# Patient Record
Sex: Female | Born: 1950 | Race: Black or African American | Hispanic: No | Marital: Single | Smoking: Never smoker
Health system: Southern US, Community
[De-identification: ages and names within clinical notes are randomized; demographics above are authoritative.]

## PROBLEM LIST (undated history)

## (undated) DIAGNOSIS — M419 Scoliosis, unspecified: Secondary | ICD-10-CM

## (undated) DIAGNOSIS — J302 Other seasonal allergic rhinitis: Secondary | ICD-10-CM

## (undated) DIAGNOSIS — R000 Tachycardia, unspecified: Secondary | ICD-10-CM

## (undated) DIAGNOSIS — J45909 Unspecified asthma, uncomplicated: Secondary | ICD-10-CM

## (undated) DIAGNOSIS — E785 Hyperlipidemia, unspecified: Secondary | ICD-10-CM

## (undated) DIAGNOSIS — U071 COVID-19: Secondary | ICD-10-CM

## (undated) DIAGNOSIS — I10 Essential (primary) hypertension: Secondary | ICD-10-CM

## (undated) DIAGNOSIS — M549 Dorsalgia, unspecified: Secondary | ICD-10-CM

## (undated) DIAGNOSIS — I471 Supraventricular tachycardia, unspecified: Secondary | ICD-10-CM

## (undated) DIAGNOSIS — R51 Headache: Secondary | ICD-10-CM

## (undated) DIAGNOSIS — K219 Gastro-esophageal reflux disease without esophagitis: Secondary | ICD-10-CM

## (undated) DIAGNOSIS — M75102 Unspecified rotator cuff tear or rupture of left shoulder, not specified as traumatic: Secondary | ICD-10-CM

## (undated) DIAGNOSIS — R519 Headache, unspecified: Secondary | ICD-10-CM

## (undated) DIAGNOSIS — T7840XA Allergy, unspecified, initial encounter: Secondary | ICD-10-CM

## (undated) DIAGNOSIS — G8929 Other chronic pain: Secondary | ICD-10-CM

## (undated) HISTORY — PX: COLONOSCOPY: SHX174

## (undated) HISTORY — DX: Gastro-esophageal reflux disease without esophagitis: K21.9

## (undated) HISTORY — DX: Hyperlipidemia, unspecified: E78.5

## (undated) HISTORY — PX: CHOLECYSTECTOMY: SHX55

## (undated) HISTORY — DX: Supraventricular tachycardia, unspecified: I47.10

## (undated) HISTORY — DX: Supraventricular tachycardia: I47.1

## (undated) HISTORY — DX: Other seasonal allergic rhinitis: J30.2

## (undated) HISTORY — DX: Scoliosis, unspecified: M41.9

## (undated) HISTORY — DX: Allergy, unspecified, initial encounter: T78.40XA

## (undated) HISTORY — PX: POLYPECTOMY: SHX149

## (undated) HISTORY — PX: WISDOM TOOTH EXTRACTION: SHX21

## (undated) HISTORY — PX: TUBAL LIGATION: SHX77

## (undated) HISTORY — PX: ABDOMINAL HYSTERECTOMY: SHX81

---

## 1981-07-03 HISTORY — PX: TUBAL LIGATION: SHX77

## 1983-07-04 HISTORY — PX: ABDOMINAL HYSTERECTOMY: SHX81

## 1984-07-03 HISTORY — PX: CHOLECYSTECTOMY: SHX55

## 1998-03-15 ENCOUNTER — Encounter: Admission: RE | Admit: 1998-03-15 | Discharge: 1998-03-15 | Payer: Self-pay | Admitting: *Deleted

## 1999-07-22 ENCOUNTER — Encounter: Payer: Self-pay | Admitting: Emergency Medicine

## 1999-07-22 ENCOUNTER — Emergency Department (HOSPITAL_COMMUNITY): Admission: EM | Admit: 1999-07-22 | Discharge: 1999-07-22 | Payer: Self-pay | Admitting: Emergency Medicine

## 1999-10-01 ENCOUNTER — Emergency Department (HOSPITAL_COMMUNITY): Admission: EM | Admit: 1999-10-01 | Discharge: 1999-10-01 | Payer: Self-pay | Admitting: Emergency Medicine

## 1999-12-26 ENCOUNTER — Emergency Department (HOSPITAL_COMMUNITY): Admission: EM | Admit: 1999-12-26 | Discharge: 1999-12-26 | Payer: Self-pay | Admitting: Emergency Medicine

## 2000-08-05 ENCOUNTER — Encounter: Payer: Self-pay | Admitting: Emergency Medicine

## 2000-08-05 ENCOUNTER — Emergency Department (HOSPITAL_COMMUNITY): Admission: EM | Admit: 2000-08-05 | Discharge: 2000-08-05 | Payer: Self-pay | Admitting: Emergency Medicine

## 2001-03-11 ENCOUNTER — Emergency Department (HOSPITAL_COMMUNITY): Admission: EM | Admit: 2001-03-11 | Discharge: 2001-03-11 | Payer: Self-pay | Admitting: Emergency Medicine

## 2001-03-11 ENCOUNTER — Encounter: Payer: Self-pay | Admitting: Emergency Medicine

## 2002-07-16 ENCOUNTER — Other Ambulatory Visit: Admission: RE | Admit: 2002-07-16 | Discharge: 2002-07-16 | Payer: Self-pay | Admitting: Family Medicine

## 2007-11-07 ENCOUNTER — Emergency Department (HOSPITAL_COMMUNITY): Admission: EM | Admit: 2007-11-07 | Discharge: 2007-11-07 | Payer: Self-pay | Admitting: Emergency Medicine

## 2007-11-08 ENCOUNTER — Emergency Department (HOSPITAL_COMMUNITY): Admission: EM | Admit: 2007-11-08 | Discharge: 2007-11-08 | Payer: Self-pay | Admitting: Emergency Medicine

## 2007-11-20 ENCOUNTER — Ambulatory Visit (HOSPITAL_COMMUNITY): Admission: RE | Admit: 2007-11-20 | Discharge: 2007-11-20 | Payer: Self-pay | Admitting: Chiropractic Medicine

## 2007-12-25 ENCOUNTER — Encounter: Admission: RE | Admit: 2007-12-25 | Discharge: 2007-12-25 | Payer: Self-pay | Admitting: Chiropractic Medicine

## 2007-12-26 ENCOUNTER — Encounter: Admission: RE | Admit: 2007-12-26 | Discharge: 2007-12-26 | Payer: Self-pay | Admitting: Chiropractic Medicine

## 2008-01-16 ENCOUNTER — Ambulatory Visit (HOSPITAL_COMMUNITY): Admission: RE | Admit: 2008-01-16 | Discharge: 2008-01-16 | Payer: Self-pay | Admitting: Chiropractic Medicine

## 2008-04-05 ENCOUNTER — Emergency Department (HOSPITAL_COMMUNITY): Admission: EM | Admit: 2008-04-05 | Discharge: 2008-04-06 | Payer: Self-pay | Admitting: Emergency Medicine

## 2008-05-07 ENCOUNTER — Emergency Department (HOSPITAL_COMMUNITY): Admission: EM | Admit: 2008-05-07 | Discharge: 2008-05-07 | Payer: Self-pay | Admitting: *Deleted

## 2008-08-09 ENCOUNTER — Emergency Department (HOSPITAL_COMMUNITY): Admission: EM | Admit: 2008-08-09 | Discharge: 2008-08-09 | Payer: Self-pay | Admitting: Emergency Medicine

## 2008-12-21 ENCOUNTER — Ambulatory Visit (HOSPITAL_COMMUNITY): Admission: RE | Admit: 2008-12-21 | Discharge: 2008-12-21 | Payer: Self-pay | Admitting: Internal Medicine

## 2009-04-30 ENCOUNTER — Emergency Department (HOSPITAL_COMMUNITY): Admission: EM | Admit: 2009-04-30 | Discharge: 2009-04-30 | Payer: Self-pay | Admitting: Family Medicine

## 2009-10-05 ENCOUNTER — Emergency Department (HOSPITAL_COMMUNITY): Admission: EM | Admit: 2009-10-05 | Discharge: 2009-10-05 | Payer: Self-pay | Admitting: Emergency Medicine

## 2009-10-16 ENCOUNTER — Emergency Department (HOSPITAL_COMMUNITY): Admission: EM | Admit: 2009-10-16 | Discharge: 2009-10-16 | Payer: Self-pay | Admitting: Emergency Medicine

## 2009-12-27 ENCOUNTER — Ambulatory Visit (HOSPITAL_COMMUNITY): Admission: RE | Admit: 2009-12-27 | Discharge: 2009-12-27 | Payer: Self-pay | Admitting: Internal Medicine

## 2010-03-18 ENCOUNTER — Ambulatory Visit: Payer: Self-pay | Admitting: Family Medicine

## 2010-03-18 LAB — CONVERTED CEMR LAB
ALT: 15 units/L (ref 0–35)
AST: 13 units/L (ref 0–37)
Albumin: 4.4 g/dL (ref 3.5–5.2)
Alkaline Phosphatase: 129 units/L — ABNORMAL HIGH (ref 39–117)
BUN: 10 mg/dL (ref 6–23)
Basophils Absolute: 0 10*3/uL (ref 0.0–0.1)
Basophils Relative: 0 % (ref 0–1)
CO2: 29 meq/L (ref 19–32)
Calcium: 9.7 mg/dL (ref 8.4–10.5)
Chloride: 105 meq/L (ref 96–112)
Cholesterol: 192 mg/dL (ref 0–200)
Creatinine, Ser: 0.65 mg/dL (ref 0.40–1.20)
Eosinophils Absolute: 0.1 10*3/uL (ref 0.0–0.7)
Eosinophils Relative: 1 % (ref 0–5)
Glucose, Bld: 84 mg/dL (ref 70–99)
HCT: 39.5 % (ref 36.0–46.0)
HDL: 38 mg/dL — ABNORMAL LOW (ref >39)
Hemoglobin: 12.4 g/dL (ref 12.0–15.0)
LDL Cholesterol: 121 mg/dL — ABNORMAL HIGH (ref 0–99)
Lymphocytes Relative: 29 % (ref 12–46)
Lymphs Abs: 2 10*3/uL (ref 0.7–4.0)
MCHC: 31.4 g/dL (ref 30.0–36.0)
MCV: 90.8 fL (ref 78.0–100.0)
Monocytes Absolute: 0.5 10*3/uL (ref 0.1–1.0)
Monocytes Relative: 7 % (ref 3–12)
Neutro Abs: 4.4 10*3/uL (ref 1.7–7.7)
Neutrophils Relative %: 63 % (ref 43–77)
Platelets: 237 10*3/uL (ref 150–400)
Potassium: 4.6 meq/L (ref 3.5–5.3)
RBC: 4.35 M/uL (ref 3.87–5.11)
RDW: 14.2 % (ref 11.5–15.5)
Sodium: 143 meq/L (ref 135–145)
Total Bilirubin: 0.3 mg/dL (ref 0.3–1.2)
Total CHOL/HDL Ratio: 5.1
Total Protein: 7.3 g/dL (ref 6.0–8.3)
Triglycerides: 163 mg/dL — ABNORMAL HIGH (ref <150)
VLDL: 33 mg/dL (ref 0–40)
WBC: 7 10*3/uL (ref 4.0–10.5)

## 2010-03-25 ENCOUNTER — Ambulatory Visit (HOSPITAL_COMMUNITY): Admission: RE | Admit: 2010-03-25 | Discharge: 2010-03-25 | Payer: Self-pay | Admitting: Family Medicine

## 2010-05-13 ENCOUNTER — Ambulatory Visit (HOSPITAL_COMMUNITY)
Admission: RE | Admit: 2010-05-13 | Discharge: 2010-05-13 | Payer: Self-pay | Source: Home / Self Care | Admitting: Family Medicine

## 2010-10-18 LAB — POCT CARDIAC MARKERS
CKMB, poc: <1 ng/mL — ABNORMAL LOW (ref 1.0–8.0)
Myoglobin, poc: 84.4 ng/mL (ref 12–200)
Troponin i, poc: <0.05 ng/mL (ref 0.00–0.09)

## 2010-10-18 LAB — POCT I-STAT, CHEM 8
BUN: 8 mg/dL (ref 6–23)
Calcium, Ion: 1.17 mmol/L (ref 1.12–1.32)
Chloride: 103 mEq/L (ref 96–112)
Creatinine, Ser: 0.8 mg/dL (ref 0.4–1.2)
Glucose, Bld: 94 mg/dL (ref 70–99)
HCT: 39 % (ref 36.0–46.0)
Hemoglobin: 13.3 g/dL (ref 12.0–15.0)
Potassium: 4 mEq/L (ref 3.5–5.1)
Sodium: 142 mEq/L (ref 135–145)
TCO2: 30 mmol/L (ref 0–100)

## 2010-10-18 LAB — DIFFERENTIAL
Basophils Absolute: 0 10*3/uL (ref 0.0–0.1)
Basophils Relative: 0 % (ref 0–1)
Eosinophils Absolute: 0.2 10*3/uL (ref 0.0–0.7)
Eosinophils Relative: 2 % (ref 0–5)
Lymphocytes Relative: 26 % (ref 12–46)
Lymphs Abs: 2.9 10*3/uL (ref 0.7–4.0)
Monocytes Absolute: 0.8 10*3/uL (ref 0.1–1.0)
Monocytes Relative: 7 % (ref 3–12)
Neutro Abs: 7.3 10*3/uL (ref 1.7–7.7)
Neutrophils Relative %: 65 % (ref 43–77)

## 2010-10-18 LAB — CBC
HCT: 38.1 % (ref 36.0–46.0)
Hemoglobin: 12.9 g/dL (ref 12.0–15.0)
MCHC: 33.9 g/dL (ref 30.0–36.0)
MCV: 87.6 fL (ref 78.0–100.0)
Platelets: 228 10*3/uL (ref 150–400)
RBC: 4.35 MIL/uL (ref 3.87–5.11)
RDW: 13.2 % (ref 11.5–15.5)
WBC: 11.2 10*3/uL — ABNORMAL HIGH (ref 4.0–10.5)

## 2010-10-18 LAB — D-DIMER, QUANTITATIVE: D-Dimer, Quant: 0.32 ug/mL-FEU (ref 0.00–0.48)

## 2010-11-25 ENCOUNTER — Other Ambulatory Visit (HOSPITAL_COMMUNITY): Payer: Self-pay | Admitting: Family Medicine

## 2010-11-25 DIAGNOSIS — Z1231 Encounter for screening mammogram for malignant neoplasm of breast: Secondary | ICD-10-CM

## 2010-12-29 ENCOUNTER — Ambulatory Visit (HOSPITAL_COMMUNITY)
Admission: RE | Admit: 2010-12-29 | Discharge: 2010-12-29 | Disposition: A | Discharge status: Home / Self Care | Payer: Self-pay | Source: Ambulatory Visit | Attending: Family Medicine | Admitting: Family Medicine

## 2010-12-29 ENCOUNTER — Ambulatory Visit (HOSPITAL_COMMUNITY): Payer: Self-pay

## 2010-12-29 DIAGNOSIS — Z1231 Encounter for screening mammogram for malignant neoplasm of breast: Secondary | ICD-10-CM | POA: Insufficient documentation

## 2011-02-01 ENCOUNTER — Ambulatory Visit (HOSPITAL_COMMUNITY)
Admission: RE | Admit: 2011-02-01 | Discharge: 2011-02-01 | Disposition: A | Discharge status: Home / Self Care | Payer: Self-pay | Source: Ambulatory Visit | Attending: Gastroenterology | Admitting: Gastroenterology

## 2011-02-01 ENCOUNTER — Other Ambulatory Visit: Payer: Self-pay | Admitting: Gastroenterology

## 2011-02-01 DIAGNOSIS — R109 Unspecified abdominal pain: Secondary | ICD-10-CM | POA: Insufficient documentation

## 2011-02-01 DIAGNOSIS — Z83719 Family history of colon polyps, unspecified: Secondary | ICD-10-CM | POA: Insufficient documentation

## 2011-02-01 DIAGNOSIS — Z8371 Family history of colonic polyps: Secondary | ICD-10-CM | POA: Insufficient documentation

## 2011-02-01 DIAGNOSIS — K644 Residual hemorrhoidal skin tags: Secondary | ICD-10-CM | POA: Insufficient documentation

## 2011-02-01 DIAGNOSIS — D126 Benign neoplasm of colon, unspecified: Secondary | ICD-10-CM | POA: Insufficient documentation

## 2011-02-01 DIAGNOSIS — R195 Other fecal abnormalities: Secondary | ICD-10-CM | POA: Insufficient documentation

## 2011-04-05 LAB — BASIC METABOLIC PANEL
BUN: 8
CO2: 27
Calcium: 9.3
Chloride: 103
Creatinine, Ser: 0.73
GFR calc Af Amer: >60
GFR calc non Af Amer: >60
Glucose, Bld: 106 — ABNORMAL HIGH
Potassium: 3.3 — ABNORMAL LOW
Sodium: 139

## 2011-04-05 LAB — DIFFERENTIAL
Basophils Absolute: 0
Basophils Relative: 0
Eosinophils Absolute: 0.3
Eosinophils Relative: 3
Lymphocytes Relative: 30
Lymphs Abs: 2.6
Monocytes Absolute: 0.5
Monocytes Relative: 6
Neutro Abs: 5.1
Neutrophils Relative %: 61

## 2011-04-05 LAB — CBC
HCT: 36.8
Hemoglobin: 12.7
MCHC: 34.4
MCV: 86.8
Platelets: ADEQUATE
RBC: 4.24
RDW: 13.9
WBC: 8.5

## 2011-04-05 LAB — POCT CARDIAC MARKERS
CKMB, poc: 1.1
CKMB, poc: <1 — ABNORMAL LOW
Myoglobin, poc: 49.5
Myoglobin, poc: 68.9
Troponin i, poc: <0.05
Troponin i, poc: <0.05

## 2011-11-21 ENCOUNTER — Other Ambulatory Visit (HOSPITAL_COMMUNITY): Payer: Self-pay | Admitting: Family Medicine

## 2011-11-21 DIAGNOSIS — Z1231 Encounter for screening mammogram for malignant neoplasm of breast: Secondary | ICD-10-CM

## 2012-01-08 ENCOUNTER — Ambulatory Visit (HOSPITAL_COMMUNITY): Payer: Self-pay

## 2012-01-19 ENCOUNTER — Other Ambulatory Visit (HOSPITAL_COMMUNITY): Payer: Self-pay | Admitting: Family Medicine

## 2012-01-19 ENCOUNTER — Ambulatory Visit (HOSPITAL_COMMUNITY)
Admission: RE | Admit: 2012-01-19 | Discharge: 2012-01-19 | Disposition: A | Discharge status: Home / Self Care | Payer: Self-pay | Source: Ambulatory Visit | Attending: Family Medicine | Admitting: Family Medicine

## 2012-01-19 DIAGNOSIS — Z1231 Encounter for screening mammogram for malignant neoplasm of breast: Secondary | ICD-10-CM | POA: Insufficient documentation

## 2012-02-25 ENCOUNTER — Emergency Department (HOSPITAL_COMMUNITY)
Admission: EM | Admit: 2012-02-25 | Discharge: 2012-02-25 | Disposition: A | Discharge status: Home / Self Care | Payer: Self-pay | Attending: Emergency Medicine | Admitting: Emergency Medicine

## 2012-02-25 ENCOUNTER — Emergency Department (HOSPITAL_COMMUNITY): Payer: Self-pay

## 2012-02-25 ENCOUNTER — Encounter (HOSPITAL_COMMUNITY): Payer: Self-pay | Admitting: Emergency Medicine

## 2012-02-25 DIAGNOSIS — R071 Chest pain on breathing: Secondary | ICD-10-CM | POA: Insufficient documentation

## 2012-02-25 DIAGNOSIS — M79609 Pain in unspecified limb: Secondary | ICD-10-CM | POA: Insufficient documentation

## 2012-02-25 DIAGNOSIS — R0789 Other chest pain: Secondary | ICD-10-CM

## 2012-02-25 DIAGNOSIS — R000 Tachycardia, unspecified: Secondary | ICD-10-CM | POA: Insufficient documentation

## 2012-02-25 DIAGNOSIS — M549 Dorsalgia, unspecified: Secondary | ICD-10-CM | POA: Insufficient documentation

## 2012-02-25 HISTORY — DX: Tachycardia, unspecified: R00.0

## 2012-02-25 LAB — BASIC METABOLIC PANEL
BUN: 9 mg/dL (ref 6–23)
CO2: 28 mEq/L (ref 19–32)
Calcium: 9.7 mg/dL (ref 8.4–10.5)
Chloride: 103 mEq/L (ref 96–112)
Creatinine, Ser: 0.56 mg/dL (ref 0.50–1.10)
GFR calc Af Amer: >90 mL/min (ref >90)
GFR calc non Af Amer: >90 mL/min (ref >90)
Glucose, Bld: 95 mg/dL (ref 70–99)
Potassium: 3.5 mEq/L (ref 3.5–5.1)
Sodium: 142 mEq/L (ref 135–145)

## 2012-02-25 LAB — CBC
HCT: 38.6 % (ref 36.0–46.0)
Hemoglobin: 12.7 g/dL (ref 12.0–15.0)
MCH: 28.4 pg (ref 26.0–34.0)
MCHC: 32.9 g/dL (ref 30.0–36.0)
MCV: 86.4 fL (ref 78.0–100.0)
Platelets: 256 10*3/uL (ref 150–400)
RBC: 4.47 MIL/uL (ref 3.87–5.11)
RDW: 13.7 % (ref 11.5–15.5)
WBC: 7.2 10*3/uL (ref 4.0–10.5)

## 2012-02-25 LAB — POCT I-STAT TROPONIN I: Troponin i, poc: 0 ng/mL (ref 0.00–0.08)

## 2012-02-25 MED ORDER — HYDROCODONE-ACETAMINOPHEN 5-325 MG PO TABS: 2.0000 | ORAL_TABLET | Freq: Four times a day (QID) | ORAL | Status: AC | PRN

## 2012-02-25 NOTE — ED Provider Notes (Signed)
History     CSN: [id number]  Arrival date & time 02/25/12  1059   First MD Initiated Contact with Patient 02/25/12 1358      Chief Complaint  Patient presents with  . Chest Pain  . Arm Pain  . Back Pain    (Consider location/radiation/quality/duration/timing/severity/associated sxs/prior treatment) HPI This 61 year old female has several weeks of low back pain nonradiating without associated weakness numbness or change in bowel or bladder function. She now presents to the emergency department with a few days of constant 24-hour a day right chest wall pain and tenderness worse with palpation and torso movement which is nonexertional and nonpleuritic without cough or shortness of breath. She is no fever or trauma or rash. She also has a couple of weeks of constant 24 hour day right arm pain without weakness or numbness redness swelling or deformity. The pain is not worse with movement.  She is no headache no change in speech vision swallowing or understanding and no lateralizing weakness numbness. She is no abdominal pain vomiting diarrhea or bloody stools. Her chest pain is sharp as well as pressure-like nonradiating and without associated symptoms. Past Medical History  Diagnosis Date  . Tachycardia     History reviewed. No pertinent past surgical history.  History reviewed. No pertinent family history.  History  Substance Use Topics  . Smoking status: Never Smoker   . Smokeless tobacco: Not on file  . Alcohol Use: No    OB History    Grav Para Term Preterm Abortions TAB SAB Ect Mult Living                  Review of Systems 10 Systems reviewed and are negative for acute change except as noted in the HPI. Allergies  Review of patient's allergies indicates no known allergies.  Home Medications   Current Outpatient Rx  Name Route Sig Dispense Refill  . HYDROCHLOROTHIAZIDE 25 MG PO TABS Oral Take 25 mg by mouth daily.    Marland Kitchen METOPROLOL SUCCINATE ER 50 MG PO TB24 Oral  Take 25 mg by mouth daily. Take with or immediately following a meal.    . HYDROCODONE-ACETAMINOPHEN 5-325 MG PO TABS Oral Take 2 tablets by mouth every 6 (six) hours as needed for pain. 15 tablet 0    BP 150/101  Pulse 67  Temp 97.8 F (36.6 C) (Oral)  Resp 21  SpO2 100%  Physical Exam  Nursing note and vitals reviewed. Constitutional:       Awake, alert, nontoxic appearance with baseline speech for patient.  HENT:  Head: Atraumatic.  Mouth/Throat: No oropharyngeal exudate.  Eyes: EOM are normal. Pupils are equal, round, and reactive to light. Right eye exhibits no discharge. Left eye exhibits no discharge.  Neck: Neck supple.  Cardiovascular: Normal rate and regular rhythm.   No murmur heard. Pulmonary/Chest: Effort normal and breath sounds normal. No stridor. No respiratory distress. She has no wheezes. She has no rales. She exhibits tenderness.       Reproducible right lateral chest wall tenderness without deformity  Abdominal: Soft. Bowel sounds are normal. She exhibits no mass. There is no tenderness. There is no rebound.  Musculoskeletal: She exhibits no edema and no tenderness.       Baseline ROM, moves extremities with no obvious new focal weakness. Minimal bilateral paralumbar tenderness without midline back tenderness.  Lymphadenopathy:    She has no cervical adenopathy.  Neurological:       Awake, alert, cooperative and aware of  situation; motor strength bilaterally; sensation normal to light touch bilaterally; peripheral visual fields full to confrontation; no facial asymmetry; tongue midline; major cranial nerves appear intact; no pronator drift, normal finger to nose bilaterally  Skin: No rash noted.  Psychiatric: She has a normal mood and affect.    ED Course  Procedures (including critical care time)   Labs Reviewed  CBC  BASIC METABOLIC PANEL  POCT I-STAT TROPONIN I  LAB REPORT - SCANNED   No results found.   1. Chest wall pain       MDM  I  doubt any other EMC precluding discharge at this time including, but not necessarily limited to the following:ACS, PE.        Hurman Horn, MD 02/29/12 1110

## 2012-02-25 NOTE — ED Notes (Signed)
Pt c/o midsternal CP and pain in mid and lower back on right side all worse with palpation; pt sts pain in right arm all going on x 2 days

## 2012-07-03 HISTORY — PX: BREAST CYST ASPIRATION: SHX578

## 2012-08-28 ENCOUNTER — Emergency Department (HOSPITAL_COMMUNITY)
Admission: EM | Admit: 2012-08-28 | Discharge: 2012-08-28 | Disposition: A | Discharge status: Home / Self Care | Payer: Worker's Compensation | Attending: Emergency Medicine | Admitting: Emergency Medicine

## 2012-08-28 ENCOUNTER — Encounter (HOSPITAL_COMMUNITY): Payer: Self-pay | Admitting: Emergency Medicine

## 2012-08-28 DIAGNOSIS — T07XXXA Unspecified multiple injuries, initial encounter: Secondary | ICD-10-CM

## 2012-08-28 DIAGNOSIS — W010XXA Fall on same level from slipping, tripping and stumbling without subsequent striking against object, initial encounter: Secondary | ICD-10-CM | POA: Insufficient documentation

## 2012-08-28 DIAGNOSIS — Y9301 Activity, walking, marching and hiking: Secondary | ICD-10-CM | POA: Insufficient documentation

## 2012-08-28 DIAGNOSIS — Z8679 Personal history of other diseases of the circulatory system: Secondary | ICD-10-CM | POA: Insufficient documentation

## 2012-08-28 DIAGNOSIS — S0003XA Contusion of scalp, initial encounter: Secondary | ICD-10-CM | POA: Insufficient documentation

## 2012-08-28 DIAGNOSIS — Y9269 Other specified industrial and construction area as the place of occurrence of the external cause: Secondary | ICD-10-CM | POA: Insufficient documentation

## 2012-08-28 DIAGNOSIS — Z79899 Other long term (current) drug therapy: Secondary | ICD-10-CM | POA: Insufficient documentation

## 2012-08-28 DIAGNOSIS — Z23 Encounter for immunization: Secondary | ICD-10-CM | POA: Insufficient documentation

## 2012-08-28 DIAGNOSIS — S0083XA Contusion of other part of head, initial encounter: Secondary | ICD-10-CM | POA: Insufficient documentation

## 2012-08-28 DIAGNOSIS — IMO0002 Reserved for concepts with insufficient information to code with codable children: Secondary | ICD-10-CM | POA: Insufficient documentation

## 2012-08-28 DIAGNOSIS — Y990 Civilian activity done for income or pay: Secondary | ICD-10-CM | POA: Insufficient documentation

## 2012-08-28 MED ORDER — TETANUS-DIPHTH-ACELL PERTUSSIS 5-2.5-18.5 LF-MCG/0.5 IM SUSP
0.5000 mL | Freq: Once | INTRAMUSCULAR | Status: AC
  Administered 2012-08-28: 0.5 mL via INTRAMUSCULAR
  Filled 2012-08-28: qty 0.5

## 2012-08-28 NOTE — ED Notes (Signed)
Fell at work this am-- tripped over feet-- no loss of consciousness-- hit forehead, abrasion to left eyebrow, left nasal area, left side of lip, left knee-

## 2012-08-28 NOTE — ED Provider Notes (Signed)
History  This chart was scribed for non-physician practitioner working with Richardean Canal, MD by Ardeen Jourdain, ED Scribe. This patient was seen in room TR06C/TR06C and the patient's care was started at 1620.  CSN: [id number]  Arrival date & time 08/28/12  1547   None     Chief Complaint  Patient presents with  . Fall     Patient is a 62 y.o. female presenting with fall. The history is provided by the patient. No language interpreter was used.  Fall The accident occurred 3 to 5 hours ago. The fall occurred while walking. She fell from a height of 1 to 2 ft. She landed on a hard floor. There was no blood loss. The point of impact was the head and left knee. The pain is present in the head and left knee. The pain is mild. She was ambulatory at the scene. There was no entrapment after the fall. There was no drug use involved in the accident. There was no alcohol use involved in the accident. Pertinent negatives include no visual change, no fever, no abdominal pain, no bowel incontinence, no nausea, no vomiting, no headaches, no loss of consciousness and no tingling. She has tried nothing for the symptoms.    Emily Clarke is a 62 y.o. female with a h/o tachycardia who presents to the Emergency Department complaining of a fall at work. She states she "tripped over her feet" and hit a wall. She is c/o abrasions to the left eyebrow, left nasal area, left lip, and left knee. She states she is unsure of her last tetanus vaccination.    Past Medical History  Diagnosis Date  . Tachycardia     Past Surgical History  Procedure Laterality Date  . Abdominal hysterectomy    . Cholecystectomy      No family history on file.  History  Substance Use Topics  . Smoking status: Never Smoker   . Smokeless tobacco: Not on file  . Alcohol Use: No   No OB history available.   Review of Systems  Constitutional: Negative for fever.  Gastrointestinal: Negative for nausea, vomiting, abdominal pain  and bowel incontinence.  Skin: Positive for wound.  Neurological: Negative for tingling, loss of consciousness and headaches.  All other systems reviewed and are negative.    Allergies  Review of patient's allergies indicates no known allergies.  Home Medications   Current Outpatient Rx  Name  Route  Sig  Dispense  Refill  . hydrochlorothiazide (HYDRODIURIL) 25 MG tablet   Oral   Take 12.5 mg by mouth daily.          . metoprolol succinate (TOPROL-XL) 25 MG 24 hr tablet   Oral   Take 25 mg by mouth daily.         Marland Kitchen Phenyleph-CPM-DM-Aspirin (ALKA-SELTZER PLUS COLD & COUGH PO)   Oral   Take 2 tablets by mouth daily as needed (cold like symptoms).           Triage Vitals: BP 131/90  Pulse 83  Temp(Src) 98.3 F (36.8 C) (Oral)  Resp 18  SpO2 96%  Physical Exam  Nursing note and vitals reviewed. Constitutional: She is oriented to person, place, and time. She appears well-developed and well-nourished. No distress.  HENT:  Head: Normocephalic and atraumatic.  Right Ear: External ear normal.  Left Ear: External ear normal.  Eyes: EOM are normal. Pupils are equal, round, and reactive to light.  Neck: Normal range of motion. Neck supple. No  tracheal deviation present.  Cardiovascular: Normal rate, regular rhythm and normal heart sounds.   Pulmonary/Chest: Effort normal and breath sounds normal. No respiratory distress. She exhibits no tenderness.  Abdominal: Soft. She exhibits no distension. There is no tenderness.  Musculoskeletal: Normal range of motion. She exhibits no edema.  Neurological: She is alert and oriented to person, place, and time.  Skin: Skin is warm and dry. She is not diaphoretic.  Superficial abrasions to left eyebrow and left nares, contusion to left lower lip, superficial abrasion to left knee  Psychiatric: She has a normal mood and affect. Her behavior is normal.    ED Course  Procedures (including critical care time)  DIAGNOSTIC  STUDIES: Oxygen Saturation is 96% on room air, adequate by my interpretation.    COORDINATION OF CARE:  4:33 PM: Discussed treatment plan which includes ice and home care with pt at bedside and pt agreed to plan.     Labs Reviewed - No data to display No results found.   No diagnosis found.  Fall from a stumble at work.  No loss of consciousness or dizziness.  Multiple abrasions.  Last tetanus unknown, updated today.  MDM        I personally performed the services described in this documentation, which was scribed in my presence. The recorded information has been reviewed and is accurate.    Jimmye Norman, NP 08/28/12 1650

## 2012-08-28 NOTE — ED Notes (Signed)
Pt presents for evaluation of a fall that happened around 11am today, pt tripped over feet and fell onto concrete.  Denies LOC.  Abrasion noted above left eye, bleeding controlled.  Abrasion noted to left knee, bleeding controlled.  Pt able to move all extremities well and answering questions appropriately.  Will continue to monitor.

## 2012-08-28 NOTE — ED Provider Notes (Signed)
Medical screening examination/treatment/procedure(s) were performed by non-physician practitioner and as supervising physician I was immediately available for consultation/collaboration.   Ahmani Daoud H Johann Gascoigne, MD 08/28/12 2244 

## 2012-09-15 ENCOUNTER — Encounter (HOSPITAL_COMMUNITY): Payer: Self-pay | Admitting: Family Medicine

## 2012-09-15 ENCOUNTER — Emergency Department (HOSPITAL_COMMUNITY)
Admission: EM | Admit: 2012-09-15 | Discharge: 2012-09-15 | Disposition: A | Discharge status: Home / Self Care | Payer: Self-pay | Attending: Emergency Medicine | Admitting: Emergency Medicine

## 2012-09-15 DIAGNOSIS — Z79899 Other long term (current) drug therapy: Secondary | ICD-10-CM | POA: Insufficient documentation

## 2012-09-15 DIAGNOSIS — Z8679 Personal history of other diseases of the circulatory system: Secondary | ICD-10-CM | POA: Insufficient documentation

## 2012-09-15 DIAGNOSIS — R059 Cough, unspecified: Secondary | ICD-10-CM | POA: Insufficient documentation

## 2012-09-15 DIAGNOSIS — Z7982 Long term (current) use of aspirin: Secondary | ICD-10-CM | POA: Insufficient documentation

## 2012-09-15 DIAGNOSIS — R21 Rash and other nonspecific skin eruption: Secondary | ICD-10-CM | POA: Insufficient documentation

## 2012-09-15 DIAGNOSIS — B029 Zoster without complications: Secondary | ICD-10-CM | POA: Insufficient documentation

## 2012-09-15 MED ORDER — ACYCLOVIR 800 MG PO TABS: 400.0000 mg | ORAL_TABLET | Freq: Every day | ORAL | Status: DC

## 2012-09-15 MED ORDER — HYDROCODONE-ACETAMINOPHEN 5-325 MG PO TABS: 1.0000 | ORAL_TABLET | ORAL | Status: DC | PRN

## 2012-09-15 NOTE — ED Provider Notes (Signed)
Medical screening examination/treatment/procedure(s) were performed by non-physician practitioner and as supervising physician I was immediately available for consultation/collaboration.  Lauralye Kinn L Anika Shore, MD 09/15/12 1702 

## 2012-09-15 NOTE — ED Notes (Signed)
Per pt sts right side of face pain and swelling. sts started with a cold sore. sts also cough. sts hurts to swollow

## 2012-09-15 NOTE — ED Provider Notes (Signed)
History     CSN: [id number]  Arrival date & time 09/15/12  0945   None     Chief Complaint  Patient presents with  . Facial Swelling    (Consider location/radiation/quality/duration/timing/severity/associated sxs/prior treatment) HPI  Patient presents to the emergency department with complaints of pain to right side of face with rash. She feels that she is having swelling and a cold sore. She admits to having a cough in the past 2 days as well and pain going towards her ear and throat. No neck pain, no fevers, weight loss, N/V/D, SOB, CP. She is a [facility] patient and has not been seeing her PCP because she does not have insurance.   Past Medical History  Diagnosis Date  . Tachycardia     Past Surgical History  Procedure Laterality Date  . Abdominal hysterectomy    . Cholecystectomy      History reviewed. No pertinent family history.  History  Substance Use Topics  . Smoking status: Never Smoker   . Smokeless tobacco: Not on file  . Alcohol Use: No    OB History   Grav Para Term Preterm Abortions TAB SAB Ect Mult Living                  Review of Systems  All other systems reviewed and are negative.    Allergies  Review of patient's allergies indicates no known allergies.  Home Medications   Current Outpatient Rx  Name  Route  Sig  Dispense  Refill  . aspirin EC 81 MG tablet   Oral   Take 81 mg by mouth daily.         . hydrochlorothiazide (HYDRODIURIL) 25 MG tablet   Oral   Take 12.5 mg by mouth daily.          . metoprolol succinate (TOPROL-XL) 25 MG 24 hr tablet   Oral   Take 25 mg by mouth daily.         Marland Kitchen acyclovir (ZOVIRAX) 800 MG tablet   Oral   Take 0.5 tablets (400 mg total) by mouth 5 (five) times daily.   35 tablet   0   . HYDROcodone-acetaminophen (NORCO/VICODIN) 5-325 MG per tablet   Oral   Take 1 tablet by mouth every 4 (four) hours as needed for pain.   10 tablet   0     BP 141/83  Pulse 82  Temp(Src) 97.1 F  (36.2 C)  Resp 16  SpO2 100%  Physical Exam  Nursing note and vitals reviewed. Constitutional: She appears well-developed and well-nourished. No distress.  HENT:  Head: Normocephalic and atraumatic. No trismus in the jaw.  Mouth/Throat: Mucous membranes are normal. No oral lesions. No dental abscesses, edematous, lacerations or dental caries. No oropharyngeal exudate, posterior oropharyngeal edema, posterior oropharyngeal erythema or tonsillar abscesses.    Eyes: Pupils are equal, round, and reactive to light.  Neck: Normal range of motion. Neck supple.  Cardiovascular: Normal rate and regular rhythm.   Pulmonary/Chest: Effort normal.  Abdominal: Soft.  Neurological: She is alert.  Skin: Skin is warm and dry.    ED Course  Procedures (including critical care time)  Labs Reviewed - No data to display No results found.   1. Shingles       MDM  No lesions noted inside mouth or around in the eyes.   Small cluster of vesicles on right lips. No lesions in ear, side of face. NO obvious swelling noted.   Will txf  or shingles and Rx Acyclovir. Given follow-up referrals and number to call case management. Vss.  Pt has been advised of the symptoms that warrant their return to the ED. Patient has voiced understanding and has agreed to follow-up with the PCP or specialist.         Dorthula Matas, PA-C 09/15/12 1128

## 2012-09-19 ENCOUNTER — Encounter (HOSPITAL_COMMUNITY): Payer: Self-pay

## 2012-09-19 ENCOUNTER — Emergency Department (HOSPITAL_COMMUNITY)
Admission: EM | Admit: 2012-09-19 | Discharge: 2012-09-19 | Disposition: A | Discharge status: Home / Self Care | Payer: Self-pay | Source: Home / Self Care | Attending: Family Medicine | Admitting: Family Medicine

## 2012-09-19 DIAGNOSIS — B009 Herpesviral infection, unspecified: Secondary | ICD-10-CM

## 2012-09-19 DIAGNOSIS — B001 Herpesviral vesicular dermatitis: Secondary | ICD-10-CM

## 2012-09-19 MED ORDER — HYDROCHLOROTHIAZIDE 25 MG PO TABS: 12.5000 mg | ORAL_TABLET | Freq: Every day | ORAL | Status: DC

## 2012-09-19 MED ORDER — METOPROLOL SUCCINATE ER 25 MG PO TB24: 25.0000 mg | ORAL_TABLET | Freq: Every day | ORAL | Status: DC

## 2012-09-19 NOTE — ED Provider Notes (Addendum)
History     CSN: [id number]  Arrival date & time 09/19/12  1553   First MD Initiated Contact with Patient 09/19/12 1606      Chief Complaint  Patient presents with  . Herpes Zoster    (Consider location/radiation/quality/duration/timing/severity/associated sxs/prior treatment) Patient is a 62 y.o. female presenting with rash. The history is provided by the patient and the spouse.  Rash Location:  Mouth Mouth rash location:  Upper outer lip Quality: peeling and scaling   Severity:  Mild Duration:  4 days Progression:  Improving Context comment:  Seen in ER on 3/16, told shingles, improving with meds. Associated symptoms: no shortness of breath     Past Medical History  Diagnosis Date  . Tachycardia     Past Surgical History  Procedure Laterality Date  . Abdominal hysterectomy    . Cholecystectomy      No family history on file.  History  Substance Use Topics  . Smoking status: Never Smoker   . Smokeless tobacco: Not on file  . Alcohol Use: No    OB History   Grav Para Term Preterm Abortions TAB SAB Ect Mult Living                  Review of Systems  Constitutional: Negative.   Respiratory: Positive for cough. Negative for shortness of breath.   Cardiovascular: Negative for chest pain, palpitations and leg swelling.  Skin: Positive for rash.    Allergies  Review of patient's allergies indicates no known allergies.  Home Medications   Current Outpatient Rx  Name  Route  Sig  Dispense  Refill  . acyclovir (ZOVIRAX) 800 MG tablet   Oral   Take 0.5 tablets (400 mg total) by mouth 5 (five) times daily.   35 tablet   0   . aspirin EC 81 MG tablet   Oral   Take 81 mg by mouth daily.         . hydrochlorothiazide (HYDRODIURIL) 25 MG tablet   Oral   Take 12.5 mg by mouth daily.          . hydrochlorothiazide (HYDRODIURIL) 25 MG tablet   Oral   Take 0.5 tablets (12.5 mg total) by mouth daily.   30 tablet   1   . HYDROcodone-acetaminophen  (NORCO/VICODIN) 5-325 MG per tablet   Oral   Take 1 tablet by mouth every 4 (four) hours as needed for pain.   10 tablet   0   . metoprolol succinate (TOPROL-XL) 25 MG 24 hr tablet   Oral   Take 25 mg by mouth daily.         . metoprolol succinate (TOPROL-XL) 25 MG 24 hr tablet   Oral   Take 1 tablet (25 mg total) by mouth daily.   30 tablet   1     BP 155/93  Pulse 84  Temp(Src) 98 F (36.7 C) (Oral)  SpO2 98%  Physical Exam  Nursing note and vitals reviewed. Constitutional: She is oriented to person, place, and time. She appears well-developed and well-nourished.  HENT:  Head: Normocephalic.  Nose: Nose normal.  Mouth/Throat: Oropharynx is clear and moist. Oral lesions present.    Neck: Normal range of motion. Neck supple.  Cardiovascular: Normal rate, regular rhythm, normal heart sounds and intact distal pulses.   Pulmonary/Chest: Effort normal and breath sounds normal.  Neurological: She is alert and oriented to person, place, and time.  Skin: Skin is warm and dry.  ED Course  Procedures (including critical care time)  Labs Reviewed - No data to display No results found.   1. Herpes simplex labialis       MDM          Linna Hoff, MD 09/19/12 1649  Linna Hoff, MD 09/19/12 [id number]

## 2012-09-19 NOTE — ED Notes (Signed)
Patient states was seen in the ED at [facility] Was diagnosed with shingles

## 2012-10-10 ENCOUNTER — Encounter: Payer: Self-pay | Admitting: Family Medicine

## 2012-10-11 ENCOUNTER — Emergency Department (HOSPITAL_COMMUNITY)
Admission: EM | Admit: 2012-10-11 | Discharge: 2012-10-11 | Disposition: A | Discharge status: Home / Self Care | Payer: Self-pay | Source: Home / Self Care | Attending: Family Medicine | Admitting: Family Medicine

## 2012-10-11 ENCOUNTER — Encounter (HOSPITAL_COMMUNITY): Payer: Self-pay

## 2012-10-11 ENCOUNTER — Emergency Department (INDEPENDENT_AMBULATORY_CARE_PROVIDER_SITE_OTHER): Payer: Self-pay

## 2012-10-11 DIAGNOSIS — R053 Chronic cough: Secondary | ICD-10-CM | POA: Diagnosis present

## 2012-10-11 DIAGNOSIS — R05 Cough: Secondary | ICD-10-CM

## 2012-10-11 DIAGNOSIS — R071 Chest pain on breathing: Secondary | ICD-10-CM

## 2012-10-11 DIAGNOSIS — R059 Cough, unspecified: Secondary | ICD-10-CM

## 2012-10-11 DIAGNOSIS — R0781 Pleurodynia: Secondary | ICD-10-CM | POA: Diagnosis present

## 2012-10-11 DIAGNOSIS — J45909 Unspecified asthma, uncomplicated: Secondary | ICD-10-CM | POA: Diagnosis present

## 2012-10-11 DIAGNOSIS — J309 Allergic rhinitis, unspecified: Secondary | ICD-10-CM | POA: Diagnosis present

## 2012-10-11 DIAGNOSIS — Z8709 Personal history of other diseases of the respiratory system: Secondary | ICD-10-CM

## 2012-10-11 DIAGNOSIS — L600 Ingrowing nail: Secondary | ICD-10-CM | POA: Diagnosis present

## 2012-10-11 LAB — LIPID PANEL
Cholesterol: 195 mg/dL (ref 0–200)
HDL: 38 mg/dL — ABNORMAL LOW (ref >39)
LDL Cholesterol: 123 mg/dL — ABNORMAL HIGH (ref 0–99)
Total CHOL/HDL Ratio: 5.1 RATIO
Triglycerides: 168 mg/dL — ABNORMAL HIGH (ref <150)
VLDL: 34 mg/dL (ref 0–40)

## 2012-10-11 LAB — COMPREHENSIVE METABOLIC PANEL
ALT: 17 U/L (ref 0–35)
AST: 16 U/L (ref 0–37)
Albumin: 3.9 g/dL (ref 3.5–5.2)
Alkaline Phosphatase: 147 U/L — ABNORMAL HIGH (ref 39–117)
BUN: 13 mg/dL (ref 6–23)
CO2: 31 mEq/L (ref 19–32)
Calcium: 10.2 mg/dL (ref 8.4–10.5)
Chloride: 100 mEq/L (ref 96–112)
Creatinine, Ser: 0.71 mg/dL (ref 0.50–1.10)
GFR calc Af Amer: >90 mL/min (ref >90)
GFR calc non Af Amer: >90 mL/min (ref >90)
Glucose, Bld: 94 mg/dL (ref 70–99)
Potassium: 3.4 mEq/L — ABNORMAL LOW (ref 3.5–5.1)
Sodium: 139 mEq/L (ref 135–145)
Total Bilirubin: 0.3 mg/dL (ref 0.3–1.2)
Total Protein: 8.4 g/dL — ABNORMAL HIGH (ref 6.0–8.3)

## 2012-10-11 LAB — CBC
HCT: 38 % (ref 36.0–46.0)
Hemoglobin: 13.2 g/dL (ref 12.0–15.0)
MCH: 29.4 pg (ref 26.0–34.0)
MCHC: 34.7 g/dL (ref 30.0–36.0)
MCV: 84.6 fL (ref 78.0–100.0)
Platelets: 255 10*3/uL (ref 150–400)
RBC: 4.49 MIL/uL (ref 3.87–5.11)
RDW: 14.2 % (ref 11.5–15.5)
WBC: 9.9 10*3/uL (ref 4.0–10.5)

## 2012-10-11 MED ORDER — ALBUTEROL SULFATE (5 MG/ML) 0.5% IN NEBU
2.5000 mg | INHALATION_SOLUTION | Freq: Once | RESPIRATORY_TRACT | Status: AC
  Administered 2012-10-11: 2.5 mg via RESPIRATORY_TRACT

## 2012-10-11 MED ORDER — ALBUTEROL SULFATE (5 MG/ML) 0.5% IN NEBU
INHALATION_SOLUTION | RESPIRATORY_TRACT | Status: AC
  Filled 2012-10-11: qty 0.5

## 2012-10-11 MED ORDER — METOPROLOL SUCCINATE ER 25 MG PO TB24: 25.0000 mg | ORAL_TABLET | Freq: Every day | ORAL | Status: DC

## 2012-10-11 MED ORDER — ALBUTEROL SULFATE HFA 108 (90 BASE) MCG/ACT IN AERS: 2.0000 | INHALATION_SPRAY | Freq: Four times a day (QID) | RESPIRATORY_TRACT | Status: DC | PRN

## 2012-10-11 MED ORDER — CETIRIZINE HCL 10 MG PO TABS: 10.0000 mg | ORAL_TABLET | Freq: Every day | ORAL | Status: DC

## 2012-10-11 MED ORDER — PREDNISONE 20 MG PO TABS: ORAL_TABLET | ORAL | Status: DC

## 2012-10-11 MED ORDER — IPRATROPIUM BROMIDE 0.02 % IN SOLN
0.5000 mg | Freq: Once | RESPIRATORY_TRACT | Status: AC
  Administered 2012-10-11: 0.5 mg via RESPIRATORY_TRACT

## 2012-10-11 MED ORDER — HYDROCHLOROTHIAZIDE 12.5 MG PO TABS: 12.5000 mg | ORAL_TABLET | Freq: Every day | ORAL | Status: DC

## 2012-10-11 MED ORDER — SULFAMETHOXAZOLE-TMP DS 800-160 MG PO TABS: 1.0000 | ORAL_TABLET | Freq: Two times a day (BID) | ORAL | Status: DC

## 2012-10-11 NOTE — ED Notes (Signed)
Patient complains of having a cough for almost 6  Months On and off back pain

## 2012-10-11 NOTE — ED Provider Notes (Signed)
History     CSN: [id number]  Arrival date & time 10/11/12  1711   First MD Initiated Contact with Patient 10/11/12 1723     Chief Complaint  Patient presents with  . Cough   HPI Pt reports that she is having cough for over 6 months.  Pt reports that she is coughing up beige thick mucus.  Pt says that she has no known exposures to anyone testing positve for TB. Pt has not traveled out of the country.  Pt says that she has not lost weight.  Pt says that she has a remote history of asthma but not taking any medication.  Pt says that she has not heard herself wheezing.  Pt says that she gets short of breath with exercise.  Pt has tenderness behind right scapula with deep breaths and coughing.  Pt stopped taking her beta blocker 1 month ago because she could not pay the cost to get the prescription filled.  Pt denies coughing up blood.  Pt denies chest pain.    Past Medical History  Diagnosis Date  . Tachycardia     Past Surgical History  Procedure Laterality Date  . Abdominal hysterectomy    . Cholecystectomy      No family history on file.  History  Substance Use Topics  . Smoking status: Never Smoker   . Smokeless tobacco: Not on file  . Alcohol Use: No    OB History   Grav Para Term Preterm Abortions TAB SAB Ect Mult Living                 Review of Systems Constitutional: Negative.  HENT: Negative.  Respiratory: cough, wheezing, SOB  Cardiovascular: Negative.  Gastrointestinal: Negative.  Endocrine: Negative.  Genitourinary: Negative.  Musculoskeletal: Negative.  Skin: Negative.  Allergic/Immunologic: Negative.  Neurological: Negative.  Hematological: Negative.  Psychiatric/Behavioral: Negative.  All other systems reviewed and are negative   Allergies  Review of patient's allergies indicates no known allergies.  Home Medications     BP 128/88  Pulse 100  Temp(Src) 98.4 F (36.9 C) (Oral)  Resp 18  Wt 155 lb (70.308 kg)  SpO2 96%  Physical  Exam Nursing note and vitals reviewed.  Constitutional: She is oriented to person, place, and time. She appears well-developed and well-nourished. No distress.  HENT:  Head: Normocephalic and atraumatic.  Eyes: Conjunctivae and EOM are normal. Pupils are equal, round, and reactive to light.  Neck: Normal range of motion. Neck supple. No JVD present. No tracheal deviation present. No thyromegaly present.  Cardiovascular: Normal rate, regular rhythm and normal heart sounds.  Pulmonary/Chest: Effort normal and breath sounds normal. No respiratory distress. She has no wheezes.  Abdominal: Soft. Bowel sounds are normal.  Musculoskeletal: Normal range of motion. She exhibits no edema and no tenderness. Left 1st toe ingrown nail  Lymphadenopathy:  She has no cervical adenopathy.  Neurological: She is alert and oriented to person, place, and time. She has normal reflexes.  Skin: Skin is warm and dry.  Psychiatric: She has a normal mood and affect. Her behavior is normal. Judgment and thought content normal.    ED Course  Procedures (including critical care time)  Labs Reviewed - No data to display No results found.  No diagnosis found.  MDM  IMPRESSION  Cough,chronic   Reactive airway disease  History of asthma  Hypertension  Ingrown toenail left 1st toe   RECOMMENDATIONS / PLAN Check cbc, cmp and other labs Check CXR 2 view  today  Albuterol HFA Nebulizer treatment given in office (duoneb)  Bactrim DS 1 po bid  Prednisone 40 mg po daily x 7 days   FOLLOW UP 2 weeks   The patient was given clear instructions to go to ER or return to medical center if symptoms don't improve, worsen or new problems develop.  The patient verbalized understanding.  The patient was told to call to get lab results if they haven't heard anything in the next week.           Cleora Fleet, MD 10/11/12 Rickey Primus

## 2012-10-12 LAB — VITAMIN D 25 HYDROXY (VIT D DEFICIENCY, FRACTURES): Vit D, 25-Hydroxy: 34 ng/mL (ref 30–89)

## 2012-10-12 LAB — HEMOGLOBIN A1C
Hgb A1c MFr Bld: 6.1 % — ABNORMAL HIGH (ref <5.7)
Mean Plasma Glucose: 128 mg/dL — ABNORMAL HIGH (ref <117)

## 2012-10-13 ENCOUNTER — Encounter: Payer: Self-pay | Admitting: Family Medicine

## 2012-10-13 DIAGNOSIS — E786 Lipoprotein deficiency: Secondary | ICD-10-CM | POA: Insufficient documentation

## 2012-10-13 DIAGNOSIS — R7303 Prediabetes: Secondary | ICD-10-CM | POA: Insufficient documentation

## 2012-10-13 NOTE — Progress Notes (Signed)
Quick Note:  Please inform patient that her A1c was elevated suggesting that she has prediabetes. Please recommend exercise 5x per week and reduce sugar intake. Her HDL (good) cholesterol came back low. Exercise and low fat low cholesterol diet will help. Recheck labs in 3 or 4 months. Also, her potassium was a little low. Recommend that she eat some potassium rich foods for the next 2 weeks.    Rodney Langton, MD, CDE, FAAFP [facility] [place], [place]   ______

## 2012-10-14 ENCOUNTER — Telehealth (HOSPITAL_COMMUNITY): Payer: Self-pay

## 2012-10-23 ENCOUNTER — Telehealth (HOSPITAL_COMMUNITY): Payer: Self-pay | Admitting: *Deleted

## 2012-10-23 NOTE — ED Notes (Signed)
Pt. called on VM yesterday requesting her lab results. I accessed pt.'s chart today and saw that she was [facility] at [facility]. I called pt. back and referred her to the [facility] [phone] and told her that Arvin Collard was the nurse that handles the labs for that clinic.

## 2012-11-06 ENCOUNTER — Emergency Department (HOSPITAL_COMMUNITY)
Admission: EM | Admit: 2012-11-06 | Discharge: 2012-11-06 | Disposition: A | Discharge status: Home Health | Payer: Self-pay | Source: Home / Self Care

## 2012-11-06 ENCOUNTER — Encounter (HOSPITAL_COMMUNITY): Payer: Self-pay

## 2012-11-06 DIAGNOSIS — I10 Essential (primary) hypertension: Secondary | ICD-10-CM

## 2012-11-06 DIAGNOSIS — R7303 Prediabetes: Secondary | ICD-10-CM

## 2012-11-06 DIAGNOSIS — E785 Hyperlipidemia, unspecified: Secondary | ICD-10-CM

## 2012-11-06 MED ORDER — ASPIRIN 81 MG PO TBEC: 81.0000 mg | DELAYED_RELEASE_TABLET | Freq: Every day | ORAL | Status: DC

## 2012-11-06 MED ORDER — PREDNISONE 20 MG PO TABS: 20.0000 mg | ORAL_TABLET | Freq: Every day | ORAL | Status: AC

## 2012-11-06 NOTE — ED Provider Notes (Deleted)
History     CSN: [id number]  Arrival date & time 11/06/12  8172  62 year old female presents to the ER with multiple complaints. The patient states that she has been suffering from a skin rash described as plaque-like lesions on her scalp and her back for the last several years. She states that Dr. Mayford Knife  dermatologist in [place] had diagnosed it, and treated it with prednisone. 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 The patient is also requesting a gynecologic referral    Chief Complaint  Patient presents with  . Follow-up    (Consider location/radiation/quality/duration/timing/severity/associated sxs/prior treatment) HPI  Past Medical History  Diagnosis Date  . Tachycardia     Past Surgical History  Procedure Laterality Date  . Abdominal hysterectomy    . Cholecystectomy      No family history on file.  History  Substance Use Topics  . Smoking status: Never Smoker   . Smokeless tobacco: Not on file  . Alcohol Use: No    OB History   Grav Para Term Preterm Abortions TAB SAB Ect Mult Living                  Review of Systems Review of Systems  Constitutional: Negative.  HENT: Negative.  Respiratory: cough, wheezing, SOB  Cardiovascular: Negative.  Gastrointestinal: Negative.  Endocrine: Negative.  Genitourinary: Negative.  Musculoskeletal: Negative.  Skin: Negative.  Allergic/Immunologic: Negative.  Neurological: Negative.  Hematological: Negative.  Psychiatric/Behavioral: Negative.  All other systems reviewed and are negative  Allergies  Review of patient's allergies indicates no known allergies.  Home Medications   Current Outpatient Rx  Name  Route  Sig  Dispense  Refill  . albuterol (PROVENTIL HFA;VENTOLIN HFA) 108 (90 BASE) MCG/ACT inhaler   Inhalation   Inhale 2 puffs into the lungs every 6 (six) hours as needed for wheezing.   1 Inhaler   2   . aspirin EC 81 MG  tablet   Oral   Take 81 mg by mouth daily.         . cetirizine (ZYRTEC) 10 MG tablet   Oral   Take 1 tablet (10 mg total) by mouth daily.   30 tablet   1   . hydrochlorothiazide (HYDRODIURIL) 12.5 MG tablet   Oral   Take 1 tablet (12.5 mg total) by mouth daily.   30 tablet   3   . metoprolol succinate (TOPROL-XL) 25 MG 24 hr tablet   Oral   Take 1 tablet (25 mg total) by mouth daily.   30 tablet   3   . predniSONE (DELTASONE) 20 MG tablet   Oral   Take 1 tablet (20 mg total) by mouth daily. Take 2 tabs po daily   30 tablet   2   . sulfamethoxazole-trimethoprim (BACTRIM DS) 800-160 MG per tablet   Oral   Take 1 tablet by mouth 2 (two) times daily.   14 tablet   0     BP 142/87  Pulse 70  Temp(Src) 97.7 F (36.5 C) (Oral)  SpO2 100%  Physical Exam Constitutional: She is oriented to person, place, and time. She appears well-developed and well-nourished. No distress.  HENT:  Head: Normocephalic and atraumatic.  Eyes: Conjunctivae and EOM are normal. Pupils are equal, round, and reactive to light.  Neck: Normal range of motion. Neck supple. No JVD present. No tracheal deviation present. No thyromegaly present.  Cardiovascular: Normal rate,  regular rhythm and normal heart sounds.  Pulmonary/Chest: Effort normal and breath sounds normal. No respiratory distress. She has no wheezes.  Abdominal: Soft. Bowel sounds are normal.  Musculoskeletal: Normal range of motion. She exhibits no edema and no tenderness. Left 1st toe ingrown nail  Lymphadenopathy:  She has no cervical adenopathy.  Neurological: She is alert and oriented to person, place, and time. She has normal reflexes.  Skin: Skin is warm and dry.  Psychiatric: She has a normal mood and affect. Her behavior is normal. Judgment and thought content normal.     ED Course  Procedures (including critical care time)  Labs Reviewed - No data to display No results found.   1. Alkaline phosphatase elevation  we'll obtain abdominal right upper quadrant ultrasound today, CMP in 1 week   2. Prediabetes continue diet and exercise and advised that Dr. Laural Benes   3. Tachycardia stable   4. Skin rash multiple plaque-like skin lesions on the scalp., Prednisone 20 mg a day prescribed for about 10 days. Dermatology referral provided, patient will need a repeat biopsy    MDM           Richarda Overlie, MD 11/06/12 1245

## 2012-11-06 NOTE — ED Provider Notes (Signed)
History     CSN: [id number]  Arrival date & time 11/06/12  3750  62 year old female who presents to adult care clinic for a followup of her labs. Her hemoglobin A1c is 6.1. LDL is 123 HDL of 38. The patient was advised to exercise and try and do some weight. Her alkaline phosphatase was also mildly elevated probably secondary to fatty liver. The patient does not want to start a prescription medication for prediabetes.  Her blood pressure is mildly elevated at 142 systolic. The patient has been compliant with her medications. She is motivated to lose weight.  Chief Complaint  Patient presents with  . Follow-up    (Consider location/radiation/quality/duration/timing/severity/associated sxs/prior treatment) HPI  Past Medical History  Diagnosis Date  . Tachycardia     Past Surgical History  Procedure Laterality Date  . Abdominal hysterectomy    . Cholecystectomy      No family history on file.  History  Substance Use Topics  . Smoking status: Never Smoker   . Smokeless tobacco: Not on file  . Alcohol Use: No    OB History   Grav Para Term Preterm Abortions TAB SAB Ect Mult Living                  Review of Systems Review of Systems  Constitutional: Negative.  HENT: Negative.  Respiratory: cough, wheezing, SOB  Cardiovascular: Negative.  Gastrointestinal: Negative.  Endocrine: Negative.  Genitourinary: Negative.  Musculoskeletal: Negative.  Skin: Negative.  Allergic/Immunologic: Negative.  Neurological: Negative.  Hematological: Negative.  Psychiatric/Behavioral: Negative.  All other systems reviewed and are negative  Allergies  Review of patient's allergies indicates no known allergies.  Home Medications   Current Outpatient Rx  Name  Route  Sig  Dispense  Refill  . albuterol (PROVENTIL HFA;VENTOLIN HFA) 108 (90 BASE) MCG/ACT inhaler   Inhalation   Inhale 2 puffs into the lungs every 6 (six) hours as needed for wheezing.   1 Inhaler   2   . aspirin  EC 81 MG tablet   Oral   Take 81 mg by mouth daily.         . cetirizine (ZYRTEC) 10 MG tablet   Oral   Take 1 tablet (10 mg total) by mouth daily.   30 tablet   1   . hydrochlorothiazide (HYDRODIURIL) 12.5 MG tablet   Oral   Take 1 tablet (12.5 mg total) by mouth daily.   30 tablet   3   . metoprolol succinate (TOPROL-XL) 25 MG 24 hr tablet   Oral   Take 1 tablet (25 mg total) by mouth daily.   30 tablet   3   . predniSONE (DELTASONE) 20 MG tablet   Oral   Take 1 tablet (20 mg total) by mouth daily. Take 2 tabs po daily   30 tablet   2   . sulfamethoxazole-trimethoprim (BACTRIM DS) 800-160 MG per tablet   Oral   Take 1 tablet by mouth 2 (two) times daily.   14 tablet   0     BP 142/87  Pulse 70  Temp(Src) 97.7 F (36.5 C) (Oral)  SpO2 100%  Physical Exam HENT:  Head: Normocephalic and atraumatic.  Eyes: Conjunctivae and EOM are normal. Pupils are equal, round, and reactive to light.  Neck: Normal range of motion. Neck supple. No JVD present. No tracheal deviation present. No thyromegaly present.  Cardiovascular: Normal rate, regular rhythm and normal heart sounds.  Pulmonary/Chest: Effort normal and breath sounds normal. No  respiratory distress. She has no wheezes.  Abdominal: Soft. Bowel sounds are normal.  Musculoskeletal: Normal range of motion. She exhibits no edema and no tenderness. Left 1st toe ingrown nail  Lymphadenopathy:  She has no cervical adenopathy.  Neurological: She is alert and oriented to person, place, and time. She has normal reflexes.  Skin: Skin is warm and dry.  Psychiatric: She has a normal mood and affect. Her behavior is normal. Judgment and thought content normal.     ED Course  Procedures (including critical care time)  Labs Reviewed - No data to display No results found.   1. Prediabetes -recheck hemoglobin A1c in 3 months, she is motivated to exercise and lose weight   2. Dyslipidemia again does not want to be  started on any medication at this time Advised to start a baby aspirin   3. Hypertension continue current regimen, start baby aspirin    Followup in 45 days for blood pressure check Follow up labs in 3 months   MDM           Richarda Overlie, MD 11/06/12 1305

## 2012-11-06 NOTE — ED Notes (Signed)
Follow up- results of lab work

## 2012-12-23 ENCOUNTER — Ambulatory Visit: Payer: Self-pay

## 2013-01-08 ENCOUNTER — Ambulatory Visit: Payer: Self-pay

## 2013-01-09 ENCOUNTER — Ambulatory Visit: Payer: No Typology Code available for payment source | Attending: Family Medicine | Admitting: Internal Medicine

## 2013-01-09 VITALS — BP 106/72 | HR 76 | Temp 97.7°F | Resp 15 | Ht <= 58 in | Wt 152.4 lb

## 2013-01-09 DIAGNOSIS — I10 Essential (primary) hypertension: Secondary | ICD-10-CM | POA: Insufficient documentation

## 2013-01-09 DIAGNOSIS — J4520 Mild intermittent asthma, uncomplicated: Secondary | ICD-10-CM

## 2013-01-09 DIAGNOSIS — J45909 Unspecified asthma, uncomplicated: Secondary | ICD-10-CM

## 2013-01-09 DIAGNOSIS — R7309 Other abnormal glucose: Secondary | ICD-10-CM | POA: Insufficient documentation

## 2013-01-09 LAB — BASIC METABOLIC PANEL
BUN: 9 mg/dL (ref 6–23)
CO2: 28 mEq/L (ref 19–32)
Calcium: 9.3 mg/dL (ref 8.4–10.5)
Chloride: 104 mEq/L (ref 96–112)
Creat: 0.63 mg/dL (ref 0.50–1.10)
Glucose, Bld: 83 mg/dL (ref 70–99)
Potassium: 3.7 mEq/L (ref 3.5–5.3)
Sodium: 139 mEq/L (ref 135–145)

## 2013-01-09 MED ORDER — ALBUTEROL SULFATE HFA 108 (90 BASE) MCG/ACT IN AERS: 2.0000 | INHALATION_SPRAY | Freq: Four times a day (QID) | RESPIRATORY_TRACT | Status: DC | PRN

## 2013-01-09 NOTE — Addendum Note (Signed)
Addended by: Leroy Sea on: 01/09/2013 03:44 PM   Modules accepted: Orders

## 2013-01-09 NOTE — Patient Instructions (Signed)
Heart healthy low carbohydrate diet, low impact aerobic exercise 30 minutes a week for 5 times a week

## 2013-01-09 NOTE — Progress Notes (Signed)
Patient here for follow up Borderline DM

## 2013-01-09 NOTE — Progress Notes (Addendum)
Patient ID: PERL FOLMAR, female   DOB: [birth year], 62 y.o.   MRN: [id number]  Patient Demographics  Emily Clarke, is a 62 y.o. female  JYN:[id number]  [id number]  DOB - 25-Sep-1950  Chief Complaint  Patient presents with  . Follow-up        Subjective:   Emily Clarke with History of prediabetes with A1c of 6.1, chronic asthma in good control, right-sided flank pain for the last 2 years which is not getting better, patient at one point was told she has cyst on her right kidney. She is here for following up on her A1c result.  Denies any subjective complaints except as above, no active headache, no chest abdominal pain at this time, not short of breath. No focal weakness which is new.   Objective:    Patient Active Problem List   Diagnosis Date Noted  . Low HDL (under 40) 10/13/2012  . Prediabetes 10/13/2012  . Chronic cough 10/11/2012  . Reactive airway disease 10/11/2012  . History of asthma 10/11/2012  . Ingrown toenail 10/11/2012  . Allergic rhinitis 10/11/2012  . Pleuritic chest pain 10/11/2012  . Tachycardia      Filed Vitals:   01/09/13 1515  BP: 106/72  Pulse: 76  Temp: 97.7 F (36.5 C)  Resp: 15  Weight: 152 lb 6.4 oz (69.128 kg)  SpO2: 100%     Exam  Awake Alert, Oriented X 3, No new F.N deficits, Normal affect [place].AT,PERRAL Supple Neck,No JVD, No cervical lymphadenopathy appriciated.  Symmetrical Chest wall movement, Good air movement bilaterally, CTAB RRR,No Gallops,Rubs or new Murmurs, No Parasternal Heave +ve B.Sounds, Abd Soft, Non tender, No organomegaly appriciated, No rebound - guarding or rigidity. No Cyanosis, Clubbing or edema, No new Rash or bruise      Data Review   CBC No results found for this basename: WBC, HGB, HCT, PLT, MCV, MCH, MCHC, RDW, NEUTRABS, LYMPHSABS, MONOABS, EOSABS, BASOSABS, BANDABS, BANDSABD,  in the last 168 hours  Chemistries   No results found for this basename: NA, K, CL, CO2, GLUCOSE, BUN,  CREATININE, GFRCGP, CALCIUM, MG, AST, ALT, ALKPHOS, BILITOT,  in the last 168 hours ------------------------------------------------------------------------------------------------------------------ No results found for this basename: HGBA1C,  in the last 72 hours ------------------------------------------------------------------------------------------------------------------ No results found for this basename: CHOL, HDL, LDLCALC, TRIG, CHOLHDL, LDLDIRECT,  in the last 72 hours ------------------------------------------------------------------------------------------------------------------ No results found for this basename: TSH, T4TOTAL, FREET3, T3FREE, THYROIDAB,  in the last 72 hours ------------------------------------------------------------------------------------------------------------------ No results found for this basename: VITAMINB12, FOLATE, FERRITIN, TIBC, IRON, RETICCTPCT,  in the last 72 hours  Coagulation profile  No results found for this basename: INR, PROTIME,  in the last 168 hours     Prior to Admission medications   Medication Sig Start Date End Date Taking? Authorizing Provider  albuterol (PROVENTIL HFA;VENTOLIN HFA) 108 (90 BASE) MCG/ACT inhaler Inhale 2 puffs into the lungs every 6 (six) hours as needed for wheezing. 01/09/13   Leroy Sea, MD  aspirin EC 81 MG tablet Take 81 mg by mouth daily.    Historical Provider, MD  cetirizine (ZYRTEC) 10 MG tablet Take 1 tablet (10 mg total) by mouth daily. 10/11/12   Clanford Cyndie Mull, MD  hydrochlorothiazide (HYDRODIURIL) 12.5 MG tablet Take 1 tablet (12.5 mg total) by mouth daily. 10/11/12   Clanford Cyndie Mull, MD  metoprolol succinate (TOPROL-XL) 25 MG 24 hr tablet Take 1 tablet (25 mg total) by mouth daily. 10/11/12   Clanford Cyndie Mull, MD  sulfamethoxazole-trimethoprim (BACTRIM DS) 800-160 MG  per tablet Take 1 tablet by mouth 2 (two) times daily. 10/11/12   Clanford Cyndie Mull, MD     Assessment & Plan     History of chronic right-sided flank pain which has been there for 2 years- she was told in the past that she had a cyst on her right kidney, at this point since her pain persists chronic basis I will obtain CT abdomen and pelvis to ensure any life-threatening pathology. We'll repeat a BMP today.   Prediabetes. Advised on diet and exercise, will check A1c again in 6 months.   Chronic asthma stable inhaler refill given.  Hypertension continue present regimen.    Routine health maintenance.  Screening labs. CBC, BMP, A1c, lipid panel noted all unremarkable except A1c 6.1.  Colonoscopy per patient was done last year and was stable  Mammogram has been stable she gets annually  Pap smear referred  Immunizations we are out of tetanus shot she will get one next visit     Leroy Sea M.D on 01/09/2013 at 3:32 PM

## 2013-01-13 ENCOUNTER — Ambulatory Visit (HOSPITAL_COMMUNITY)
Admission: RE | Admit: 2013-01-13 | Discharge: 2013-01-13 | Disposition: A | Discharge status: Home / Self Care | Payer: No Typology Code available for payment source | Source: Ambulatory Visit | Attending: Internal Medicine | Admitting: Internal Medicine

## 2013-01-13 ENCOUNTER — Encounter (HOSPITAL_COMMUNITY): Payer: Self-pay

## 2013-01-13 DIAGNOSIS — J4520 Mild intermittent asthma, uncomplicated: Secondary | ICD-10-CM

## 2013-01-13 DIAGNOSIS — K5710 Diverticulosis of small intestine without perforation or abscess without bleeding: Secondary | ICD-10-CM | POA: Insufficient documentation

## 2013-01-13 DIAGNOSIS — R109 Unspecified abdominal pain: Secondary | ICD-10-CM | POA: Insufficient documentation

## 2013-01-13 HISTORY — DX: Essential (primary) hypertension: I10

## 2013-01-13 MED ORDER — IOHEXOL 300 MG/ML  SOLN
100.0000 mL | Freq: Once | INTRAMUSCULAR | Status: AC | PRN
  Administered 2013-01-13: 100 mL via INTRAVENOUS

## 2013-01-15 ENCOUNTER — Telehealth: Payer: Self-pay

## 2013-01-15 NOTE — Telephone Encounter (Signed)
Patient has a scheduled appointment for tomorrow 01/16/13 To discuss results

## 2013-01-15 NOTE — Progress Notes (Signed)
Quick Note:  Please have patient comeback for GI referral and to discuss CT results ______

## 2013-01-15 NOTE — Telephone Encounter (Signed)
Message copied by Lestine Mount on Wed Jan 15, 2013  4:02 PM ------      Message from: [facility]      Created: Wed Jan 15, 2013  1:18 PM       Please have patient comeback for GI referral and to discuss CT results ------

## 2013-01-16 ENCOUNTER — Encounter: Payer: Self-pay | Admitting: Family Medicine

## 2013-01-16 ENCOUNTER — Ambulatory Visit: Payer: No Typology Code available for payment source | Attending: Family Medicine | Admitting: Family Medicine

## 2013-01-16 VITALS — BP 129/87 | HR 68 | Temp 98.7°F | Resp 16 | Ht 64.0 in | Wt 152.4 lb

## 2013-01-16 DIAGNOSIS — K760 Fatty (change of) liver, not elsewhere classified: Secondary | ICD-10-CM

## 2013-01-16 DIAGNOSIS — R109 Unspecified abdominal pain: Secondary | ICD-10-CM | POA: Insufficient documentation

## 2013-01-16 DIAGNOSIS — K5710 Diverticulosis of small intestine without perforation or abscess without bleeding: Secondary | ICD-10-CM

## 2013-01-16 DIAGNOSIS — R935 Abnormal findings on diagnostic imaging of other abdominal regions, including retroperitoneum: Secondary | ICD-10-CM

## 2013-01-16 DIAGNOSIS — K7689 Other specified diseases of liver: Secondary | ICD-10-CM | POA: Insufficient documentation

## 2013-01-16 NOTE — Progress Notes (Signed)
Patient ID: Emily Clarke, female   DOB: 08-11-1950, 62 y.o.   MRN: [id number]  CC: follow up   HPI: The patient is presenting today to followup for the CT scan that she had done recently.  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  There was mention of hepatic steatosis.  The patient reports she's not having any abdominal pain.  She's not having any diarrhea or constipation.  She reports that she continues to have intermittent episodes of right side and flank pain.  She is having no nausea.  The patient says that she otherwise has been doing well.  No Known Allergies Past Medical History  Diagnosis Date  . Tachycardia   . Hypertension    Current Outpatient Prescriptions on File Prior to Visit  Medication Sig Dispense Refill  . albuterol (PROVENTIL HFA;VENTOLIN HFA) 108 (90 BASE) MCG/ACT inhaler Inhale 2 puffs into the lungs every 6 (six) hours as needed for wheezing.  1 Inhaler  2  . aspirin EC 81 MG tablet Take 81 mg by mouth daily.      . cetirizine (ZYRTEC) 10 MG tablet Take 1 tablet (10 mg total) by mouth daily.  30 tablet  1  . hydrochlorothiazide (HYDRODIURIL) 12.5 MG tablet Take 1 tablet (12.5 mg total) by mouth daily.  30 tablet  3  . metoprolol succinate (TOPROL-XL) 25 MG 24 hr tablet Take 1 tablet (25 mg total) by mouth daily.  30 tablet  3  . sulfamethoxazole-trimethoprim (BACTRIM DS) 800-160 MG per tablet Take 1 tablet by mouth 2 (two) times daily.  14 tablet  0   No current facility-administered medications on file prior to visit.   No family history on file. History   Social History  . Marital Status: Divorced    Spouse Name: N/A    Number of Children: N/A  . Years of Education: N/A   Occupational History  . Not on file.   Social History Main Topics  . Smoking status: Never Smoker   . Smokeless tobacco: Not on file  . Alcohol Use: No  . Drug Use: No  . Sexually Active: Not on file   Other Topics Concern  . Not on file   Social History  Narrative  . No narrative on file    Review of Systems  Constitutional: Negative for fever, chills, diaphoresis, activity change, appetite change and fatigue.  HENT: Negative for ear pain, nosebleeds, congestion, facial swelling, rhinorrhea, neck pain, neck stiffness and ear discharge.   Eyes: Negative for pain, discharge, redness, itching and visual disturbance.  Respiratory: Negative for cough, choking, chest tightness, shortness of breath, wheezing and stridor.   Cardiovascular: Negative for chest pain, palpitations and leg swelling.  Gastrointestinal: Negative for abdominal distention.  Genitourinary: Negative for dysuria, urgency, frequency, hematuria, flank pain, decreased urine volume, difficulty urinating and dyspareunia.  Musculoskeletal: Negative for back pain, joint swelling, arthralgias and gait problem.  Neurological: Negative for dizziness, tremors, seizures, syncope, facial asymmetry, speech difficulty, weakness, light-headedness, numbness and headaches.  Hematological: Negative for adenopathy. Does not bruise/bleed easily.  Psychiatric/Behavioral: Negative for hallucinations, behavioral problems, confusion, dysphoric mood, decreased concentration and agitation.    Objective:   Filed Vitals:   01/16/13 1347  BP: 129/87  Pulse: 68  Temp: 98.7 F (37.1 C)  Resp: 16    Physical Exam  Constitutional: Appears well-developed and well-nourished. No distress.  HENT: Normocephalic. External right and left ear normal. Oropharynx is clear and moist.  Eyes: Conjunctivae and EOM are normal. PERRLA,  no scleral icterus.  Neck: Normal ROM. Neck supple. No JVD. No tracheal deviation. No thyromegaly.  CVS: RRR, S1/S2 +, no murmurs, no gallops, no carotid bruit.  Pulmonary: Effort and breath sounds normal, no stridor, rhonchi, wheezes, rales.  Abdominal: Soft. BS +,  no distension, tenderness, rebound or guarding.  Musculoskeletal: Normal range of motion. No edema and no tenderness.   Lymphadenopathy: No lymphadenopathy noted, cervical, inguinal. Neuro: Alert. Normal reflexes, muscle tone coordination. No cranial nerve deficit. Skin: Skin is warm and dry. No rash noted. Not diaphoretic. No erythema. No pallor.  Psychiatric: Normal mood and affect. Behavior, judgment, thought content normal.   Lab Results  Component Value Date   WBC 9.9 10/11/2012   HGB 13.2 10/11/2012   HCT 38.0 10/11/2012   MCV 84.6 10/11/2012   PLT 255 10/11/2012   Lab Results  Component Value Date   CREATININE 0.63 01/09/2013   BUN 9 01/09/2013   NA 139 01/09/2013   K 3.7 01/09/2013   CL 104 01/09/2013   CO2 28 01/09/2013    Lab Results  Component Value Date   HGBA1C 6.1* 10/11/2012   Lipid Panel     Component Value Date/Time   CHOL 195 10/11/2012 1737   TRIG 168* 10/11/2012 1737   HDL 38* 10/11/2012 1737   CHOLHDL 5.1 10/11/2012 1737   VLDL 34 10/11/2012 1737   LDLCALC 123* 10/11/2012 1737      Assessment and plan:   Patient Active Problem List   Diagnosis Date Noted  . Low HDL (under 40) 10/13/2012  . Prediabetes 10/13/2012  . Chronic cough 10/11/2012  . Reactive airway disease 10/11/2012  . History of asthma 10/11/2012  . Ingrown toenail 10/11/2012  . Allergic rhinitis 10/11/2012  . Pleuritic chest pain 10/11/2012  . Tachycardia     Referral to GI for further evaluation of The abnormal CT scan as recommended by Dr. Thedore Mins  The patient written information on hepatic steatosis so she could read about that and get more information about that.  The patient was given clear instructions to go to ER or return to medical center if symptoms don't improve, worsen or new problems develop.  The patient verbalized understanding.  The patient was told to call to get any lab results if not heard anything in the next week.    RTC in 3 months  Rodney Langton, MD, CDE, FAAFP [facility] [place], [place]

## 2013-01-16 NOTE — Patient Instructions (Addendum)
Diverticulosis Diverticulosis is a common condition that develops when small pouches (diverticula) form in the wall of the colon. The risk of diverticulosis increases with age. It happens more often in people who eat a low-fiber diet. Most individuals with diverticulosis have no symptoms. Those individuals with symptoms usually experience abdominal pain, constipation, or loose stools (diarrhea). HOME CARE INSTRUCTIONS   Increase the amount of fiber in your diet as directed by your caregiver or dietician. This may reduce symptoms of diverticulosis.  Your caregiver may recommend taking a dietary fiber supplement.  Drink at least 6 to 8 glasses of water each day to prevent constipation.  Try not to strain when you have a bowel movement.  Your caregiver may recommend avoiding nuts and seeds to prevent complications, although this is still an uncertain benefit.  Only take over-the-counter or prescription medicines for pain, discomfort, or fever as directed by your caregiver. FOODS WITH HIGH FIBER CONTENT INCLUDE:  Fruits. Apple, peach, pear, tangerine, raisins, prunes.  Vegetables. Brussels sprouts, asparagus, broccoli, cabbage, carrot, cauliflower, romaine lettuce, spinach, summer squash, tomato, winter squash, zucchini.  Starchy Vegetables. Baked beans, kidney beans, lima beans, split peas, lentils, potatoes (with skin).  Grains. Whole wheat bread, brown rice, bran flake cereal, plain oatmeal, white rice, shredded wheat, bran muffins. SEEK IMMEDIATE MEDICAL CARE IF:   You develop increasing pain or severe bloating.  You have an oral temperature above 102 F (38.9 C), not controlled by medicine.  You develop vomiting or bowel movements that are bloody or black. Document Released: 03/16/2004 Document Revised: 09/11/2011 Document Reviewed: 11/17/2009 [facility] Patient Information 2014 [place], [place].  Fatty Liver Fatty liver is the accumulation of fat in liver cells. It is also called  hepatosteatosis or steatohepatitis. It is normal for your liver to contain some fat. If fat is more than 5 to 10% of your liver's weight, you have fatty liver.  There are often no symptoms (problems) for years while damage is still occurring. People often learn about their fatty liver when they have medical tests for other reasons. Fat can damage your liver for years or even decades without causing problems. When it becomes severe, it can cause fatigue, weight loss, weakness, and confusion. This makes you more likely to develop more serious liver problems. The liver is the largest organ in the body. It does a lot of work and often gives no warning signs when it is sick until late in a disease. The liver has many important jobs including:  Breaking down foods.  Storing vitamins, iron, and other minerals.  Making proteins.  Making bile for food digestion.  Breaking down many products including medications, alcohol and some poisons. CAUSES  There are a number of different conditions, medications, and poisons that can cause a fatty liver. Eating too many calories causes fat to build up in the liver. Not processing and breaking fats down normally may also cause this. Certain conditions, such as obesity, diabetes, and high triglycerides also cause this. Most fatty liver patients tend to be middle-aged and over weight.  Some causes of fatty liver are:  Alcohol over consumption.  Malnutrition.  Steroid use.  Valproic acid toxicity.  Obesity.  Cushing's syndrome.  Poisons.  Tetracycline in high dosages.  Pregnancy.  Diabetes.  Hyperlipidemia.  Rapid weight loss. Some people develop fatty liver even having none of these conditions. SYMPTOMS  Fatty liver most often causes no problems. This is called asymptomatic.  It can be diagnosed with blood tests and also by a liver  biopsy.  It is one of the most common causes of minor elevations of liver enzymes on routine blood  tests.  Specialized Imaging of the liver using ultrasound, CT (computed tomography) scan, or MRI (magnetic resonance imaging) can suggest a fatty liver but a biopsy is needed to confirm it.  A biopsy involves taking a small sample of liver tissue. This is done by using a needle. It is then looked at under a microscope by a specialist. TREATMENT  It is important to treat the cause. Simple fatty liver without a medical reason may not need treatment.  Weight loss, fat restriction, and exercise in overweight patients produces inconsistent results but is worth trying.  Fatty liver due to alcohol toxicity may not improve even with stopping drinking.  Good control of diabetes may reduce fatty liver.  Lower your triglycerides through diet, medication or both.  Eat a balanced, healthy diet.  Increase your physical activity.  Get regular checkups from a liver specialist.  There are no medical or surgical treatments for a fatty liver or NASH, but improving your diet and increasing your exercise may help prevent or reverse some of the damage. PROGNOSIS  Fatty liver may cause no damage or it can lead to an inflammation of the liver. This is, called steatohepatitis. When it is linked to alcohol abuse, it is called alcoholic steatohepatitis. It often is not linked to alcohol. It is then called nonalcoholic steatohepatitis, or NASH. Over time the liver may become scarred and hardened. This condition is called cirrhosis. Cirrhosis is serious and may lead to liver failure or cancer. NASH is one of the leading causes of cirrhosis. About 10-20% of Americans have fatty liver and a smaller 2-5% has NASH. Document Released: 08/04/2005 Document Revised: 09/11/2011 Document Reviewed: 09/27/2005 [facility] Patient Information 2014 [place], [place].   Back Pain, Adult Back pain is very common. The pain often gets better over time. The cause of back pain is usually not dangerous. Most people can learn to manage their  back pain on their own.  HOME CARE   Stay active. Start with short walks on flat ground if you can. Try to walk farther each day.  Do not sit, drive, or stand in one place for more than 30 minutes. Do not stay in bed.  Do not avoid exercise or work. Activity can help your back heal faster.  Be careful when you bend or lift an object. Bend at your knees, keep the object close to you, and do not twist.  Sleep on a firm mattress. Lie on your side, and bend your knees. If you lie on your back, put a pillow under your knees.  Only take medicines as told by your doctor.  Put ice on the injured area.  Put ice in a plastic bag.  Place a towel between your skin and the bag.  Leave the ice on for 15-20 minutes, 3-4 times a day for the first 2 to 3 days. After that, you can switch between ice and heat packs.  Ask your doctor about back exercises or massage.  Avoid feeling anxious or stressed. Find good ways to deal with stress, such as exercise. GET HELP RIGHT AWAY IF:   Your pain does not go away with rest or medicine.  Your pain does not go away in 1 week.  You have new problems.  You do not feel well.  The pain spreads into your legs.  You cannot control when you poop (bowel movement) or pee (urinate).  Your arms or legs feel weak or lose feeling (numbness).  You feel sick to your stomach (nauseous) or throw up (vomit).  You have belly (abdominal) pain.  You feel like you may pass out (faint). MAKE SURE YOU:   Understand these instructions.  Will watch your condition.  Will get help right away if you are not doing well or get worse. Document Released: 12/06/2007 Document Revised: 09/11/2011 Document Reviewed: 11/07/2010 [facility] Patient Information 2014 [place], [place].

## 2013-01-17 ENCOUNTER — Encounter: Payer: Self-pay | Admitting: Internal Medicine

## 2013-01-24 ENCOUNTER — Ambulatory Visit (HOSPITAL_COMMUNITY)
Admission: RE | Admit: 2013-01-24 | Discharge: 2013-01-24 | Disposition: A | Discharge status: Home / Self Care | Payer: No Typology Code available for payment source | Source: Ambulatory Visit | Attending: Family Medicine | Admitting: Family Medicine

## 2013-01-24 DIAGNOSIS — Z1231 Encounter for screening mammogram for malignant neoplasm of breast: Secondary | ICD-10-CM | POA: Insufficient documentation

## 2013-01-27 ENCOUNTER — Other Ambulatory Visit: Payer: Self-pay | Admitting: Family Medicine

## 2013-01-27 DIAGNOSIS — R928 Other abnormal and inconclusive findings on diagnostic imaging of breast: Secondary | ICD-10-CM

## 2013-02-07 ENCOUNTER — Encounter: Payer: Self-pay | Admitting: Internal Medicine

## 2013-02-11 ENCOUNTER — Encounter: Payer: Self-pay | Admitting: Internal Medicine

## 2013-02-11 ENCOUNTER — Other Ambulatory Visit: Payer: Self-pay | Admitting: Internal Medicine

## 2013-02-11 ENCOUNTER — Ambulatory Visit (INDEPENDENT_AMBULATORY_CARE_PROVIDER_SITE_OTHER): Payer: No Typology Code available for payment source | Admitting: Internal Medicine

## 2013-02-11 ENCOUNTER — Ambulatory Visit (INDEPENDENT_AMBULATORY_CARE_PROVIDER_SITE_OTHER)
Admission: RE | Admit: 2013-02-11 | Discharge: 2013-02-11 | Disposition: A | Discharge status: Home / Self Care | Payer: No Typology Code available for payment source | Source: Ambulatory Visit | Attending: Internal Medicine | Admitting: Internal Medicine

## 2013-02-11 VITALS — BP 100/76 | HR 60 | Ht 64.0 in | Wt 151.4 lb

## 2013-02-11 DIAGNOSIS — M549 Dorsalgia, unspecified: Secondary | ICD-10-CM

## 2013-02-11 DIAGNOSIS — M62838 Other muscle spasm: Secondary | ICD-10-CM

## 2013-02-11 DIAGNOSIS — K760 Fatty (change of) liver, not elsewhere classified: Secondary | ICD-10-CM

## 2013-02-11 DIAGNOSIS — K5710 Diverticulosis of small intestine without perforation or abscess without bleeding: Secondary | ICD-10-CM

## 2013-02-11 DIAGNOSIS — K7689 Other specified diseases of liver: Secondary | ICD-10-CM

## 2013-02-11 MED ORDER — CYCLOBENZAPRINE HCL 10 MG PO TABS: 10.0000 mg | ORAL_TABLET | Freq: Three times a day (TID) | ORAL | Status: DC | PRN

## 2013-02-11 NOTE — Progress Notes (Signed)
Patient ID: Emily Clarke, female   DOB: [birth year], 62 y.o.   MRN: [id number] HPI: Norell is a 62 year old female with a past medical history of hypertension and adenomatous colon polyps who is seen in consultation at the request of Dr. Laural Benes to evaluate right mid back pain and abnormal GI imaging.  The patient is alone today. She reports right mid back and right flank pressure which has been a problem for her over the last 2 years. She reports this is a nearly daily pain, but tends to be worse with movement and with touch.  She also feels it is specifically worse if she is overly tired. She denies any association of this pain with eating or bowel movement. She reports normal bowel movements without diarrhea or constipation. She denies blood in her stool or melena. Good appetite without nausea or vomiting. No heartburn. No trouble swallowing. No fevers or chills. The pain described previously is better when she lies directly over the pain. She reports previously being told this might of been due to to "bruising from coughing" and previously she was treated with a muscle relaxer. She reports the muscle relaxer helped but the prescription expired and it was not refilled. She does recall being told she may have spinal arthritis. No weight loss. No bloating.  She recalls colonoscopy 2 years ago with 2 polyps removed  Past Medical History  Diagnosis Date  . Tachycardia   . Hypertension     Past Surgical History  Procedure Laterality Date  . Abdominal hysterectomy    . Cholecystectomy      Current Outpatient Prescriptions  Medication Sig Dispense Refill  . albuterol (PROVENTIL HFA;VENTOLIN HFA) 108 (90 BASE) MCG/ACT inhaler Inhale 2 puffs into the lungs every 6 (six) hours as needed for wheezing.  1 Inhaler  2  . aspirin EC 81 MG tablet Take 81 mg by mouth daily.      . cyclobenzaprine (FLEXERIL) 10 MG tablet Take 1 tablet (10 mg total) by mouth 3 (three) times daily as needed for muscle spasms.   30 tablet  0  . hydrochlorothiazide (HYDRODIURIL) 12.5 MG tablet Take 1 tablet (12.5 mg total) by mouth daily.  30 tablet  3  . metoprolol succinate (TOPROL-XL) 25 MG 24 hr tablet Take 1 tablet (25 mg total) by mouth daily.  30 tablet  3   No current facility-administered medications for this visit.    No Known Allergies  Family History  Problem Relation Age of Onset  . Heart disease Mother   . Prostate cancer Father   . Colon polyps Father   . Heart disease Father     History  Substance Use Topics  . Smoking status: Never Smoker   . Smokeless tobacco: Never Used  . Alcohol Use: No    ROS: As per history of present illness, otherwise negative  BP 100/76  Pulse 60  Ht 5\' 4"  (1.626 m)  Wt 151 lb 6.4 oz (68.675 kg)  BMI 25.98 kg/m2 Constitutional: Well-developed and well-nourished. No distress. HEENT: Normocephalic and atraumatic. Oropharynx is clear and moist. No oropharyngeal exudate. Conjunctivae are normal.  No scleral icterus. Neck: Neck supple. Trachea midline. Cardiovascular: Normal rate, regular rhythm and intact distal pulses. No M/R/G Pulmonary/chest: Effort normal and breath sounds normal. No wheezing, rales or rhonchi. Abdominal: Soft, nontender, nondistended. Bowel sounds active throughout. There are no masses palpable.  Back: Tenderness to palpation in the right mid back 2-3 cm right of the spinous process without palpable mass, no  rash over this area Extremities: no clubbing, cyanosis, or edema Lymphadenopathy: No cervical adenopathy noted. Neurological: Alert and oriented to person place and time. Skin: Skin is warm and dry. No rashes noted. Psychiatric: Normal mood and affect. Behavior is normal.  RELEVANT LABS AND IMAGING: CBC    Component Value Date/Time   WBC 9.9 10/11/2012 1737   RBC 4.49 10/11/2012 1737   HGB 13.2 10/11/2012 1737   HCT 38.0 10/11/2012 1737   PLT 255 10/11/2012 1737   MCV 84.6 10/11/2012 1737   MCH 29.4 10/11/2012 1737   MCHC 34.7  10/11/2012 1737   RDW 14.2 10/11/2012 1737   LYMPHSABS 2.0 03/18/2010 2149   MONOABS 0.5 03/18/2010 2149   EOSABS 0.1 03/18/2010 2149   BASOSABS 0.0 03/18/2010 2149    CMP     Component Value Date/Time   NA 139 01/09/2013 1550   K 3.7 01/09/2013 1550   CL 104 01/09/2013 1550   CO2 28 01/09/2013 1550   GLUCOSE 83 01/09/2013 1550   BUN 9 01/09/2013 1550   CREATININE 0.63 01/09/2013 1550   CREATININE 0.71 10/11/2012 1737   CALCIUM 9.3 01/09/2013 1550   PROT 8.4* 10/11/2012 1737   ALBUMIN 3.9 10/11/2012 1737   AST 16 10/11/2012 1737   ALT 17 10/11/2012 1737   ALKPHOS 147* 10/11/2012 1737   BILITOT 0.3 10/11/2012 1737   GFRNONAA >90 10/11/2012 1737   GFRAA >90 10/11/2012 1737   Clinical Data: 62 year old female with right flank pain.  Right upper quadrant pain just below the ribs.   CT ABDOMEN AND PELVIS WITH CONTRAST -- 01/15/2013   Technique:  Multidetector CT imaging of the abdomen and pelvis was performed following the standard protocol during bolus administration of intravenous contrast.   Contrast: OMNIPAQUE IOHEXOL 300 MG/ML  SOLN   Comparison: Renal ultrasound 03/25/2010.   Findings: No pericardial or pleural effusion.  Mild lung base atelectasis or scarring.  Visualized right ribs are unremarkable. No acute osseous abnormality identified.   No pelvic free fluid.  Uterus surgically absent.  Adnexa normal or surgically absent.  Negative distal colon except for retained stool.  Oral contrast has reached the descending colon segment. Redundant transverse colon.  Hepatic flexure mildly redundant. Ascending colon is normal.  The cecum is partially located in the anterior right pelvis.  Terminal ileum appears normal.  The appendix is opacified but normal.  Distal small bowel is within normal limits.  However, jejunum is remarkable for numerous diverticula, ranging from only a few millimeters (coronal image 37 and axial image 40) up to 3 cm (axial image 37).  The diverticula contain  contrast fluid levels.  No associated small bowel wall thickening or mesenteric stranding identified.   The stomach and duodenum appear within normal limits.   The gallbladder is surgically absent.  The liver is negative except for mildly decreased density throughout.  Negative spleen. Pancreas and adrenal glands are within normal limits.  The kidneys are normal except for a simple appearing 18 mm left renal upper pole cyst.   The portal venous system and major arterial structures in the abdomen and pelvis are within normal limits.  No abdominal free fluid or lymphadenopathy.  in size.   IMPRESSION: 1.  No inflammatory process identified. 2.  Jejunal diverticulosis; numerous diverticuli ranging from several millimeters up to 3 cm in diameter. These can be associated with: malabsorption, bacterial overgrowth, intestinal pseudo-obstruction, stasis, and rarely can be a cause of perforation with peritonitis. 3.  Hepatic steatosis.  ASSESSMENT/PLAN: 62 year old female with a past medical history of hypertension and adenomatous colon polyps who is seen in consultation at the request of Dr. Laural Benes to evaluate right mid back pain and abnormal GI imaging.   1.  Right mid-back pain -- the character and nature of her pain is not consistent with GI related pathology. It very much seems musculoskeletal in nature I will give her a prescription for Flexeril to be used 3 times daily as needed for muscle spasm. Also will perform thoracic and lumbar x-ray today to evaluate for changes of osteoarthritis and rule out compression fracture.  If she has arthritis or benefits from Flexeril, recommend orthopedics evaluation given the persistence of this pain.  2.  Jejunal diverticulosis -- CT scan performed in July showed jejunal diverticulosis without evidence of inflammation. She does not have signs or symptoms of diverticulitis at present. There is also no other sign of pseudoobstruction, stasis or  bacterial overgrowth. We discussed this problem and how it likely will never bother her, however if she were to develop abdominal pain, fevers, chills she is asked to notify me and she voices understanding.  There are no known dietary modifications to prevent small bowel diverticulitis at present.  3.  Hepatic steatosis -- fatty liver by CT imaging however her transaminases are normal.  Fatty liver without transaminase elevation is lower risk given the lack of hepatitis. I recommend annual liver function panel testing at complete physical exam. If liver enzymes become elevated, we can further evaluate and treat fatty liver disease. I did discuss how weight loss overall will improve fatty liver.

## 2013-02-11 NOTE — Patient Instructions (Addendum)
Your physician has requested that you go to the basement for and X-ray of your spine  We have sent the following medications to your pharmacy for you to pick up at your convenience: Flexeril; take three times a day as needed for back muscle spasms  Call us in 2-4 weeks to let us know how you are doing.                                               We are excited to introduce MyChart, a new best-in-class service that provides you online access to important information in your electronic medical record. We want to make it easier for you to view your health information - all in one secure location - when and where you need it. We expect MyChart will enhance the quality of care and service we provide.  When you register for MyChart, you can:    View your test results.    Request appointments and receive appointment reminders via email.    Request medication renewals.    View your medical history, allergies, medications and immunizations.    Communicate with your physician's office through a password-protected site.    Conveniently print information such as your medication lists.  To find out if MyChart is right for you, please talk to a member of our clinical staff today. We will gladly answer your questions about this free health and wellness tool.  If you are age 60 or older and want a member of your family to have access to your record, you must provide written consent by completing a proxy form available at our office. Please speak to our clinical staff about guidelines regarding accounts for patients younger than age 47.  As you activate your MyChart account and need any technical assistance, please call the MyChart technical support line at [phone]) or email your question to [email or web address]. If you email your question(s), please include your name, a return phone number and the best time to reach you.  If you have non-urgent health-related questions, you  can send a message to our office through MyChart at [email or web address]. If you have a medical emergency, call 911.  Thank you for using MyChart as your new health and wellness resource!   MyChart licensed from [employer],  [id number]. Patents Pending.

## 2013-02-13 ENCOUNTER — Other Ambulatory Visit: Payer: No Typology Code available for payment source

## 2013-02-13 ENCOUNTER — Telehealth: Payer: Self-pay | Admitting: *Deleted

## 2013-02-13 NOTE — Telephone Encounter (Signed)
Pt called back and would like an orthopedic referral; no particular doctor. Called [facility] and pt has an bad debt balance from 2005 for >$ 350 so they cannot see her.

## 2013-02-17 ENCOUNTER — Other Ambulatory Visit: Payer: Self-pay | Admitting: Obstetrics and Gynecology

## 2013-02-17 DIAGNOSIS — R928 Other abnormal and inconclusive findings on diagnostic imaging of breast: Secondary | ICD-10-CM

## 2013-02-18 ENCOUNTER — Encounter (HOSPITAL_COMMUNITY): Payer: Self-pay

## 2013-02-18 ENCOUNTER — Ambulatory Visit (HOSPITAL_COMMUNITY)
Admission: RE | Admit: 2013-02-18 | Discharge: 2013-02-18 | Disposition: A | Discharge status: Home / Self Care | Payer: No Typology Code available for payment source | Source: Ambulatory Visit | Attending: Obstetrics and Gynecology | Admitting: Obstetrics and Gynecology

## 2013-02-18 VITALS — BP 102/76 | Temp 98.7°F | Ht 64.5 in | Wt 150.0 lb

## 2013-02-18 DIAGNOSIS — Z01419 Encounter for gynecological examination (general) (routine) without abnormal findings: Secondary | ICD-10-CM

## 2013-02-18 HISTORY — DX: Unspecified asthma, uncomplicated: J45.909

## 2013-02-18 NOTE — Progress Notes (Signed)
Patient referred to [facility] by the [facility] due to needing additional imaging of the left breast. Screening mammogram completed 01/24/2013 at [facility].  Pap Smear:    Pap smear completed today. Per patient last Pap smear was around 4 years in [place] and patient does not know the result to that Pap smear. Per patient she otherwise has no history of an abnormal Pap smear. Patient has a history of a hysterectomy 29 years ago for a benign growth per patient. Informed patient that if today's Pap smear is normal that she does not need any further Pap smears per BCCCP and ACOG guidelines. No Pap smear results in EPIC.  Physical exam: Breasts Breasts symmetrical. No skin abnormalities bilateral breasts. No nipple retraction bilateral breasts. No nipple discharge bilateral breasts. No lymphadenopathy. No lumps palpated bilateral breasts. No complaints of pain or tenderness on exam. Referred patient to the [facility] for left breast diagnostic mammogram per recommendation. Appointment scheduled for Thursday, March 06, 2013 at 1300.          Pelvic/Bimanual   Ext Genitalia No lesions, no swelling and no discharge observed on external genitalia.         Vagina Vagina pink and normal texture. No lesions or discharge observed in vagina.          Cervix Cervix is absent due to a history of a hysterectomy.    Uterus Uterus is absent due to a history of a hysterectomy.   Adnexae Per patient left ovary is present and right ovary was surgically removed when had hysterectomy. Unable to palpate ovary on exam. No tenderness on palpation.          Rectovaginal No rectal exam completed today since patient had no rectal complaints. No skin abnormalities observed on exam.

## 2013-02-18 NOTE — Patient Instructions (Signed)
Taught Donnita [place] how to perform BSE and gave educational materials to take home. Let patient know that if today's Pap smear is normal that she does not need any further Pap smears. Referred patient to the [facility] for left breast diagnostic mammogram per recommendation. Appointment scheduled for Thursday, March 06, 2013 at 1300. Patient aware of appointment and will be there. Let patient know will follow up with her within the next couple weeks with results by phone. Donnita Falls verbalized understanding.  Shaka Cardin, Kathaleen Maser, RN 3:59 PM

## 2013-02-21 ENCOUNTER — Telehealth (HOSPITAL_COMMUNITY): Payer: Self-pay | Admitting: *Deleted

## 2013-02-21 NOTE — Telephone Encounter (Signed)
Telephoned patient at home # and discussed negative results of pap smear. Advised patient since hysterectomy was for non cancerous reasons would no longer need pap smears. Patient voiced understanding.

## 2013-02-25 NOTE — Telephone Encounter (Signed)
Informed pt I will refer her to [facility] on 03/04/13. This organization helps people with orange cards see specialists. The money is distributed at the 1st of each month to specialists on a first come 1st serve basis and hopefully an ortho will see the pt. Pt stated understanding.

## 2013-02-27 ENCOUNTER — Encounter: Payer: Self-pay | Admitting: Obstetrics & Gynecology

## 2013-03-04 ENCOUNTER — Ambulatory Visit
Admission: RE | Admit: 2013-03-04 | Discharge: 2013-03-04 | Disposition: A | Discharge status: Home / Self Care | Payer: No Typology Code available for payment source | Source: Ambulatory Visit | Attending: Obstetrics and Gynecology | Admitting: Obstetrics and Gynecology

## 2013-03-04 ENCOUNTER — Other Ambulatory Visit: Payer: Self-pay | Admitting: Obstetrics and Gynecology

## 2013-03-04 DIAGNOSIS — R928 Other abnormal and inconclusive findings on diagnostic imaging of breast: Secondary | ICD-10-CM

## 2013-03-05 NOTE — Telephone Encounter (Signed)
Faxed info to [facility] on 03/04/13 for possible ortho assistance.

## 2013-03-06 ENCOUNTER — Other Ambulatory Visit: Payer: No Typology Code available for payment source

## 2013-03-10 ENCOUNTER — Other Ambulatory Visit: Payer: No Typology Code available for payment source

## 2013-03-14 NOTE — Telephone Encounter (Addendum)
lmom for pt to call back. I never heard whether she will receive assistance.

## 2013-03-17 ENCOUNTER — Telehealth: Payer: Self-pay | Admitting: Internal Medicine

## 2013-03-17 DIAGNOSIS — M419 Scoliosis, unspecified: Secondary | ICD-10-CM

## 2013-03-17 DIAGNOSIS — M549 Dorsalgia, unspecified: Secondary | ICD-10-CM

## 2013-03-17 NOTE — Telephone Encounter (Signed)
Called and left message that Aram Beecham is out of the office and will return Wed.

## 2013-03-19 NOTE — Telephone Encounter (Signed)
Pt never heard from [facility] either. She does see doctors at [facility], [phone], and she asked that I inform them of the need for a referral. Left a message for the referral coordinator to call me back.

## 2013-03-19 NOTE — Telephone Encounter (Signed)
Spoke with Arna Medici at [facility] , [phone], and referral was made to [facility]. lmom for pt to call.

## 2013-03-19 NOTE — Telephone Encounter (Signed)
See telephone encounter.

## 2013-03-20 NOTE — Telephone Encounter (Signed)
Pt called me and left a VM and I called her this am and left a VM.

## 2013-03-21 NOTE — Telephone Encounter (Signed)
Pt reports she has an appt on 04/07/13 with Dr Margaretha Sheffield at [facility].

## 2013-04-03 ENCOUNTER — Ambulatory Visit: Payer: Self-pay | Admitting: Sports Medicine

## 2013-04-07 ENCOUNTER — Ambulatory Visit (INDEPENDENT_AMBULATORY_CARE_PROVIDER_SITE_OTHER): Payer: No Typology Code available for payment source | Admitting: Sports Medicine

## 2013-04-07 ENCOUNTER — Encounter: Payer: Self-pay | Admitting: Sports Medicine

## 2013-04-07 VITALS — BP 118/82 | Ht 64.0 in | Wt 152.0 lb

## 2013-04-07 DIAGNOSIS — M419 Scoliosis, unspecified: Secondary | ICD-10-CM

## 2013-04-07 DIAGNOSIS — M4120 Other idiopathic scoliosis, site unspecified: Secondary | ICD-10-CM

## 2013-04-07 DIAGNOSIS — M4180 Other forms of scoliosis, site unspecified: Secondary | ICD-10-CM

## 2013-04-07 NOTE — Assessment & Plan Note (Addendum)
Pt with recent thoracolumbar x-rays showing about a 10 degree levoscoliosis.  She has hypertonicity on the R paraspinal musculature with some pain slightly inferior to the scapula on the R as well.  Most likely chronic changes causing her pain and she will benefit from PT.  Referral to PT for evaluation and treatment placed today and she can continue with the flexeril she received from the ED if this provides her benefit. Patient instructed to try moist heat for pain control.  She can take ibuprofen or tylenol for her pain acutely and we will f/u with her in 4 weeks.  No red flags including bowel/bladder incontinence, weight loss, fever, chills, night time awakenings.

## 2013-04-07 NOTE — Patient Instructions (Addendum)
Scoliosis Scoliosis is the name given to a spine that curves sideways. It is a common condition found in up to ten percent of adolescents. It is more common in teenage girls. This is sometimes the result of other underlying problems such as unequal leg length or muscular problems. Approximately 70% of the time the cause unknown. It can cause twisting of the shoulders, hips, chest, back, and rib cage. Exercises generally do not affect the course of this disease, but may be helpful in strengthening weak muscle groups. Orthopedic braces may be needed during growth spurts. Surgery may be necessary for progressive cases. HOME CARE INSTRUCTIONS   Your caregiver may suggest exercises to strengthen your muscles. Follow their instructions. Ask your caregiver if you can participate in sports activities.  Bracing may be needed to try to limit the progression of the spinal curve. Wear the brace as instructed by your caregiver.  Follow-up appointments are important. Often mild cases of scoliosis can be kept track of by regular physical exams. However, periodic x-rays may be taken in more severe cases to follow the progress of the curvature, especially with brace treatment. Scoliosis can be corrected or improved if treated early. SEEK IMMEDIATE MEDICAL CARE IF:  You have back pain that is not relieved by medications prescribed by your caregiver.  If there is weakness or increased muscle tone (spasticity) in your legs or any loss of bowel or bladder control. Document Released: 06/16/2000 Document Revised: 09/11/2011 Document Reviewed: 07/06/2008 [employer] Patient Information 2014 [employer].  

## 2013-04-07 NOTE — Progress Notes (Signed)
Emily Clarke is a 62 y.o. who presents today for right lower back pain.  Pt states this has been ongoing for the past two years, insidious in onset.  No inciting injury, previous back injury, or history of cancer.  Initially pain was dull achy, worse with movement that has progressed to sharp with movement over the past 3-4 months.  Has f/u with her PCP over the last two years, repeatedly told that it was a muscle strain, given NSAID and flexeril, which has provided her minimum relief.  Pt location of pain is limited to the R thoracic region w/o radiation to the anterior, midline, or down either leg.  Now pain is intermittent, can be dull/achy at rest, and can become sharp with any type of movement, but does deny any radiation at the time of movement.  Movement which exacerbates her pain the most is L rotation or moving her R arm across her body.  Denies SOB, CP, rashes, weight loss, night time awakenings, bowel/bladder incontinence, fevers, chills, sweats.     Past Medical History  Diagnosis Date  . Tachycardia   . Hypertension   . Asthma     History   Social History  . Marital Status: Divorced    Spouse Name: N/A    Number of Children: 3  . Years of Education: N/A   Occupational History  .     Social History Main Topics  . Smoking status: Never Smoker   . Smokeless tobacco: Never Used  . Alcohol Use: No  . Drug Use: No  . Sexual Activity: Yes    Birth Control/ Protection: Surgical   Other Topics Concern  . Not on file   Social History Narrative  . No narrative on file    Family History  Problem Relation Age of Onset  . Heart disease Mother   . Prostate cancer Father   . Colon polyps Father   . Heart disease Father     Current Outpatient Prescriptions on File Prior to Visit  Medication Sig Dispense Refill  . albuterol (PROVENTIL HFA;VENTOLIN HFA) 108 (90 BASE) MCG/ACT inhaler Inhale 2 puffs into the lungs every 6 (six) hours as needed for wheezing.  1 Inhaler  2  .  aspirin EC 81 MG tablet Take 81 mg by mouth daily.      . cyclobenzaprine (FLEXERIL) 10 MG tablet Take 1 tablet (10 mg total) by mouth 3 (three) times daily as needed for muscle spasms.  30 tablet  0  . hydrochlorothiazide (HYDRODIURIL) 12.5 MG tablet Take 1 tablet (12.5 mg total) by mouth daily.  30 tablet  3  . metoprolol succinate (TOPROL-XL) 25 MG 24 hr tablet Take 1 tablet (25 mg total) by mouth daily.  30 tablet  3   No current facility-administered medications on file prior to visit.    Review of Symptoms 11 point ROS reviewed showing HTN and back pain. Otherwise, negative  Physical Exam Filed Vitals:   04/07/13 1524  BP: 118/82   Gen: NAD, Well nourished, Well developed HEENT: EOMI, [place]/AT Neck: no JVD Cardio: RRR, No murmurs Lungs: CTA MSK: See below for Back exam.  Otherwise, negative  Neurovascular intact distal extremities B/L  Psych: AAO x 3  Back Exam: Gait nml. No hair tufts, obvious thoracolumbar scoliosis, edema, elevated rib on either side TTP along R thoracic paraspinal region with hypertonicity noted. No midline lumbar/thoracic TTP or SI jt TTP Forward flexion 100 degrees, Extension 10-20 degrees, decreased left thoracic rotation secondary to  pain, otherwise nml SB/R B/L Lower extremity MS 5/5 B/L  DTR 2+ B/L Patella/Achilles  Negative Straight Leg Raise, Stork test, and slump test B/L   Thoracolumbar X-rays 02/11/13 reviewed - About 10 degrees levoscoliosis present in her thoracic vertebrae as well as mild anterior vertebral spurring in this region.

## 2013-04-18 ENCOUNTER — Ambulatory Visit: Payer: No Typology Code available for payment source | Attending: Internal Medicine | Admitting: Internal Medicine

## 2013-04-18 VITALS — BP 131/88 | HR 78 | Temp 97.7°F | Resp 18 | Wt 154.4 lb

## 2013-04-18 DIAGNOSIS — I10 Essential (primary) hypertension: Secondary | ICD-10-CM

## 2013-04-18 DIAGNOSIS — R7303 Prediabetes: Secondary | ICD-10-CM

## 2013-04-18 DIAGNOSIS — Z23 Encounter for immunization: Secondary | ICD-10-CM

## 2013-04-18 DIAGNOSIS — R7309 Other abnormal glucose: Secondary | ICD-10-CM

## 2013-04-18 DIAGNOSIS — M4120 Other idiopathic scoliosis, site unspecified: Secondary | ICD-10-CM

## 2013-04-18 NOTE — Progress Notes (Signed)
Patient ID: Emily Clarke, female   DOB: 08-20-1950, 62 y.o.   MRN: [id number]  History of present illness 62 year old female with prediabetes who was seen in the orthopedic clinic recently for low back pain. X-ray of the thoracic and lumbar spine showed scoliosis of the thoracic spine. She was given a prescription for Flexeril and referred to physical therapy. Patient here for followup visit. Reports her back pain plan much improved. She has physical therapy scheduled in 2 weeks. Denies any headache, dizziness, chest pain, palpitations, fever, nausea, vomiting, abdominal pain, bowel or urinary symptoms. She requests for a flu vaccine.  Vital signs in last 24 hours:  Filed Vitals:   04/18/13 1455  BP: 131/88  Pulse: 78  Temp: 97.7 F (36.5 C)  Resp: 18  Weight: 154 lb 6.4 oz (70.035 kg)  SpO2: 100%      Physical Exam:  General: Middle aged female in no acute distress. HEENT: no pallor, no icterus, moist oral mucosa,  Heart: Normal  s1 &s2  Regular rate and rhythm, without murmurs, rubs, gallops. Lungs: Clear to auscultation bilaterally. Extremities: Warm, no edema Neuro: Alert, awake, oriented x3, nonfocal.   Lab Results:  Basic Metabolic Panel:    Component Value Date/Time   NA 139 01/09/2013 1550   K 3.7 01/09/2013 1550   CL 104 01/09/2013 1550   CO2 28 01/09/2013 1550   BUN 9 01/09/2013 1550   CREATININE 0.63 01/09/2013 1550   CREATININE 0.71 10/11/2012 1737   GLUCOSE 83 01/09/2013 1550   CALCIUM 9.3 01/09/2013 1550   CBC:    Component Value Date/Time   WBC 9.9 10/11/2012 1737   HGB 13.2 10/11/2012 1737   HCT 38.0 10/11/2012 1737   PLT 255 10/11/2012 1737   MCV 84.6 10/11/2012 1737   NEUTROABS 4.4 03/18/2010 2149   LYMPHSABS 2.0 03/18/2010 2149   MONOABS 0.5 03/18/2010 2149   EOSABS 0.1 03/18/2010 2149   BASOSABS 0.0 03/18/2010 2149    No results found for this or any previous visit (from the past 240 hour(s)).  Studies/Results: No results  found.  Medications:  Assessment/Plan: Thoracic scoliosis  Seen by ortho recently. Referred to PT. Has PT scheduled in 2 weeks. Pain improved with flexeril.  Prediabetes  counseled on diet adherence and exercise  Hypertension Blood pressure stable. Continue her medication  Health maintenance  uptodate with PAP and mammogram. Has had colonoscopy in last 5 years.  Flu vaccine ordered  Follow up in 6 months  Penina Reisner 04/18/2013, 3:03 PM

## 2013-04-18 NOTE — Addendum Note (Signed)
Addended by: Eddie North on: 04/18/2013 03:15 PM   Modules accepted: Orders

## 2013-04-18 NOTE — Progress Notes (Signed)
Patient here for follow up on her back pain Was seen by ortho and told she has a curve to her spine

## 2013-05-05 ENCOUNTER — Ambulatory Visit (INDEPENDENT_AMBULATORY_CARE_PROVIDER_SITE_OTHER): Payer: No Typology Code available for payment source | Admitting: Sports Medicine

## 2013-05-05 ENCOUNTER — Encounter: Payer: Self-pay | Admitting: Sports Medicine

## 2013-05-05 ENCOUNTER — Ambulatory Visit: Payer: No Typology Code available for payment source | Attending: Internal Medicine

## 2013-05-05 VITALS — BP 133/89 | Ht 64.5 in | Wt 152.0 lb

## 2013-05-05 DIAGNOSIS — M549 Dorsalgia, unspecified: Secondary | ICD-10-CM

## 2013-05-05 DIAGNOSIS — M62838 Other muscle spasm: Secondary | ICD-10-CM

## 2013-05-05 MED ORDER — CYCLOBENZAPRINE HCL 10 MG PO TABS: 10.0000 mg | ORAL_TABLET | Freq: Three times a day (TID) | ORAL | Status: DC | PRN

## 2013-05-06 NOTE — Progress Notes (Signed)
  Subjective:    Patient ID: Emily Clarke, female    DOB: 1951-02-12, 62 y.o.   MRN: [id number]  HPI Patient comes in today for followup on back pain. Unfortunately, she has yet to start physical therapy. Previous x-rays showed a 10 scoliotic curve in her thoracic spine. Main complaint continues to be pain and spasm along the right mid to lower back. Flexeril does seem to help. No radiating pain into her legs.    Review of Systems     Objective:   Physical Exam Sitting comfortably in the exam room. No acute distress  There is still palpable spasm and diffuse tenderness around the paraspinal musculature just to the right of the mid to lower thoracic spine. No midline tenderness. No focal neurological deficits of either lower extremity.       Assessment & Plan:  Back pain and spasm secondary to underlying scoliosis  I will reorder physical therapy and I will refill her Flexeril. Followup in 4 weeks.

## 2013-05-12 ENCOUNTER — Ambulatory Visit: Payer: No Typology Code available for payment source | Admitting: Physical Therapy

## 2013-05-19 ENCOUNTER — Ambulatory Visit: Payer: No Typology Code available for payment source | Admitting: Physical Therapy

## 2013-06-02 ENCOUNTER — Ambulatory Visit: Payer: Self-pay | Admitting: Sports Medicine

## 2013-06-03 ENCOUNTER — Ambulatory Visit: Payer: Self-pay | Admitting: Sports Medicine

## 2013-06-05 ENCOUNTER — Encounter: Payer: Self-pay | Admitting: Sports Medicine

## 2013-06-05 ENCOUNTER — Ambulatory Visit (INDEPENDENT_AMBULATORY_CARE_PROVIDER_SITE_OTHER): Payer: No Typology Code available for payment source | Admitting: Sports Medicine

## 2013-06-05 VITALS — BP 148/96 | HR 65 | Ht 64.54 in | Wt 152.0 lb

## 2013-06-05 DIAGNOSIS — M4120 Other idiopathic scoliosis, site unspecified: Secondary | ICD-10-CM

## 2013-06-05 DIAGNOSIS — M419 Scoliosis, unspecified: Secondary | ICD-10-CM

## 2013-06-06 NOTE — Progress Notes (Signed)
Patient ID: Emily Clarke, female   DOB: [birth year], 62 y.o.   MRN: [id number]  Patient comes in today by accident. She thought she was going to be seeing physical therapy today. Her back pain has improved with when necessary Flexeril but she has yet to start physical therapy. I gave her a new prescription to start [facility] and instructed her to followup with me in 4 weeks.

## 2013-08-01 ENCOUNTER — Ambulatory Visit: Payer: No Typology Code available for payment source | Attending: Internal Medicine

## 2013-08-01 ENCOUNTER — Encounter: Payer: Self-pay | Admitting: Internal Medicine

## 2013-08-01 ENCOUNTER — Ambulatory Visit: Payer: No Typology Code available for payment source | Attending: Internal Medicine | Admitting: Internal Medicine

## 2013-08-01 VITALS — BP 131/89 | HR 92 | Temp 98.9°F | Resp 14 | Ht 64.0 in | Wt 160.2 lb

## 2013-08-01 DIAGNOSIS — R059 Cough, unspecified: Secondary | ICD-10-CM | POA: Insufficient documentation

## 2013-08-01 DIAGNOSIS — J40 Bronchitis, not specified as acute or chronic: Secondary | ICD-10-CM | POA: Insufficient documentation

## 2013-08-01 DIAGNOSIS — J3489 Other specified disorders of nose and nasal sinuses: Secondary | ICD-10-CM | POA: Insufficient documentation

## 2013-08-01 DIAGNOSIS — R05 Cough: Secondary | ICD-10-CM | POA: Insufficient documentation

## 2013-08-01 DIAGNOSIS — R0981 Nasal congestion: Secondary | ICD-10-CM

## 2013-08-01 MED ORDER — METOPROLOL SUCCINATE ER 25 MG PO TB24: 25.0000 mg | ORAL_TABLET | Freq: Every day | ORAL | Status: DC

## 2013-08-01 MED ORDER — FLUTICASONE PROPIONATE 50 MCG/ACT NA SUSP: 2.0000 | Freq: Every day | NASAL | Status: DC

## 2013-08-01 MED ORDER — HYDROCHLOROTHIAZIDE 12.5 MG PO TABS: 12.5000 mg | ORAL_TABLET | Freq: Every day | ORAL | Status: DC

## 2013-08-01 MED ORDER — BENZONATATE 100 MG PO CAPS: 100.0000 mg | ORAL_CAPSULE | Freq: Three times a day (TID) | ORAL | Status: DC | PRN

## 2013-08-01 MED ORDER — AZITHROMYCIN 250 MG PO TABS: ORAL_TABLET | ORAL | Status: DC

## 2013-08-01 NOTE — Progress Notes (Signed)
MRN: [id number] Name: Emily Clarke  Sex: female Age: 63 y.o. DOB: 1950-09-25  Allergies: Review of patient's allergies indicates no known allergies.  Chief Complaint  Patient presents with  . office visit    cold, cough    HPI: Patient is 63 y.o. female who comes today reported to have ongoing productive cough for the last one month, sputum is yellowish in color has some nasal congestion denies any fever chills Chest pain or shortness of breath, she denies smoking cigarettes, she's tried over-the-counter medication without much improvement she also history of hypertension and is requesting refill on her medications.  Past Medical History  Diagnosis Date  . Tachycardia   . Hypertension   . Asthma     Past Surgical History  Procedure Laterality Date  . Abdominal hysterectomy    . Cholecystectomy        Medication List       This list is accurate as of: 08/01/13 12:51 PM.  Always use your most recent med list.               albuterol 108 (90 BASE) MCG/ACT inhaler  Commonly known as:  PROVENTIL HFA;VENTOLIN HFA  Inhale 2 puffs into the lungs every 6 (six) hours as needed for wheezing.     aspirin EC 81 MG tablet  Take 81 mg by mouth daily.     azithromycin 250 MG tablet  Commonly known as:  ZITHROMAX Z-PAK  Take as directed     benzonatate 100 MG capsule  Commonly known as:  TESSALON  Take 1 capsule (100 mg total) by mouth 3 (three) times daily as needed for cough.     cyclobenzaprine 10 MG tablet  Commonly known as:  FLEXERIL  Take 1 tablet (10 mg total) by mouth 3 (three) times daily as needed for muscle spasms.     fluticasone 50 MCG/ACT nasal spray  Commonly known as:  FLONASE  Place 2 sprays into both nostrils daily.     hydrochlorothiazide 12.5 MG tablet  Commonly known as:  HYDRODIURIL  Take 1 tablet (12.5 mg total) by mouth daily.     metoprolol succinate 25 MG 24 hr tablet  Commonly known as:  TOPROL-XL  Take 1 tablet (25 mg total) by mouth  daily.        Meds ordered this encounter  Medications  . azithromycin (ZITHROMAX Z-PAK) 250 MG tablet    Sig: Take as directed    Dispense:  6 each    Refill:  0  . fluticasone (FLONASE) 50 MCG/ACT nasal spray    Sig: Place 2 sprays into both nostrils daily.    Dispense:  16 g    Refill:  6  . benzonatate (TESSALON) 100 MG capsule    Sig: Take 1 capsule (100 mg total) by mouth 3 (three) times daily as needed for cough.    Dispense:  30 capsule    Refill:  1  . hydrochlorothiazide (HYDRODIURIL) 12.5 MG tablet    Sig: Take 1 tablet (12.5 mg total) by mouth daily.    Dispense:  30 tablet    Refill:  3  . metoprolol succinate (TOPROL-XL) 25 MG 24 hr tablet    Sig: Take 1 tablet (25 mg total) by mouth daily.    Dispense:  30 tablet    Refill:  3    Immunization History  Administered Date(s) Administered  . Influenza, Seasonal, Injecte, Preservative Fre 04/18/2013  . Tdap 08/28/2012    Family History  Problem Relation Age of Onset  . Heart disease Mother   . Prostate cancer Father   . Colon polyps Father   . Heart disease Father     History  Substance Use Topics  . Smoking status: Never Smoker   . Smokeless tobacco: Never Used  . Alcohol Use: No    Review of Systems  As noted in HPI  Filed Vitals:   08/01/13 1230  BP: 131/89  Pulse: 92  Temp: 98.9 F (37.2 C)  Resp: 14    Physical Exam  Physical Exam  Constitutional: No distress.  HENT:  Nasal congestion no sinus tenderness  Cardiovascular: Normal rate and regular rhythm.   Pulmonary/Chest: Breath sounds normal. No respiratory distress. She has no wheezes. She has no rales.    CBC    Component Value Date/Time   WBC 9.9 10/11/2012 1737   RBC 4.49 10/11/2012 1737   HGB 13.2 10/11/2012 1737   HCT 38.0 10/11/2012 1737   PLT 255 10/11/2012 1737   MCV 84.6 10/11/2012 1737   LYMPHSABS 2.0 03/18/2010 2149   MONOABS 0.5 03/18/2010 2149   EOSABS 0.1 03/18/2010 2149   BASOSABS 0.0 03/18/2010 2149     CMP     Component Value Date/Time   NA 139 01/09/2013 1550   K 3.7 01/09/2013 1550   CL 104 01/09/2013 1550   CO2 28 01/09/2013 1550   GLUCOSE 83 01/09/2013 1550   BUN 9 01/09/2013 1550   CREATININE 0.63 01/09/2013 1550   CREATININE 0.71 10/11/2012 1737   CALCIUM 9.3 01/09/2013 1550   PROT 8.4* 10/11/2012 1737   ALBUMIN 3.9 10/11/2012 1737   AST 16 10/11/2012 1737   ALT 17 10/11/2012 1737   ALKPHOS 147* 10/11/2012 1737   BILITOT 0.3 10/11/2012 1737   GFRNONAA >90 10/11/2012 1737   GFRAA >90 10/11/2012 1737    Lab Results  Component Value Date/Time   CHOL 195 10/11/2012  5:37 PM    No components found with this basename: hga1c    Lab Results  Component Value Date/Time   AST 16 10/11/2012  5:37 PM    Assessment and Plan  Bronchitis - Plan: azithromycin (ZITHROMAX Z-PAK) 250 MG tablet  Cough - Plan: benzonatate (TESSALON) 100 MG capsule  Nasal congestion - Plan: fluticasone (FLONASE) 50 MCG/ACT nasal spray  Medication refill done.  Return in about 3 months (around 10/30/2013), or if symptoms worsen or fail to improve.  Lorayne Marek, MD

## 2013-08-01 NOTE — Progress Notes (Signed)
Pt is here for an office visit. Complains of a cough and cold x1 month; dry cough, thick mucus. Tried OTC medications and nothing works.

## 2013-08-11 ENCOUNTER — Emergency Department (HOSPITAL_COMMUNITY): Payer: No Typology Code available for payment source

## 2013-08-11 ENCOUNTER — Observation Stay (HOSPITAL_COMMUNITY)
Admission: EM | Admit: 2013-08-11 | Discharge: 2013-08-12 | Disposition: A | Discharge status: Home / Self Care | Payer: No Typology Code available for payment source | Attending: Cardiology | Admitting: Cardiology

## 2013-08-11 ENCOUNTER — Encounter (HOSPITAL_COMMUNITY): Payer: Self-pay | Admitting: Emergency Medicine

## 2013-08-11 DIAGNOSIS — R05 Cough: Secondary | ICD-10-CM

## 2013-08-11 DIAGNOSIS — R000 Tachycardia, unspecified: Secondary | ICD-10-CM | POA: Insufficient documentation

## 2013-08-11 DIAGNOSIS — I471 Supraventricular tachycardia, unspecified: Secondary | ICD-10-CM

## 2013-08-11 DIAGNOSIS — R059 Cough, unspecified: Secondary | ICD-10-CM

## 2013-08-11 DIAGNOSIS — R053 Chronic cough: Secondary | ICD-10-CM

## 2013-08-11 DIAGNOSIS — J45909 Unspecified asthma, uncomplicated: Secondary | ICD-10-CM

## 2013-08-11 DIAGNOSIS — Z8709 Personal history of other diseases of the respiratory system: Secondary | ICD-10-CM

## 2013-08-11 DIAGNOSIS — R079 Chest pain, unspecified: Secondary | ICD-10-CM | POA: Diagnosis present

## 2013-08-11 DIAGNOSIS — J45901 Unspecified asthma with (acute) exacerbation: Secondary | ICD-10-CM | POA: Insufficient documentation

## 2013-08-11 DIAGNOSIS — I10 Essential (primary) hypertension: Secondary | ICD-10-CM | POA: Insufficient documentation

## 2013-08-11 DIAGNOSIS — R0789 Other chest pain: Principal | ICD-10-CM | POA: Insufficient documentation

## 2013-08-11 LAB — CREATININE, SERUM
Creatinine, Ser: 0.78 mg/dL (ref 0.50–1.10)
GFR calc Af Amer: >90 mL/min (ref >90)
GFR calc non Af Amer: 89 mL/min — ABNORMAL LOW (ref >90)

## 2013-08-11 LAB — CBC
HCT: 39.5 % (ref 36.0–46.0)
HCT: 36.7 % (ref 36.0–46.0)
Hemoglobin: 13.5 g/dL (ref 12.0–15.0)
Hemoglobin: 12.3 g/dL (ref 12.0–15.0)
MCH: 29.8 pg (ref 26.0–34.0)
MCH: 29.6 pg (ref 26.0–34.0)
MCHC: 34.2 g/dL (ref 30.0–36.0)
MCHC: 33.5 g/dL (ref 30.0–36.0)
MCV: 87.2 fL (ref 78.0–100.0)
MCV: 88.4 fL (ref 78.0–100.0)
Platelets: 299 10*3/uL (ref 150–400)
Platelets: 241 10*3/uL (ref 150–400)
RBC: 4.53 MIL/uL (ref 3.87–5.11)
RBC: 4.15 MIL/uL (ref 3.87–5.11)
RDW: 13.6 % (ref 11.5–15.5)
RDW: 14 % (ref 11.5–15.5)
WBC: 10.9 10*3/uL — ABNORMAL HIGH (ref 4.0–10.5)
WBC: 9.6 10*3/uL (ref 4.0–10.5)

## 2013-08-11 LAB — BASIC METABOLIC PANEL
BUN: 11 mg/dL (ref 6–23)
CO2: 23 mEq/L (ref 19–32)
Calcium: 9.1 mg/dL (ref 8.4–10.5)
Chloride: 105 mEq/L (ref 96–112)
Creatinine, Ser: 0.65 mg/dL (ref 0.50–1.10)
GFR calc Af Amer: >90 mL/min (ref >90)
GFR calc non Af Amer: >90 mL/min (ref >90)
Glucose, Bld: 141 mg/dL — ABNORMAL HIGH (ref 70–99)
Potassium: 3.9 mEq/L (ref 3.7–5.3)
Sodium: 143 mEq/L (ref 137–147)

## 2013-08-11 LAB — PRO B NATRIURETIC PEPTIDE: Pro B Natriuretic peptide (BNP): 565 pg/mL — ABNORMAL HIGH (ref 0–125)

## 2013-08-11 LAB — POCT I-STAT TROPONIN I: Troponin i, poc: 0.02 ng/mL (ref 0.00–0.08)

## 2013-08-11 LAB — HEMOGLOBIN A1C
Hgb A1c MFr Bld: 6.1 % — ABNORMAL HIGH (ref <5.7)
Mean Plasma Glucose: 128 mg/dL — ABNORMAL HIGH (ref <117)

## 2013-08-11 LAB — TSH: TSH: 1.785 u[IU]/mL (ref 0.350–4.500)

## 2013-08-11 LAB — MAGNESIUM: Magnesium: 1.9 mg/dL (ref 1.5–2.5)

## 2013-08-11 MED ORDER — ONDANSETRON HCL 4 MG/2ML IJ SOLN: 4.0000 mg | Freq: Four times a day (QID) | INTRAMUSCULAR | Status: DC | PRN

## 2013-08-11 MED ORDER — ASPIRIN EC 81 MG PO TBEC
81.0000 mg | DELAYED_RELEASE_TABLET | Freq: Every day | ORAL | Status: DC
  Administered 2013-08-11 – 2013-08-12 (×2): 81 mg via ORAL
  Filled 2013-08-11 (×2): qty 1

## 2013-08-11 MED ORDER — PANTOPRAZOLE SODIUM 40 MG PO TBEC
40.0000 mg | DELAYED_RELEASE_TABLET | Freq: Every day | ORAL | Status: DC
  Administered 2013-08-11 – 2013-08-12 (×2): 40 mg via ORAL
  Filled 2013-08-11 (×2): qty 1

## 2013-08-11 MED ORDER — NITROGLYCERIN 0.4 MG SL SUBL: 0.4000 mg | SUBLINGUAL_TABLET | SUBLINGUAL | Status: DC | PRN

## 2013-08-11 MED ORDER — SODIUM CHLORIDE 0.9 % IJ SOLN: 3.0000 mL | Freq: Two times a day (BID) | INTRAMUSCULAR | Status: DC

## 2013-08-11 MED ORDER — DILTIAZEM HCL 100 MG IV SOLR
5.0000 mg/h | Freq: Once | INTRAVENOUS | Status: AC
  Administered 2013-08-11: 5 mg/h via INTRAVENOUS

## 2013-08-11 MED ORDER — ACETAMINOPHEN 325 MG PO TABS: 650.0000 mg | ORAL_TABLET | ORAL | Status: DC | PRN

## 2013-08-11 MED ORDER — SODIUM CHLORIDE 0.9 % IV SOLN: 250.0000 mL | INTRAVENOUS | Status: DC | PRN

## 2013-08-11 MED ORDER — DILTIAZEM HCL 25 MG/5ML IV SOLN
10.0000 mg | Freq: Once | INTRAVENOUS | Status: AC
  Administered 2013-08-11: 10 mg via INTRAVENOUS

## 2013-08-11 MED ORDER — SODIUM CHLORIDE 0.9 % IJ SOLN: 3.0000 mL | INTRAMUSCULAR | Status: DC | PRN

## 2013-08-11 MED ORDER — ALBUTEROL SULFATE (2.5 MG/3ML) 0.083% IN NEBU: 3.0000 mL | INHALATION_SOLUTION | Freq: Four times a day (QID) | RESPIRATORY_TRACT | Status: DC | PRN

## 2013-08-11 MED ORDER — METOPROLOL SUCCINATE ER 25 MG PO TB24
25.0000 mg | ORAL_TABLET | Freq: Every day | ORAL | Status: DC
  Administered 2013-08-11 – 2013-08-12 (×2): 25 mg via ORAL
  Filled 2013-08-11 (×2): qty 1

## 2013-08-11 MED ORDER — HYDROCHLOROTHIAZIDE 12.5 MG PO CAPS
12.5000 mg | ORAL_CAPSULE | Freq: Every day | ORAL | Status: DC
  Administered 2013-08-11 – 2013-08-12 (×2): 12.5 mg via ORAL
  Filled 2013-08-11 (×2): qty 1

## 2013-08-11 MED ORDER — ENOXAPARIN SODIUM 40 MG/0.4ML ~~LOC~~ SOLN
40.0000 mg | SUBCUTANEOUS | Status: DC
  Filled 2013-08-11 (×2): qty 0.4

## 2013-08-11 NOTE — ED Provider Notes (Signed)
CSN: [id number]     Arrival date & time 08/11/13  0755 History   First MD Initiated Contact with Patient 08/11/13 0848     Chief Complaint  Patient presents with  . Shortness of Breath  . Tachycardia  . Cough     (Consider location/radiation/quality/duration/timing/severity/associated sxs/prior Treatment) HPI Patient presents emergency department with shortness of breath, and a funny feeling in her chest.  The patient, states, that she feels, like her heart is racing.  The symptoms started late last night.  Patient denies nausea, vomiting, diarrhea, abdominal pain, back pain, neck pain, weakness, dizziness, blurred vision, headache, lethargy, or syncope.  Patient, states she did not take any medications prior to arrival.  Patient, states nothing seems to make her condition, better or worse. Past Medical History  Diagnosis Date  . Tachycardia   . Hypertension   . Asthma    Past Surgical History  Procedure Laterality Date  . Abdominal hysterectomy    . Cholecystectomy     Family History  Problem Relation Age of Onset  . Heart disease Mother   . Prostate cancer Father   . Colon polyps Father   . Heart disease Father   . Hyperlipidemia Father   . Hyperlipidemia Mother   . Heart attack Mother   . Heart attack Father    History  Substance Use Topics  . Smoking status: Never Smoker   . Smokeless tobacco: Never Used  . Alcohol Use: No   OB History   Grav Para Term Preterm Abortions TAB SAB Ect Mult Living   3 3 3       2      Review of Systems  All other systems negative except as documented in the HPI. All pertinent positives and negatives as reviewed in the HPI.  Allergies  Review of patient's allergies indicates no known allergies.  Home Medications  No current outpatient prescriptions on file. BP 112/80  Pulse 83  Temp(Src) 98 F (36.7 C) (Oral)  Resp 18  Ht 5\' 4"  (1.626 m)  Wt 160 lb (72.576 kg)  BMI 27.45 kg/m2  SpO2 99% Physical Exam  Nursing note and  vitals reviewed. Constitutional: She is oriented to person, place, and time. She appears well-developed and well-nourished. No distress.  HENT:  Head: Normocephalic and atraumatic.  Mouth/Throat: Oropharynx is clear and moist.  Eyes: Pupils are equal, round, and reactive to light.  Neck: Normal range of motion. Neck supple.  Cardiovascular: Regular rhythm and normal heart sounds.  Tachycardia present.  Exam reveals no gallop and no friction rub.   No murmur heard. Abdominal: Soft. Bowel sounds are normal. She exhibits no distension.  Neurological: She is alert and oriented to person, place, and time. She exhibits normal muscle tone. Coordination normal.  Skin: Skin is warm and dry. No rash noted.    ED Course  Procedures (including critical care time) Labs Review Labs Reviewed  CBC - Abnormal; Notable for the following:    WBC 10.9 (*)    All other components within normal limits  BASIC METABOLIC PANEL - Abnormal; Notable for the following:    Glucose, Bld 141 (*)    All other components within normal limits  PRO B NATRIURETIC PEPTIDE - Abnormal; Notable for the following:    Pro B Natriuretic peptide (BNP) 565.0 (*)    All other components within normal limits  CBC  TSH  HEMOGLOBIN A1C  CREATININE, SERUM  MAGNESIUM  POCT I-STAT TROPONIN I   Imaging Review Dg Chest  2 View  08/11/2013   CLINICAL DATA:  Cough and shortness of Breath.  EXAM: CHEST  2 VIEW  COMPARISON:  02/11/2013.  FINDINGS: The cardiac silhouette, mediastinal and hilar contours are within normal limits and stable. The lungs demonstrate mild bronchitic changes but no infiltrates, edema or effusion. The bony thorax is intact.  IMPRESSION: Mild bronchitic changes but no infiltrates, edema or effusions.   Electronically Signed   By: Kalman Jewels M.D.   On: 08/11/2013 08:31     spoke with cardiology, who will be down to evaluate the patient.  Patient was started on Cardizem drip and given a bolus of  Cardizem.  CRITICAL CARE Performed by: Brent General Total critical care time: 30 minutes Critical care time was exclusive of separately billable procedures and treating other patients. Critical care was necessary to treat or prevent imminent or life-threatening deterioration. Critical care was time spent personally by me on the following activities: development of treatment plan with patient and/or surrogate as well as nursing, discussions with consultants, evaluation of patient's response to treatment, examination of patient, obtaining history from patient or surrogate, ordering and performing treatments and interventions, ordering and review of laboratory studies, ordering and review of radiographic studies, pulse oximetry and re-evaluation of patient's condition.    Brent General, PA-C 08/11/13 [phone]

## 2013-08-11 NOTE — ED Notes (Signed)
Dr. Ellyn Hack ordered to continue cardizem drip at 42mL/hr until pt transferred to floor. Pt HR now in 80's and BP maintaining at systolic of 048.

## 2013-08-11 NOTE — ED Notes (Signed)
Pt sts generalized weakness; pt noted to be diaphoretic and hypotensive; pt alert at present

## 2013-08-11 NOTE — Consult Note (Signed)
ELECTROPHYSIOLOGY CONSULT NOTE   Patient ID: Emily Clarke MRN: [id number], DOB/AGE: 1950-08-16   Admit date: 08/11/2013 Date of Consult: 08/11/2013  Primary Physician: Angelica Chessman, MD Primary Cardiologist: New to [place] Reason for Consultation: Tachycardia  History of Present Illness Emily Clarke is a 63 y.o. female with HTN and asthma who presents to the ED with lightheadedness and persistent cough. She has struggled with chronic cough for the past 2-3 months. She denies sputum production, fever or chills. This AM she reports vigorous coughing that was so frequent and persistent she became lightheaded and was unable to carry on a conversation. She also noticed chest "pressure" and SOB. She noticed racing palpitations once she arrived here. She denies syncope. She denies LE swelling, orthopnea or PND. She has had similar symptoms in the past, only once in 2000, at which time she was told by her PCP that she had "tachycardia" and she started metoprolol then. She cannot recall a specific diagnosis. On arrival here, 12-lead ECG showed SVT at 146 bpm. She was started on IV diltiazem for rate control. Her rate slowed to 110 bpm. She then spontaneously converted to SR. EP has been asked to see and provide recommendations regarding rhythm management.   Past Medical History Past Medical History  Diagnosis Date  . Tachycardia   . Hypertension   . Asthma     Past Surgical History Past Surgical History  Procedure Laterality Date  . Abdominal hysterectomy    . Cholecystectomy      Allergies/Intolerances No Known Allergies  Current Home medications      aspirin EC 81 MG tablet  Take 81 mg by mouth daily.     benzonatate 100 MG capsule  Commonly known as:  TESSALON  Take 1 capsule (100 mg total) by mouth 3 (three) times daily as needed for cough.     fluticasone 50 MCG/ACT nasal spray  Commonly known as:  FLONASE  Place 2 sprays into both nostrils daily.     hydrochlorothiazide 12.5 MG tablet  Commonly known as:  HYDRODIURIL  Take 1 tablet (12.5 mg total) by mouth daily.     metoprolol succinate 25 MG 24 hr tablet  Commonly known as:  TOPROL-XL  Take 1 tablet (25 mg total) by mouth daily.     Inpatient Medications . aspirin EC  81 mg Oral Daily  . enoxaparin (LOVENOX) injection  40 mg Subcutaneous Q24H  . hydrochlorothiazide  12.5 mg Oral Daily  . metoprolol succinate  25 mg Oral Daily  . pantoprazole  40 mg Oral Daily  . sodium chloride  3 mL Intravenous Q12H    Family History Family History  Problem Relation Age of Onset  . Heart disease Mother   . Prostate cancer Father   . Colon polyps Father   . Heart disease Father   . Hyperlipidemia Father   . Hyperlipidemia Mother   . Heart attack Mother   . Heart attack Father      Social History History   Social History  . Marital Status: Divorced    Spouse Name: N/A    Number of Children: 3  . Years of Education: N/A   Occupational History  .     Social History Main Topics  . Smoking status: Never Smoker   . Smokeless tobacco: Never Used  . Alcohol Use: No  . Drug Use: No  . Sexual Activity: Yes    Birth Control/ Protection: Surgical   Other Topics Concern  .  Not on file   Social History Narrative  . No narrative on file     Review of Systems General: No chills, fever, night sweats or weight changes  Cardiovascular:  No chest pain, dyspnea on exertion, edema, orthopnea, palpitations, paroxysmal nocturnal dyspnea Dermatological: No rash, lesions or masses Respiratory: No cough, dyspnea Urologic: No hematuria, dysuria Abdominal: No nausea, vomiting, diarrhea, bright red blood per rectum, melena, or hematemesis Neurologic: No visual changes, weakness, changes in mental status All other systems reviewed and are otherwise negative except as noted above.  Physical Exam Vitals: Blood pressure 112/80, pulse 83, temperature 98 F (36.7 C), temperature source Oral,  resp. rate 18, height 5\' 4"  (1.626 m), weight 160 lb (72.576 kg), SpO2 99.00%.  General: Well developed, well appearing 63 y.o. female in no acute distress. HEENT: Normocephalic, atraumatic. EOMs intact. Sclera nonicteric. Oropharynx clear.  Neck: Supple. No JVD. Lungs: Respirations regular and unlabored, CTA bilaterally. No wheezes, rales or rhonchi. Heart: RRR. S1, S2 present. No murmurs, rub, S3 or S4. Abdomen: Soft, non-tender, non-distended. BS present x 4 quadrants. No hepatosplenomegaly.  Extremities: No clubbing, cyanosis or edema. DP/PT/Radials 2+ and equal bilaterally. Psych: Normal affect. Neuro: Alert and oriented X 3. Moves all extremities spontaneously. Musculoskeletal: No kyphosis. Skin: Intact. Warm and dry. No rashes or petechiae in exposed areas.   Labs Lab Results  Component Value Date   WBC 10.9* 08/11/2013   HGB 13.5 08/11/2013   HCT 39.5 08/11/2013   MCV 87.2 08/11/2013   PLT 299 08/11/2013    Recent Labs Lab 08/11/13 0803  NA 143  K 3.9  CL 105  CO2 23  BUN 11  CREATININE 0.65  CALCIUM 9.1  GLUCOSE 141*    Radiology/Studies Dg Chest 2 View  08/11/2013   CLINICAL DATA:  Cough and shortness of Breath.  EXAM: CHEST  2 VIEW  COMPARISON:  02/11/2013.  FINDINGS: The cardiac silhouette, mediastinal and hilar contours are within normal limits and stable. The lungs demonstrate mild bronchitic changes but no infiltrates, edema or effusion. The bony thorax is intact.  IMPRESSION: Mild bronchitic changes but no infiltrates, edema or effusions.   Electronically Signed   By: Kalman Jewels M.D.   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 Chronic/persistent cough and asthma 3. HTN  Signed, Jacqualine Mau 08/11/2013, 1:54 PM  Electrophysiology attending  Patient seen and examined, agree with the history, physical exam, and data as noted above by [employer], PA-C. The patient has symptomatic SVT which has been infrequent over the years. I would recommend medical therapy initially with a beta blocker. A calcium channel blocker could be considered if she is intolerant of a beta blocker because of reactive airway disease. Catheter ablation would be an option although I do not think she is yet symptomatic enough to recommend this procedure at this time. She will undergo watchful waiting. If she rules out for MI, early discharge would be appropriate.  Cristopher Peru, M.D.

## 2013-08-11 NOTE — ED Notes (Signed)
Patient transported to X-ray 

## 2013-08-11 NOTE — H&P (Signed)
Patient ID: DAVEAH VARONE MRN: [id number], DOB/AGE: [birth year]   Admit date: 08/11/2013   Primary Physician: Angelica Chessman, MD Primary Cardiologist: New  Pt. Profile:  Emily Clarke is a 63 y.o. female with a history of tachycardia? (2000- tx'd with Toprol), hypertension, pre-diabetes, scoliosis and no prior cardiac history who presented to the [facility] ED this morning for chest discomfort and SOB. She has had a persistant dry cough for 3 months and has been evaluated multiple times by different providers. She has been diagnosed with asthma, bronchitis, cough/cold and prescribed antibiotics, cough suppressants and nasal spray with no relief. She has no other nasal symtoms and no fevers or chills. She does have hot flashes. It is a dry cough but does produce some scant yellow-brown sputum. No hemoptysis. At 2pm yesterday she started to notice slight "discomfort/congestion" in her chest. This chest discomfort persisted and this morning she had a fit of coughing which made her feel SOB, lightheaded and dizzy. She also reported some right neck pain but is unable to say if it is related to the chest discomfort. She stated that she just "did not feel right," which prompted her to come to the ED. No n/v, abdominal pain, palpitations, orthopnea, PND, lower extremity swelling. She reports that in 2000 she had an episode of palpitation for which she was put on Toprol XL. She has had no problems since. (No records in system). She does not smoke, drink or use illicit drugs.   EKG reveals a junctional tachycardia. Troponin negative. WBC 10.9, BNP 565. All other lab within normal limits.   Problem List  Past Medical History  Diagnosis Date  . Tachycardia   . Hypertension   . Asthma     Past Surgical History  Procedure Laterality Date  . Abdominal hysterectomy    . Cholecystectomy       Allergies  No Known Allergies   Home Medications  Prior to Admission medications   Medication Sig  Start Date End Date Taking? Authorizing Provider  aspirin EC 81 MG tablet Take 81 mg by mouth daily.   Yes Historical Provider, MD  benzonatate (TESSALON) 100 MG capsule Take 1 capsule (100 mg total) by mouth 3 (three) times daily as needed for cough. 08/01/13  Yes Deepak Advani, MD  fluticasone (FLONASE) 50 MCG/ACT nasal spray Place 2 sprays into both nostrils daily. 08/01/13  Yes Lorayne Marek, MD  hydrochlorothiazide (HYDRODIURIL) 12.5 MG tablet Take 1 tablet (12.5 mg total) by mouth daily. 08/01/13  Yes Lorayne Marek, MD  metoprolol succinate (TOPROL-XL) 25 MG 24 hr tablet Take 1 tablet (25 mg total) by mouth daily. 08/01/13  Yes Lorayne Marek, MD    Family History  Family History  Problem Relation Age of Onset  . Heart disease Mother   . Prostate cancer Father   . Colon polyps Father   . Heart disease Father   . Hyperlipidemia Father   . Hyperlipidemia Mother   . Heart attack Mother   . Heart attack Father     Social History  History   Social History  . Marital Status: Divorced    Spouse Name: N/A    Number of Children: 3  . Years of Education: N/A   Occupational History  .     Social History Main Topics  . Smoking status: Never Smoker   . Smokeless tobacco: Never Used  . Alcohol Use: No  . Drug Use: No  . Sexual Activity: Yes    Birth  Control/ Protection: Surgical   Other Topics Concern  . Not on file   Social History Narrative  . No narrative on file     All other systems reviewed and are otherwise negative except as noted above.  Physical Exam  Blood pressure 109/74, pulse 141, temperature 98.5 F (36.9 C), temperature source Oral, resp. rate 21, SpO2 91.00%.  General: Pleasant, NAD Psych: Normal affect. Neuro: Alert and oriented X 3. Moves all extremities spontaneously. HEENT: Normal  Neck: Supple without bruits or JVD. Lungs:  Resp regular and unlabored, CTA. Heart: RRR no s3, s4, or murmurs. Tachycardic  Abdomen: Soft, non-tender, non-distended,  BS + x 4.  Extremities: No clubbing, cyanosis or edema. DP/PT/Radials 2+ and equal bilaterally.  Labs  No results found for this basename: CKTOTAL, CKMB, TROPONINI,  in the last 72 hours Lab Results  Component Value Date   WBC 10.9* 08/11/2013   HGB 13.5 08/11/2013   HCT 39.5 08/11/2013   MCV 87.2 08/11/2013   PLT 299 08/11/2013     Recent Labs Lab 08/11/13 0803  NA 143  K 3.9  CL 105  CO2 23  BUN 11  CREATININE 0.65  CALCIUM 9.1  GLUCOSE 141*     Radiology/Studies  Dg Chest 2 View  08/11/2013   CLINICAL DATA:  Cough and shortness of Breath.  EXAM: CHEST  2 VIEW  COMPARISON:  02/11/2013.  FINDINGS: The cardiac silhouette, mediastinal and hilar contours are within normal limits and stable. The lungs demonstrate mild bronchitic changes but no infiltrates, edema or effusion. The bony thorax is intact.  IMPRESSION: Mild bronchitic changes but no infiltrates, edema or effusions.    ECG  Junctional tachycardia HR 146     ASSESSMENT AND PLAN CHASMINE LENDER is a 63 y.o. female with a history of tachycardia? (2000- tx'd with Toprol), hypertension, pre-diabetes, scoliosis, chronic cough x3 months and no prior cardiac history who presented to the [facility] ED this morning for chest discomfort and coughing "fit". ECG in ED reveals a junctional tachycardia HR 146.  Troponin negative x1. WBC 10.9, BNP 565. All other lab within normal limits  Junctional tachycardia? -- Will consult EP.  Has a history of tachydysrhythmia ~15 years ago that has been quiescent on Toprol XL. No records -- Tachycardia slowed after cardizem bolus and gtt. She spontaneously converted into NSR. Continue to monitor. -- Will order a TSH -- Will order an ECHO  COUGH- this has been a persistent cough for x3 months. CXR reveals mild bronchitic changes but no infiltrates, edema or effusions.  She is a NEVER smoker -- Consider Xopenex in the setting of tachycardia -- Continue diltiazem gtt -- Cough worse after eating and when  laying down, will start empiric PPI and need a GI eval as outpatient.   Chest pain- atypical. It sounds more related to cough and tachycardia; however, she has some risk factors for CAD including a family history of heart disease, hypertension, age -- Serial cardiac enzymes and ECGs  HTN- continue HCTZ  Pre-diabetes- will order a HgA1c   DISPO- anticipated d/c tomorrow. Will need outpatient GI consult for GERD induced cough  Signed, Perry Mount, PA-C 08/11/2013, 11:42 AM  Pager [phone]

## 2013-08-11 NOTE — ED Notes (Signed)
Ordered pt a lunch tray 

## 2013-08-11 NOTE — H&P (Signed)
I seen and evaluated the patient along with Ms. Perry Mount, PA this morning in the emergency room. I agree with her findings, examination recommendations as discussed together.  It seemed like the patient has a chronic cough that is most likely related to either reactive airways disease or chronic GERD. She does note that she gets coughing episodes after eating and lying down at night. She also has a history of "tachycardia ".  She presented this morning after a pretty significant bouts of coughing that was followed by just generally feeling poorly both weak and fatigued with a sensation of a rapid heartbeat. She was noted to be in what looked to be a junctional tachycardia at about 140s beats minute. After diltiazem her rate dropped into the 110s, was still junctional. She currently feels a little better and upon my initial evaluation was back in sinus rhythm.  She was noted to have some chest discomfort, which is now improved with rate control. She is with some mild musculoskeletal the tenderness along the upper rib cage from all her coughing.  Plan is to admit for observation to rule out MI, check echocardiogram and will consult with physiology for potential treatment options for junctional tachycardia/SVT. We'll continue the Toprol for now.  She'll probably need a GI evaluation as an outpatient but will start a PPI empirically. We'll also will start on Xopenex to avoid albuterol the setting of tachycardia.  She's had some wheezing.  Would anticipate discharge tomorrow, and plan for outpatient GI consult.  Leonie Man, M.D., M.S. [facility] [address] [place], [place]  [address]  [phone] Pager # [phone] 08/11/2013

## 2013-08-11 NOTE — Discharge Planning (Signed)
[facility] Emily Clarke, Community Liaison Ext [phone]  Emily Clarke is an established patient at the [facility] and has a current orange card with the practice. Follow up ED/hospital  appointment made for Wed March 4,2015 at 9:00 am. Patient is aware of this appointment and was given my contact information for any questions or concerns. Case management referral was also done through her orange card.

## 2013-08-11 NOTE — ED Notes (Signed)
PA for cardiology at bedside. Advised to leave cardizem drip at 61mL/hr for now since systolic BP 557.

## 2013-08-11 NOTE — ED Provider Notes (Signed)
Medical screening examination/treatment/procedure(s) were conducted as a shared visit with non-physician practitioner(s) and myself.  I personally evaluated the patient during the encounter.   The patient will be  admitted for what appears to initially be atrial flutter now responding to Cardizem.  Questionable a flutter versus  A.fib.   Hoy Morn, MD 08/11/13 [phone]

## 2013-08-11 NOTE — ED Notes (Signed)
Pt c/o SOB and cough; pt sts "feels like something is in chest"; pt noted to be tachycardic; pt sts hx of same; pt sts pain in back of head

## 2013-08-12 DIAGNOSIS — R0989 Other specified symptoms and signs involving the circulatory and respiratory systems: Secondary | ICD-10-CM

## 2013-08-12 DIAGNOSIS — R0609 Other forms of dyspnea: Secondary | ICD-10-CM

## 2013-08-12 DIAGNOSIS — I471 Supraventricular tachycardia: Secondary | ICD-10-CM

## 2013-08-12 DIAGNOSIS — I498 Other specified cardiac arrhythmias: Secondary | ICD-10-CM

## 2013-08-12 DIAGNOSIS — R079 Chest pain, unspecified: Secondary | ICD-10-CM

## 2013-08-12 LAB — CBC
HCT: 37.9 % (ref 36.0–46.0)
Hemoglobin: 12.7 g/dL (ref 12.0–15.0)
MCH: 29.5 pg (ref 26.0–34.0)
MCHC: 33.5 g/dL (ref 30.0–36.0)
MCV: 87.9 fL (ref 78.0–100.0)
Platelets: 263 10*3/uL (ref 150–400)
RBC: 4.31 MIL/uL (ref 3.87–5.11)
RDW: 13.7 % (ref 11.5–15.5)
WBC: 9.2 10*3/uL (ref 4.0–10.5)

## 2013-08-12 LAB — BASIC METABOLIC PANEL
BUN: 15 mg/dL (ref 6–23)
CO2: 24 mEq/L (ref 19–32)
Calcium: 9.3 mg/dL (ref 8.4–10.5)
Chloride: 106 mEq/L (ref 96–112)
Creatinine, Ser: 0.73 mg/dL (ref 0.50–1.10)
GFR calc Af Amer: >90 mL/min (ref >90)
GFR calc non Af Amer: >90 mL/min (ref >90)
Glucose, Bld: 101 mg/dL — ABNORMAL HIGH (ref 70–99)
Potassium: 4 mEq/L (ref 3.7–5.3)
Sodium: 146 mEq/L (ref 137–147)

## 2013-08-12 LAB — TROPONIN I: Troponin I: <0.3 ng/mL (ref <0.30)

## 2013-08-12 LAB — LIPID PANEL
Cholesterol: 191 mg/dL (ref 0–200)
HDL: 39 mg/dL — ABNORMAL LOW (ref >39)
LDL Cholesterol: 120 mg/dL — ABNORMAL HIGH (ref 0–99)
Total CHOL/HDL Ratio: 4.9 RATIO
Triglycerides: 162 mg/dL — ABNORMAL HIGH (ref <150)
VLDL: 32 mg/dL (ref 0–40)

## 2013-08-12 MED ORDER — LEVALBUTEROL TARTRATE 45 MCG/ACT IN AERO: 1.0000 | INHALATION_SPRAY | Freq: Four times a day (QID) | RESPIRATORY_TRACT | Status: DC | PRN

## 2013-08-12 MED ORDER — PANTOPRAZOLE SODIUM 40 MG PO TBEC: 40.0000 mg | DELAYED_RELEASE_TABLET | Freq: Every day | ORAL | Status: DC

## 2013-08-12 MED ORDER — ALBUTEROL SULFATE (2.5 MG/3ML) 0.083% IN NEBU: 2.5000 mg | INHALATION_SOLUTION | Freq: Four times a day (QID) | RESPIRATORY_TRACT | Status: DC | PRN

## 2013-08-12 MED ORDER — LEVALBUTEROL HCL 0.63 MG/3ML IN NEBU: 0.6300 mg | INHALATION_SOLUTION | Freq: Four times a day (QID) | RESPIRATORY_TRACT | Status: DC | PRN

## 2013-08-12 MED ORDER — LEVALBUTEROL TARTRATE 45 MCG/ACT IN AERO: 1.0000 | INHALATION_SPRAY | RESPIRATORY_TRACT | Status: DC | PRN

## 2013-08-12 NOTE — Progress Notes (Signed)
Subjective: Denies chest pain, palpitations and SOB. She continues to have a cough. She feels dizzy when first standing, but it resolves after several seconds. She denies syncope/ near syncope.  Complains of R neck pain  Worse with movement.    Objective: Vital signs in last 24 hours: Temp:  [97.8 F (36.6 C)-98.4 F (36.9 C)] 97.8 F (36.6 C) (02/10 0500) Pulse Rate:  [74-147] 74 (02/10 0500) Resp:  [13-21] 18 (02/10 0500) BP: (86-117)/(59-83) 98/59 mmHg (02/10 0500) SpO2:  [90 %-100 %] 94 % (02/10 0500) Weight:  [160 lb (72.576 kg)-163 lb 3.2 oz (74.027 kg)] 163 lb 3.2 oz (74.027 kg) (02/10 0500) Last BM Date: 08/11/13  Intake/Output from previous day:   Intake/Output this shift:    Medications Current Facility-Administered Medications  Medication Dose Route Frequency Provider Last Rate Last Dose  . 0.9 %  sodium chloride infusion  250 mL Intravenous PRN Perry Mount, PA-C      . acetaminophen (TYLENOL) tablet 650 mg  650 mg Oral Q4H PRN Perry Mount, PA-C      . albuterol (PROVENTIL) (2.5 MG/3ML) 0.083% nebulizer solution 3 mL  3 mL Inhalation Q6H PRN Perry Mount, PA-C      . aspirin EC tablet 81 mg  81 mg Oral Daily Perry Mount, PA-C   81 mg at 08/11/13 1357  . enoxaparin (LOVENOX) injection 40 mg  40 mg Subcutaneous Q24H Perry Mount, PA-C      . hydrochlorothiazide (MICROZIDE) capsule 12.5 mg  12.5 mg Oral Daily Perry Mount, PA-C   12.5 mg at 08/11/13 1357  . metoprolol succinate (TOPROL-XL) 24 hr tablet 25 mg  25 mg Oral Daily Perry Mount, PA-C   25 mg at 08/11/13 1357  . nitroGLYCERIN (NITROSTAT) SL tablet 0.4 mg  0.4 mg Sublingual Q5 Min x 3 PRN Perry Mount, PA-C      . ondansetron [facility]) injection 4 mg  4 mg Intravenous Q6H PRN Perry Mount, PA-C      . pantoprazole (PROTONIX) EC tablet 40 mg  40 mg Oral Daily Perry Mount, PA-C   40 mg at 08/11/13 1357  . sodium chloride 0.9 % injection 3 mL  3 mL Intravenous Q12H Perry Mount, PA-C      . sodium  chloride 0.9 % injection 3 mL  3 mL Intravenous PRN Perry Mount, PA-C        PE: General appearance: alert, cooperative and no distress Lungs: clear to auscultation bilaterally Heart: regular rate and rhythm Extremities: no LEE Pulses: 2+ and symmetric Skin: warm and dry Neurologic: Grossly normal  Tele: SR  Lab Results:   Recent Labs  08/11/13 0803 08/11/13 1455 08/12/13 0528  WBC 10.9* 9.6 9.2  HGB 13.5 12.3 12.7  HCT 39.5 36.7 37.9  PLT 299 241 263   BMET  Recent Labs  08/11/13 0803 08/11/13 1455 08/12/13 0528  NA 143  --  146  K 3.9  --  4.0  CL 105  --  106  CO2 23  --  24  GLUCOSE 141*  --  101*  BUN 11  --  15  CREATININE 0.65 0.78 0.73  CALCIUM 9.1  --  9.3   PT/INR No results found for this basename: LABPROT, INR,  in the last 72 hours Cholesterol  Recent Labs  08/12/13 0528  CHOL 191   Cardiac Enzymes No components found with this basename: TROPONIN,  CKMB,   Studies/Results:  2D echo - results pending   Assessment/Plan  Active  Problems:   Chest pain   1.  CP  Resolved. Prob related to SVT  2  SVT.  Keep on toprol  See below.  F/U with EP in 2 to 3 wks.   With toprol I would d/c HCTZ.    Plan: Pt was admitted for SVT and for MI r/o but troponins were never cycled. POC troponin in the ER was negative. 2D echo still pending. Telemetry shows borderline sinus tach with HR in the 90s to low 100s. BP is soft but stable. She denies CP,SOB and palpitations, but she feels dizzy when first standing. This resolves after several seconds. No syncope/ near syncope. Electrophysiology was consulted yesterday for recs for the patient's SVT. She was seen by Dr. Lovena Le. He is recommending medical therapy initially with a BB. A CCB could also be considered if she is intolerant of a BB because of reactive airway disease. Catheter ablation would be an option, although Dr. Lovena Le dose not think she is symptomatic enough to recommend it at this time. Recs are  for watchful waiting. If symptomatic SVT recurs, will refer her back to EP as an outpatient. We will continue her on 25 mg of Toprol-XL. ? If we should discontinue her HCTZ to prevent hypotension. She will need close post-hospital f/u to reassess HR, BP and tolerance to BB. Will d/c PRN albuterol and order Xopenex for bronchospasm, to reduce amount of reflex tachycardia. Plan for likely discharge home today after 2D echo. MD to follow.      LOS: 1 day    Brittainy M. Rosita Fire, PA-C 08/12/2013 8:20 AM  Patinet seen and examined.  Agree with findings of B Simmons.  I have added a few comments above.    Dorris Carnes

## 2013-08-12 NOTE — Discharge Summary (Signed)
Physician Discharge Summary  Patient ID: Emily Clarke MRN: [id number] DOB/AGE: 10/25/1950 63 y.o.  Admit date: 08/11/2013 Discharge date: 08/12/2013  Admission Diagnoses: SVT  Discharge Diagnoses:  Active Problems:   Chest pain   SVT (supraventricular tachycardia)   Discharged Condition: stable  Hospital Course: Emily Clarke is a 63 y.o. female with a history of tachycardia, hypertension, and pre-diabetes. She reports that in 2000 she had an episode of palpitations for which she was put on Toprol XL. She has had no problems since then.   She presented to [facility] on 08/11/13 with a complaint of chest discomfort and SOB. She had also endorsed a persistant dry cough for 3 months and had been evaluated multiple times by different providers. She has been diagnosed with asthma, bronchitis, cough/cold and prescribed antibiotics, cough suppressants and nasal spray with no relief. She denied other nasal symtoms and no fevers or chills.  Her cough was decribed mostly as a dry cough but does produce some scant yellow-brown sputum. She denied hemoptysis. Prior to arrival to the ER, she started to notice slight "discomfort/congestion" in her chest. This chest discomfort persisted and she had a fit of coughing which made her feel SOB, lightheaded and dizzy, prompting her to seek urgent medical evaluation.   When she arrived to the ER, an EKG demonstrated junctional tachycardia at about 140 bpm. Troponin was negative. She was placed on IV Diltiazem and she converted back to NSR fairly quickly. She was admitted however for further monitoring. She was resumed on 25 mg of Toprol-XL. She was started on a PPI, as it was felt that perhaps her chronic cough was subsequent to GERD. She had a 2D echo which revealed normal systolic function with an EF of 55-60% and no WMA. No defect or patent foramen ovale was identified. She had no further arrythmia noted on telemetry and she remained in NSR with HR in the 90s-low 100s.  However, Electrophysiology was consulted for recs for her SVT. She was seen by Dr. Lovena Le. He recommended continued medical therapy, initially, with a BB. He felt that a CCB could also be considered if she became intolerant of Toprol because of reactive airway disease. Catheter ablation would be an option, although Dr. Lovena Le does not think she is symptomatic enough to recommend it at this time. He suggested continued watchful waiting. It was also decided to change her PRN albuterol to Xopenex to limit reflex tachycardia. Her HCTZ was also discontinued to reduce the chance for hypotension, as her BPs were a bit soft in combination with her BB. She was last seen and examined by Dr. Harrington Challenger who determined she was stable for discharge home. She will follow-up in clinic with EP in 2 weeks.    Consults: Electrophysiology  Significant Diagnostic Studies:   2D echo 08/11/13 Study Conclusions  - Left ventricle: The cavity size was normal. Systolic function was normal. The estimated ejection fraction was in the range of 55% to 60%. Wall motion was normal; there were no regional wall motion abnormalities. - Atrial septum: No defect or patent foramen ovale was identified.   Treatments: See Hospital Course  Discharge Exam: Blood pressure 98/59, pulse 74, temperature 97.8 F (36.6 C), temperature source Oral, resp. rate 18, height 5\' 4"  (1.626 m), weight 163 lb 3.2 oz (74.027 kg), SpO2 94.00%.   Disposition: 01-Home or Self Care      Discharge Orders   Future Appointments Provider Department Dept Phone   08/27/2013 2:30 PM Andrez Grime, [place]  [place] Office [phone]   09/03/2013 9:00 AM Lorayne Marek, MD [place] [phone]   Future Orders Complete By Expires   Diet - low sodium heart healthy  As directed    Increase activity slowly  As directed        Medication List    STOP taking these medications       hydrochlorothiazide 12.5 MG  tablet  Commonly known as:  HYDRODIURIL      TAKE these medications       aspirin EC 81 MG tablet  Take 81 mg by mouth daily.     benzonatate 100 MG capsule  Commonly known as:  TESSALON  Take 1 capsule (100 mg total) by mouth 3 (three) times daily as needed for cough.     fluticasone 50 MCG/ACT nasal spray  Commonly known as:  FLONASE  Place 2 sprays into both nostrils daily.     levalbuterol 45 MCG/ACT inhaler  Commonly known as:  XOPENEX HFA  Inhale 1-2 puffs into the lungs every 4 (four) hours as needed for wheezing.     metoprolol succinate 25 MG 24 hr tablet  Commonly known as:  TOPROL-XL  Take 1 tablet (25 mg total) by mouth daily.     pantoprazole 40 MG tablet  Commonly known as:  PROTONIX  Take 1 tablet (40 mg total) by mouth daily.       Follow-up Information   Follow up with Ileene Hutchinson, PA-C On 08/27/2013. (2:30 pm )    Specialty:  Cardiology   Contact information:   [address] [place] [address] [phone]      TIME SPENT ON DISCHARGE, INCLUDING PHYSICIAN TIME: > 30 MINUTES Signed: Sidnie Swalley 08/12/2013, 2:11 PM

## 2013-08-12 NOTE — Discharge Summary (Signed)
Agree with plans as noted. 

## 2013-08-12 NOTE — Progress Notes (Signed)
  Echocardiogram 2D Echocardiogram has been performed.  Emily Clarke 08/12/2013, 9:20 AM

## 2013-08-12 NOTE — Discharge Instructions (Signed)
Cardiac Arrhythmia  Your heart is a muscle that works to pump blood through your body by regular contractions. The beating of your heart is controlled by a system of special pacemaker cells. These cells control the electrical activity of the heart. When the system controlling this regular beating is disturbed, a heart rhythm abnormality (arrhythmia) results.  WHEN YOUR HEART SKIPS A BEAT  One of the most common and least serious heart arrhythmias is called an ectopic or premature atrial heartbeat (PAC). This may be noticed as a small change in your regular pulse. A PAC originates from the top part (atrium) of the heart. Within the right atrium, the SA node is the area that normally controls the regularity of the heart. PACs occur in heart tissue outside of the SA node region. You may feel this as a skipped beat or heart flutter, especially if several occur in succession or occur frequently.   Another arrhythmia is ventricular premature complex (VCP or PVC). These extra beats start out in the bottom, more muscular chambers of the heart. In most cases a PVC is harmless. If there are underlying causes that are making the heart irritable such as an overactive thyroid or a prior heart attack PVCs may be of more concern. In a few cases, medications to control the heart rhythm may be prescribed.  Things to try at home:   Cut down or avoid alcohol, tobacco and caffeine.   Get enough sleep.   Reduce stress.   Exercise more.  WHEN THE HEART BEATS TOO FAST  Atrial tachycardia is a fast heart rate, which starts out in the atrium. It may last from minutes to much longer. Your heart may beat 140 to 240 times per minute instead of the normal 60 to 100.   Symptoms include a worried feeling (anxiety) and a sense that your heart is beating fast and hard.   You may be able to stop the fast rate by holding your breath or bearing down as if you were going to have a bowel movement.   This type of fast rate is usually not  dangerous.  Atrial fibrillation and atrial flutter are other fast rhythms that start in the atria. Both conditions keep the atria from filling with enough blood so the heart does not work well.   Symptoms include feeling light-headed or faint.   These fast rates may be the result of heart damage or disease. Too much thyroid hormone may play a role.   There may be no clear cause or it may be from heart disease or damage.   Medication or a special electrical treatment (cardioversion) may be needed to get the heart beating normally.  Ventricular tachycardia is a fast heart rate that starts in the lower muscular chambers (ventricles) This is a serious disorder that requires treatment as soon as possible. You need someone else to get and use a small defibrillator.   Symptoms include collapse, chest pain, or being short of breath.   Treatment may include medication, procedures to improve blood flow to the heart, or an implantable cardiac defibrillator (ICD).  DIAGNOSIS    A cardiogram (EKG or ECG) will be done to see the arrhythmia, as well as lab tests to check the underlying cause.   If the extra beats or fast rate come and go, you may wear a Holter monitor that records your heart rate for a longer period of time.  SEEK MEDICAL CARE IF:   You have irregular or fast heartbeats (palpitations).     You experience skipped beats.   You develop lightheadedness.   You have chest discomfort.   You have shortness of breath.   You have more frequent episodes, if you are already being treated.  SEEK IMMEDIATE MEDICAL CARE IF:    You have severe chest pain, especially if the pain is crushing or pressure-like and spreads to the arms, back, neck, or jaw, or if you have sweating, feeling sick to your stomach (nausea), or shortness of breath. THIS IS AN EMERGENCY. Do not wait to see if the pain will go away. Get medical help at once. Call 911 or 0 (operator). DO NOT drive yourself to the hospital.   You feel dizzy or  faint.   You have episodes of previously documented atrial tachycardia that do not resolve with the techniques your caregiver has taught you.   Irregular or rapid heartbeats begin to occur more often than in the past, especially if they are associated with more pronounced symptoms or of longer duration.  Document Released: 06/19/2005 Document Revised: 09/11/2011 Document Reviewed: 02/05/2008  [employer] Patient Information 2014 [employer].

## 2013-08-27 ENCOUNTER — Encounter: Payer: Self-pay | Admitting: Cardiology

## 2013-08-27 ENCOUNTER — Ambulatory Visit (INDEPENDENT_AMBULATORY_CARE_PROVIDER_SITE_OTHER): Payer: No Typology Code available for payment source | Admitting: Cardiology

## 2013-08-27 VITALS — BP 134/78 | HR 84 | Ht 64.0 in | Wt 159.0 lb

## 2013-08-27 DIAGNOSIS — I471 Supraventricular tachycardia: Secondary | ICD-10-CM

## 2013-08-27 DIAGNOSIS — I498 Other specified cardiac arrhythmias: Secondary | ICD-10-CM

## 2013-08-27 NOTE — Patient Instructions (Signed)
Your physician recommends that you schedule a follow-up appointment in: 2 MONTHS WITH DR. KLEIN  Your physician recommends that you continue on your current medications as directed. Please refer to the Current Medication list given to you today.   

## 2013-08-27 NOTE — Progress Notes (Signed)
Patient ID: Emily Clarke MRN: [id number], DOB/AGE: 1951-06-08   Date of Visit: 08/27/2013  Primary Physician: Angelica Chessman, MD Primary EP: New to [place]; Lovena Le, MD Reason for Visit: Hospital follow-up  History of Present Illness  Emily Clarke is a 63 y.o. female with HTN, asthma and PSVT who presents today for hospital electrophysiology followup. Since discharge, she reports she is doing well and has no complaints. She denies chest pain or shortness of breath. She denies palpitations, dizziness, near syncope or syncope. She denies LE swelling, orthopnea or PND. She is compliant with metoprolol.  Past Medical History Past Medical History  Diagnosis Date  . Tachycardia   . Hypertension   . Asthma     Past Surgical History Past Surgical History  Procedure Laterality Date  . Abdominal hysterectomy    . Cholecystectomy      Allergies/Intolerances No Known Allergies  Current Home Medications Current Outpatient Prescriptions  Medication Sig Dispense Refill  . aspirin EC 81 MG tablet Take 81 mg by mouth daily.      . benzonatate (TESSALON) 100 MG capsule Take 1 capsule (100 mg total) by mouth 3 (three) times daily as needed for cough.  30 capsule  1  . fluticasone (FLONASE) 50 MCG/ACT nasal spray Place 2 sprays into both nostrils daily.  16 g  6  . levalbuterol (XOPENEX HFA) 45 MCG/ACT inhaler Inhale 1-2 puffs into the lungs every 4 (four) hours as needed for wheezing.  1 Inhaler  12  . metoprolol succinate (TOPROL-XL) 25 MG 24 hr tablet Take 1 tablet (25 mg total) by mouth daily.  30 tablet  3  . pantoprazole (PROTONIX) 40 MG tablet Take 1 tablet (40 mg total) by mouth daily.  30 tablet  5   No current facility-administered medications for this visit.    Social History History   Social History  . Marital Status: Divorced    Spouse Name: N/A    Number of Children: 3  . Years of Education: N/A   Occupational History  .     Social History Main Topics    . Smoking status: Never Smoker   . Smokeless tobacco: Never Used  . Alcohol Use: No  . Drug Use: No  . Sexual Activity: Yes    Birth Control/ Protection: Surgical   Other Topics Concern  . Not on file   Social History Narrative  . No narrative on file     Review of Systems General: No chills, fever, night sweats or weight changes Cardiovascular: No chest pain, dyspnea on exertion, edema, orthopnea, palpitations, paroxysmal nocturnal dyspnea Dermatological: No rash, lesions or masses Respiratory: No cough, dyspnea Urologic: No hematuria, dysuria Abdominal: No nausea, vomiting, diarrhea, bright red blood per rectum, melena, or hematemesis Neurologic: No visual changes, weakness, changes in mental status All other systems reviewed and are otherwise negative except as noted above.  Physical Exam Vitals: Blood pressure 134/78, pulse 84, height 5\' 4"  (1.626 m), weight 159 lb (72.122 kg).  General: Well developed, well appearing 63 y.o. female in no acute distress. HEENT: Normocephalic, atraumatic. EOMs intact. Sclera nonicteric. Oropharynx clear.  Neck: Supple. No JVD. Lungs: Respirations regular and unlabored, CTA bilaterally. No wheezes, rales or rhonchi. Heart: RRR. S1, S2 present. No murmurs, rub, S3 or S4. Abdomen: Soft, non-distended.  Extremities: No clubbing, cyanosis or edema. PT/Radials 2+ and equal bilaterally. Psych: Normal affect. Neuro: Alert and oriented X 3. Moves all extremities spontaneously.   Diagnostics 12-lead ECG today - NSR  at 78 bpm with normal intervals and no evidence of pre-excitation; PR 188, QRS 82, QTc 435  Assessment and Plan 1. 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 Lovena Le in 2-3 months  Signed, Ileene Hutchinson, PA-C 08/27/2013, 3:32 PM

## 2013-09-02 ENCOUNTER — Other Ambulatory Visit: Payer: Self-pay

## 2013-09-02 DIAGNOSIS — J42 Unspecified chronic bronchitis: Secondary | ICD-10-CM

## 2013-09-03 ENCOUNTER — Encounter: Payer: Self-pay | Admitting: Internal Medicine

## 2013-09-03 ENCOUNTER — Ambulatory Visit: Payer: No Typology Code available for payment source | Attending: Internal Medicine | Admitting: Internal Medicine

## 2013-09-03 VITALS — BP 129/88 | HR 83 | Temp 98.1°F | Resp 16 | Wt 160.4 lb

## 2013-09-03 DIAGNOSIS — R0602 Shortness of breath: Secondary | ICD-10-CM | POA: Insufficient documentation

## 2013-09-03 DIAGNOSIS — J45909 Unspecified asthma, uncomplicated: Secondary | ICD-10-CM

## 2013-09-03 DIAGNOSIS — I471 Supraventricular tachycardia: Secondary | ICD-10-CM

## 2013-09-03 DIAGNOSIS — R079 Chest pain, unspecified: Secondary | ICD-10-CM | POA: Insufficient documentation

## 2013-09-03 DIAGNOSIS — Z09 Encounter for follow-up examination after completed treatment for conditions other than malignant neoplasm: Secondary | ICD-10-CM | POA: Insufficient documentation

## 2013-09-03 DIAGNOSIS — K219 Gastro-esophageal reflux disease without esophagitis: Secondary | ICD-10-CM

## 2013-09-03 DIAGNOSIS — I498 Other specified cardiac arrhythmias: Secondary | ICD-10-CM

## 2013-09-03 DIAGNOSIS — I10 Essential (primary) hypertension: Secondary | ICD-10-CM

## 2013-09-03 MED ORDER — METOPROLOL SUCCINATE ER 25 MG PO TB24: 25.0000 mg | ORAL_TABLET | Freq: Every day | ORAL | Status: DC

## 2013-09-03 MED ORDER — PANTOPRAZOLE SODIUM 40 MG PO TBEC: 40.0000 mg | DELAYED_RELEASE_TABLET | Freq: Every day | ORAL | Status: DC

## 2013-09-03 NOTE — Progress Notes (Signed)
MRN: [id number] Name: Emily Clarke  Sex: female Age: 63 y.o. DOB: 09-Jun-1951  Allergies: Review of patient's allergies indicates no known allergies.  Chief Complaint  Patient presents with  . Follow-up    HPI: Patient is 63 y.o. female who  for followup, she was recently hospitalized with a complaint of chest discomfort, shortness of breath, EMR reviewed patient diagnosed with SVT was given IV Cardizem while in here and was started on Toprol-XL, troponins negative, she had echocardiogram done which showed EF of 55-60%, hydrochlorothiazide was discontinued , patient followed up with cardiology as outpatient, she was advised to continue with metoprolol. Albuterol was changed to Xopenex to prevent tachycardia. Patient denies any chest discomfort or shortness of breath. Patient is requesting refill on her medications.  Past Medical History  Diagnosis Date  . Tachycardia   . Hypertension   . Asthma     Past Surgical History  Procedure Laterality Date  . Abdominal hysterectomy    . Cholecystectomy        Medication List       This list is accurate as of: 09/03/13  9:24 AM.  Always use your most recent med list.               aspirin EC 81 MG tablet  Take 81 mg by mouth daily.     benzonatate 100 MG capsule  Commonly known as:  TESSALON  Take 1 capsule (100 mg total) by mouth 3 (three) times daily as needed for cough.     fluticasone 50 MCG/ACT nasal spray  Commonly known as:  FLONASE  Place 2 sprays into both nostrils daily.     levalbuterol 45 MCG/ACT inhaler  Commonly known as:  XOPENEX HFA  Inhale 1-2 puffs into the lungs every 4 (four) hours as needed for wheezing.     metoprolol succinate 25 MG 24 hr tablet  Commonly known as:  TOPROL-XL  Take 1 tablet (25 mg total) by mouth daily.     pantoprazole 40 MG tablet  Commonly known as:  PROTONIX  Take 1 tablet (40 mg total) by mouth daily.        Meds ordered this encounter  Medications  . metoprolol  succinate (TOPROL-XL) 25 MG 24 hr tablet    Sig: Take 1 tablet (25 mg total) by mouth daily.    Dispense:  30 tablet    Refill:  3  . pantoprazole (PROTONIX) 40 MG tablet    Sig: Take 1 tablet (40 mg total) by mouth daily.    Dispense:  30 tablet    Refill:  5    Order Specific Question:  Supervising Provider    Answer:  ROSS, PAULA V [[id number]]    Immunization History  Administered Date(s) Administered  . Influenza, Seasonal, Injecte, Preservative Fre 04/18/2013  . Tdap 08/28/2012    Family History  Problem Relation Age of Onset  . Heart disease Mother   . Prostate cancer Father   . Colon polyps Father   . Heart disease Father   . Hyperlipidemia Father   . Hyperlipidemia Mother   . Heart attack Mother   . Heart attack Father     History  Substance Use Topics  . Smoking status: Never Smoker   . Smokeless tobacco: Never Used  . Alcohol Use: No    Review of Systems   As noted in HPI  Filed Vitals:   09/03/13 0912  BP: 129/88  Pulse: 83  Temp: 98.1 F (36.7  C)  Resp: 16    Physical Exam  Physical Exam  Constitutional: No distress.  Eyes: EOM are normal. Pupils are equal, round, and reactive to light.  Cardiovascular: Normal rate and regular rhythm.   Pulmonary/Chest: Breath sounds normal. No respiratory distress. She has no wheezes. She has no rales.  Musculoskeletal: She exhibits no edema.    CBC    Component Value Date/Time   WBC 9.2 08/12/2013 0528   RBC 4.31 08/12/2013 0528   HGB 12.7 08/12/2013 0528   HCT 37.9 08/12/2013 0528   PLT 263 08/12/2013 0528   MCV 87.9 08/12/2013 0528   LYMPHSABS 2.0 03/18/2010 2149   MONOABS 0.5 03/18/2010 2149   EOSABS 0.1 03/18/2010 2149   BASOSABS 0.0 03/18/2010 2149    CMP     Component Value Date/Time   NA 146 08/12/2013 0528   K 4.0 08/12/2013 0528   CL 106 08/12/2013 0528   CO2 24 08/12/2013 0528   GLUCOSE 101* 08/12/2013 0528   BUN 15 08/12/2013 0528   CREATININE 0.73 08/12/2013 0528   CREATININE 0.63 01/09/2013  1550   CALCIUM 9.3 08/12/2013 0528   PROT 8.4* 10/11/2012 1737   ALBUMIN 3.9 10/11/2012 1737   AST 16 10/11/2012 1737   ALT 17 10/11/2012 1737   ALKPHOS 147* 10/11/2012 1737   BILITOT 0.3 10/11/2012 1737   GFRNONAA >90 08/12/2013 0528   GFRAA >90 08/12/2013 0528    Lab Results  Component Value Date/Time   CHOL 191 08/12/2013  5:28 AM    No components found with this basename: hga1c    Lab Results  Component Value Date/Time   AST 16 10/11/2012  5:37 PM    Assessment and Plan  Essential hypertension, benign Well-controlled continued current medications  SVT (supraventricular tachycardia)  Rate controlled continue with metoprolol following up with cardiology.  Reactive airway disease Continue with Xopenex, patient does not smoke cigarettes.  GERD (gastroesophageal reflux disease) - Plan: pantoprazole (PROTONIX) 40 MG tablet  Return in about 3 months (around 12/04/2013) for hypertension, asthma.  Lorayne Marek, MD

## 2013-09-03 NOTE — Progress Notes (Signed)
Patient here for hospital follow up Was seen for cough and rapid heart beat

## 2013-09-15 ENCOUNTER — Ambulatory Visit: Payer: Self-pay | Admitting: Internal Medicine

## 2013-10-01 ENCOUNTER — Other Ambulatory Visit: Payer: Self-pay | Admitting: Internal Medicine

## 2013-10-01 ENCOUNTER — Other Ambulatory Visit: Payer: Self-pay | Admitting: Obstetrics and Gynecology

## 2013-10-01 DIAGNOSIS — N6002 Solitary cyst of left breast: Secondary | ICD-10-CM

## 2013-10-01 DIAGNOSIS — Z1231 Encounter for screening mammogram for malignant neoplasm of breast: Secondary | ICD-10-CM

## 2013-10-09 ENCOUNTER — Ambulatory Visit
Admission: RE | Admit: 2013-10-09 | Discharge: 2013-10-09 | Disposition: A | Discharge status: Home / Self Care | Payer: No Typology Code available for payment source | Source: Ambulatory Visit | Attending: Obstetrics and Gynecology | Admitting: Obstetrics and Gynecology

## 2013-10-09 DIAGNOSIS — N6002 Solitary cyst of left breast: Secondary | ICD-10-CM

## 2013-10-31 ENCOUNTER — Ambulatory Visit: Payer: No Typology Code available for payment source | Admitting: Internal Medicine

## 2013-11-01 ENCOUNTER — Emergency Department (HOSPITAL_COMMUNITY): Payer: No Typology Code available for payment source

## 2013-11-01 ENCOUNTER — Encounter (HOSPITAL_COMMUNITY): Payer: Self-pay | Admitting: Emergency Medicine

## 2013-11-01 ENCOUNTER — Emergency Department (HOSPITAL_COMMUNITY)
Admission: EM | Admit: 2013-11-01 | Discharge: 2013-11-01 | Disposition: A | Discharge status: Home / Self Care | Payer: No Typology Code available for payment source | Attending: Emergency Medicine | Admitting: Emergency Medicine

## 2013-11-01 DIAGNOSIS — J45909 Unspecified asthma, uncomplicated: Secondary | ICD-10-CM | POA: Insufficient documentation

## 2013-11-01 DIAGNOSIS — Z7982 Long term (current) use of aspirin: Secondary | ICD-10-CM | POA: Insufficient documentation

## 2013-11-01 DIAGNOSIS — I10 Essential (primary) hypertension: Secondary | ICD-10-CM | POA: Insufficient documentation

## 2013-11-01 DIAGNOSIS — Z79899 Other long term (current) drug therapy: Secondary | ICD-10-CM | POA: Insufficient documentation

## 2013-11-01 DIAGNOSIS — R0789 Other chest pain: Secondary | ICD-10-CM

## 2013-11-01 LAB — BASIC METABOLIC PANEL
BUN: 11 mg/dL (ref 6–23)
CO2: 25 mEq/L (ref 19–32)
Calcium: 9.2 mg/dL (ref 8.4–10.5)
Chloride: 105 mEq/L (ref 96–112)
Creatinine, Ser: 0.72 mg/dL (ref 0.50–1.10)
GFR calc Af Amer: >90 mL/min (ref >90)
GFR calc non Af Amer: >90 mL/min (ref >90)
Glucose, Bld: 105 mg/dL — ABNORMAL HIGH (ref 70–99)
Potassium: 3.8 mEq/L (ref 3.7–5.3)
Sodium: 144 mEq/L (ref 137–147)

## 2013-11-01 LAB — I-STAT TROPONIN, ED
Troponin i, poc: 0.01 ng/mL (ref 0.00–0.08)
Troponin i, poc: 0 ng/mL (ref 0.00–0.08)

## 2013-11-01 LAB — CBC
HCT: 36.9 % (ref 36.0–46.0)
Hemoglobin: 12.3 g/dL (ref 12.0–15.0)
MCH: 29.1 pg (ref 26.0–34.0)
MCHC: 33.3 g/dL (ref 30.0–36.0)
MCV: 87.4 fL (ref 78.0–100.0)
Platelets: 223 10*3/uL (ref 150–400)
RBC: 4.22 MIL/uL (ref 3.87–5.11)
RDW: 13.2 % (ref 11.5–15.5)
WBC: 8.3 10*3/uL (ref 4.0–10.5)

## 2013-11-01 MED ORDER — IBUPROFEN 800 MG PO TABS: 800.0000 mg | ORAL_TABLET | Freq: Three times a day (TID) | ORAL | Status: DC | PRN

## 2013-11-01 MED ORDER — ASPIRIN 81 MG PO CHEW
324.0000 mg | CHEWABLE_TABLET | Freq: Once | ORAL | Status: AC
  Administered 2013-11-01: 324 mg via ORAL
  Filled 2013-11-01: qty 4

## 2013-11-01 NOTE — Discharge Instructions (Signed)
He may also take Tylenol as needed.  Return here for any worsening in her condition.  Followup with your cardiologist and primary care Dr.

## 2013-11-01 NOTE — ED Notes (Signed)
I gave the patient a happy meal and 2 containers of apple juice. 

## 2013-11-01 NOTE — ED Provider Notes (Signed)
Medical screening examination/treatment/procedure(s) were performed by non-physician practitioner and as supervising physician I was immediately available for consultation/collaboration.   EKG Interpretation   Date/Time:  Saturday Nov 01 2013 08:06:40 EDT Ventricular Rate:  71 PR Interval:  190 QRS Duration: 79 QT Interval:  404 QTC Calculation: 439 R Axis:   58 Text Interpretation:  Sinus rhythm Consider left atrial enlargement No  significant change since last tracing Confirmed by [facility]  MD, Chidubem Chaires  [phone]) on 11/01/2013 8:15:23 AM        Juanda Crumble B. Karle Starch, MD 11/01/13 1615

## 2013-11-01 NOTE — ED Notes (Signed)
Pt discharged to home with family. NAD.  

## 2013-11-01 NOTE — ED Provider Notes (Signed)
CSN: [id number]     Arrival date & time 11/01/13  8182 History   First MD Initiated Contact with Patient 11/01/13 [phone]     Chief Complaint  Patient presents with  . Chest Pain     (Consider location/radiation/quality/duration/timing/severity/associated sxs/prior Treatment) Patient is a 63 y.o. female presenting with chest pain.  Chest Pain  Patient presents to the emergency department with with onset of chest discomfort.  Around 7 AM the patient, states, that she had a pressure-like sensation that lasts for 5-10 seconds.  Patient, states, that the pain did not seem to radiate.  Patient also denies any diaphoresis, shortness of breath, nausea, vomiting, diarrhea, weakness, numbness, dizziness, headache, blurred vision, back pain, fever, cough, runny nose, sore throat, exertional shortness of breath, exertional chest pain, rash, or syncope.  The patient, states, that she did not take any medications prior to arrival for her symptoms.  Patient, states, that nothing seems make her condition, better or worse.  Patient, states she was admitted in February for palpitations. Past Medical History  Diagnosis Date  . Tachycardia   . Hypertension   . Asthma    Past Surgical History  Procedure Laterality Date  . Abdominal hysterectomy    . Cholecystectomy     Family History  Problem Relation Age of Onset  . Heart disease Mother   . Prostate cancer Father   . Colon polyps Father   . Heart disease Father   . Hyperlipidemia Father   . Hyperlipidemia Mother   . Heart attack Mother   . Heart attack Father    History  Substance Use Topics  . Smoking status: Never Smoker   . Smokeless tobacco: Never Used  . Alcohol Use: No   OB History   Grav Para Term Preterm Abortions TAB SAB Ect Mult Living   3 3 3       2      Review of Systems  Cardiovascular: Positive for chest pain.    All other systems negative except as documented in the HPI. All pertinent positives and negatives as reviewed in  the HPI.  Allergies  Review of patient's allergies indicates no known allergies.  Home Medications   Prior to Admission medications   Medication Sig Start Date End Date Taking? Authorizing Provider  aspirin EC 81 MG tablet Take 81 mg by mouth daily.   Yes Historical Provider, MD  metoprolol succinate (TOPROL-XL) 25 MG 24 hr tablet Take 1 tablet (25 mg total) by mouth daily. 09/03/13  Yes Deepak Advani, MD  pantoprazole (PROTONIX) 40 MG tablet Take 1 tablet (40 mg total) by mouth daily. 09/03/13  Yes Lorayne Marek, MD  levalbuterol (XOPENEX HFA) 45 MCG/ACT inhaler Inhale 1-2 puffs into the lungs every 4 (four) hours as needed for wheezing. 08/12/13   Brittainy Simmons, PA-C   BP 142/91  Pulse 57  Temp(Src) 97.6 F (36.4 C) (Oral)  Resp 18  Ht 5' 4.5" (1.638 m)  Wt 160 lb (72.576 kg)  BMI 27.05 kg/m2  SpO2 96% Physical Exam  Nursing note and vitals reviewed. Constitutional: She is oriented to person, place, and time. She appears well-developed and well-nourished. No distress.  HENT:  Head: Normocephalic and atraumatic.  Mouth/Throat: Oropharynx is clear and moist.  Eyes: Pupils are equal, round, and reactive to light.  Neck: Normal range of motion. Neck supple.  Cardiovascular: Normal rate, regular rhythm and normal heart sounds.  Exam reveals no gallop and no friction rub.   No murmur heard. Pulmonary/Chest: Effort normal  and breath sounds normal. No respiratory distress. She has no wheezes.  Abdominal: Soft. Bowel sounds are normal.  Neurological: She is alert and oriented to person, place, and time. She exhibits normal muscle tone. Coordination normal.  Skin: Skin is warm and dry. No rash noted. No erythema.    ED Course  Procedures (including critical care time) Labs Review Labs Reviewed  BASIC METABOLIC PANEL - Abnormal; Notable for the following:    Glucose, Bld 105 (*)    All other components within normal limits  CBC  I-STAT TROPOININ, ED  Randolm Idol, ED     Imaging Review Dg Chest 2 View  11/01/2013   CLINICAL DATA:  Chest pain, tachycardia, cough.  EXAM: CHEST  2 VIEW  COMPARISON:  08/11/2013  FINDINGS: Cardiomediastinal silhouette is within normal limits. Lungs are well inflated with a similar appearance of mild bronchitic changes bilaterally. There is no evidence of confluent airspace opacity, edema, pleural effusion, or pneumothorax. No acute osseous abnormality is identified.  IMPRESSION: Stable, mild bronchitic changes. No evidence of bacterial pneumonia.   Electronically Signed   By: Logan Bores   On: 11/01/2013 09:31     EKG Interpretation   Date/Time:  Saturday Nov 01 2013 08:06:40 EDT Ventricular Rate:  71 PR Interval:  190 QRS Duration: 79 QT Interval:  404 QTC Calculation: 439 R Axis:   58 Text Interpretation:  Sinus rhythm Consider left atrial enlargement No  significant change since last tracing Confirmed by SHELDON  MD, Juanda Crumble  [phone]) on 11/01/2013 8:15:23 AM      The patient has atypical symptoms for cardiac chest pain, as it lasts for 5-10 seconds and does not have any exertional component.  The patient, states, that nothing seems to make her condition, better or worse.  The patient does not have any diaphoresis, nausea, or shortness of breath.  The patient will be referred back to her primary care Dr. told to return here as needed.  She did have a workup in February for chest discomfort and palpitations.  Patient is advised to return here for any worsening in her condition    Brent General, PA-C 11/01/13 1408

## 2013-11-01 NOTE — ED Notes (Signed)
Pt reporting this morning at 0730 left sided chest discomfort and she felt hot. Reports needs blood pressure checked. States last time she felt this way she was admitted in Feb. Pt is a x 4. Skin warm and dry. Rates pain 6/10.

## 2013-11-18 ENCOUNTER — Encounter: Payer: Self-pay | Admitting: Internal Medicine

## 2013-11-18 ENCOUNTER — Ambulatory Visit (INDEPENDENT_AMBULATORY_CARE_PROVIDER_SITE_OTHER): Payer: No Typology Code available for payment source | Admitting: Internal Medicine

## 2013-11-18 VITALS — BP 136/84 | HR 84 | Ht 64.5 in | Wt 156.0 lb

## 2013-11-18 DIAGNOSIS — I471 Supraventricular tachycardia: Secondary | ICD-10-CM

## 2013-11-18 DIAGNOSIS — I10 Essential (primary) hypertension: Secondary | ICD-10-CM | POA: Insufficient documentation

## 2013-11-18 DIAGNOSIS — I498 Other specified cardiac arrhythmias: Secondary | ICD-10-CM

## 2013-11-18 NOTE — Patient Instructions (Signed)
Your physician recommends that you schedule a follow-up appointment as needed  

## 2013-11-18 NOTE — Progress Notes (Signed)
      HPI Emily Clarke returns today for followup. She has a h/o SVT and HTN and has been well controlled. She denies chest pain or sob or palpitations. In the interim she has been stable but has had occaisional episodes of HTN. She denies other symptoms. No Known Allergies   Current Outpatient Prescriptions  Medication Sig Dispense Refill  . aspirin EC 81 MG tablet Take 81 mg by mouth daily.      Marland Kitchen ibuprofen (ADVIL,MOTRIN) 800 MG tablet Take 1 tablet (800 mg total) by mouth every 8 (eight) hours as needed.  21 tablet  0  . metoprolol succinate (TOPROL-XL) 25 MG 24 hr tablet Take 1 tablet (25 mg total) by mouth daily.  30 tablet  3  . pantoprazole (PROTONIX) 40 MG tablet Take 1 tablet (40 mg total) by mouth daily.  30 tablet  5   No current facility-administered medications for this visit.     Past Medical History  Diagnosis Date  . Tachycardia   . Hypertension   . Asthma   . SVT (supraventricular tachycardia)     ROS:   All systems reviewed and negative except as noted in the HPI.   Past Surgical History  Procedure Laterality Date  . Abdominal hysterectomy    . Cholecystectomy       Family History  Problem Relation Age of Onset  . Heart disease Mother   . Prostate cancer Father   . Colon polyps Father   . Heart disease Father   . Hyperlipidemia Father   . Hyperlipidemia Mother   . Heart attack Mother   . Heart attack Father      History   Social History  . Marital Status: Divorced    Spouse Name: N/A    Number of Children: 3  . Years of Education: N/A   Occupational History  .     Social History Main Topics  . Smoking status: Never Smoker   . Smokeless tobacco: Never Used  . Alcohol Use: No  . Drug Use: No  . Sexual Activity: Yes    Birth Control/ Protection: Surgical   Other Topics Concern  . Not on file   Social History Narrative  . No narrative on file     BP 136/84  Pulse 84  Ht 5' 4.5" (1.638 m)  Wt 156 lb (70.761 kg)  BMI 26.37  kg/m2  SpO2 96%  Physical Exam:  Well appearing NAD HEENT: Unremarkable Neck:  No JVD, no thyromegally Lymphatics:  No adenopathy Back:  No CVA tenderness Lungs:  Clear HEART:  Regular rate rhythm, no murmurs, no rubs, no clicks Abd:  soft, positive bowel sounds, no organomegally, no rebound, no guarding Ext:  2 plus pulses, no edema, no cyanosis, no clubbing Skin:  No rashes no nodules Neuro:  CN II through XII intact, motor grossly intact   Assess/Plan:

## 2013-11-18 NOTE — Assessment & Plan Note (Signed)
Her blood pressure is controlled today but has been elevated in the past at work. I have recommended she continue her current meds. We considered switching to coreg. For now will hold off on the switch.

## 2013-11-18 NOTE — Assessment & Plan Note (Signed)
Her symptoms are well controlled on beta blocker therapy which she needs for HTN. I have recommended a period of watchful waiting.

## 2013-12-04 ENCOUNTER — Ambulatory Visit: Payer: No Typology Code available for payment source | Admitting: Internal Medicine

## 2013-12-31 ENCOUNTER — Ambulatory Visit: Payer: No Typology Code available for payment source | Admitting: Internal Medicine

## 2014-02-19 ENCOUNTER — Encounter: Payer: Self-pay | Admitting: Internal Medicine

## 2014-02-19 ENCOUNTER — Ambulatory Visit: Payer: No Typology Code available for payment source | Attending: Internal Medicine | Admitting: Internal Medicine

## 2014-02-19 VITALS — BP 151/96 | HR 68 | Temp 98.2°F | Resp 16

## 2014-02-19 DIAGNOSIS — Z860100 Personal history of colon polyps, unspecified: Secondary | ICD-10-CM | POA: Insufficient documentation

## 2014-02-19 DIAGNOSIS — Z79899 Other long term (current) drug therapy: Secondary | ICD-10-CM | POA: Insufficient documentation

## 2014-02-19 DIAGNOSIS — K2100 Gastro-esophageal reflux disease with esophagitis, without bleeding: Secondary | ICD-10-CM

## 2014-02-19 DIAGNOSIS — I10 Essential (primary) hypertension: Secondary | ICD-10-CM | POA: Insufficient documentation

## 2014-02-19 DIAGNOSIS — Z7982 Long term (current) use of aspirin: Secondary | ICD-10-CM | POA: Insufficient documentation

## 2014-02-19 DIAGNOSIS — K219 Gastro-esophageal reflux disease without esophagitis: Secondary | ICD-10-CM | POA: Insufficient documentation

## 2014-02-19 DIAGNOSIS — D126 Benign neoplasm of colon, unspecified: Secondary | ICD-10-CM

## 2014-02-19 MED ORDER — PANTOPRAZOLE SODIUM 40 MG PO TBEC: 40.0000 mg | DELAYED_RELEASE_TABLET | Freq: Every day | ORAL | Status: DC

## 2014-02-19 MED ORDER — METOPROLOL SUCCINATE ER 25 MG PO TB24: 25.0000 mg | ORAL_TABLET | Freq: Every day | ORAL | Status: DC

## 2014-02-19 NOTE — Progress Notes (Signed)
Patient here for follow up and medication refill

## 2014-02-19 NOTE — Progress Notes (Signed)
MRN: [id number] Name: Emily Clarke  Sex: female Age: 63 y.o. DOB: [birth year]  Allergies: Review of patient's allergies indicates no known allergies.  Chief Complaint  Patient presents with  . Follow-up    HPI: Patient is 63 y.o. female who history of GERD hypertension tachycardia comes today for followup, patient requesting refill on her medication as she ran out of her meds for the last 2-3 days, her blood pressure is borderline elevated denies any headache dizziness chest and shortness of breath, she also had a colonoscopy done in 2000 and reported to have tubular adenoma, she is up-to-date with her mammogram.  Past Medical History  Diagnosis Date  . Tachycardia   . Hypertension   . Asthma   . SVT (supraventricular tachycardia)     Past Surgical History  Procedure Laterality Date  . Abdominal hysterectomy    . Cholecystectomy        Medication List       This list is accurate as of: 02/19/14  5:44 PM.  Always use your most recent med list.               aspirin EC 81 MG tablet  Take 81 mg by mouth daily.     ibuprofen 800 MG tablet  Commonly known as:  ADVIL,MOTRIN  Take 1 tablet (800 mg total) by mouth every 8 (eight) hours as needed.     metoprolol succinate 25 MG 24 hr tablet  Commonly known as:  TOPROL-XL  Take 1 tablet (25 mg total) by mouth daily.     pantoprazole 40 MG tablet  Commonly known as:  PROTONIX  Take 1 tablet (40 mg total) by mouth daily.        Meds ordered this encounter  Medications  . metoprolol succinate (TOPROL-XL) 25 MG 24 hr tablet    Sig: Take 1 tablet (25 mg total) by mouth daily.    Dispense:  30 tablet    Refill:  3  . pantoprazole (PROTONIX) 40 MG tablet    Sig: Take 1 tablet (40 mg total) by mouth daily.    Dispense:  30 tablet    Refill:  5    Immunization History  Administered Date(s) Administered  . Influenza, Seasonal, Injecte, Preservative Fre 04/18/2013  . Tdap 08/28/2012    Family History    Problem Relation Age of Onset  . Heart disease Mother   . Prostate cancer Father   . Colon polyps Father   . Heart disease Father   . Hyperlipidemia Father   . Hyperlipidemia Mother   . Heart attack Mother   . Heart attack Father     History  Substance Use Topics  . Smoking status: Never Smoker   . Smokeless tobacco: Never Used  . Alcohol Use: No    Review of Systems   As noted in HPI  Filed Vitals:   02/19/14 1722  BP: 151/96  Pulse: 68  Temp: 98.2 F (36.8 C)  Resp: 16    Physical Exam  Physical Exam  Constitutional: No distress.  Eyes: EOM are normal. Pupils are equal, round, and reactive to light.  Cardiovascular: Normal rate and regular rhythm.   Pulmonary/Chest: Breath sounds normal. No respiratory distress. She has no wheezes. She has no rales.  Musculoskeletal: She exhibits no edema.    CBC    Component Value Date/Time   WBC 8.3 11/01/2013 0820   RBC 4.22 11/01/2013 0820   HGB 12.3 11/01/2013 0820   HCT 36.9  11/01/2013 0820   PLT 223 11/01/2013 0820   MCV 87.4 11/01/2013 0820   LYMPHSABS 2.0 03/18/2010 2149   MONOABS 0.5 03/18/2010 2149   EOSABS 0.1 03/18/2010 2149   BASOSABS 0.0 03/18/2010 2149    CMP     Component Value Date/Time   NA 144 11/01/2013 0820   K 3.8 11/01/2013 0820   CL 105 11/01/2013 0820   CO2 25 11/01/2013 0820   GLUCOSE 105* 11/01/2013 0820   BUN 11 11/01/2013 0820   CREATININE 0.72 11/01/2013 0820   CREATININE 0.63 01/09/2013 1550   CALCIUM 9.2 11/01/2013 0820   PROT 8.4* 10/11/2012 1737   ALBUMIN 3.9 10/11/2012 1737   AST 16 10/11/2012 1737   ALT 17 10/11/2012 1737   ALKPHOS 147* 10/11/2012 1737   BILITOT 0.3 10/11/2012 1737   GFRNONAA >90 11/01/2013 0820   GFRAA >90 11/01/2013 0820    Lab Results  Component Value Date/Time   CHOL 191 08/12/2013  5:28 AM    No components found with this basename: hga1c    Lab Results  Component Value Date/Time   AST 16 10/11/2012  5:37 PM    Assessment and Plan  Gastroesophageal reflux disease with  esophagitis - Plan: Lifestyle modification, continue with pantoprazole (PROTONIX) 40 MG tablet  Essential hypertension, benign - Plan: Advised for DASH diet continue with her metoprolol succinate (TOPROL-XL) 25 MG 24 hr tablet  Tubular adenoma of colon - Plan: Ambulatory referral to Gastroenterology   Health Maintenance -Colonoscopy: referred to GI for repeat colonoscopy, patient has history of tubular adenoma 2012   -Mammogram: uptodate    Return in about 3 months (around 05/22/2014) for hypertension, gerd.  Lorayne Marek, MD

## 2014-03-02 ENCOUNTER — Other Ambulatory Visit: Payer: Self-pay | Admitting: Obstetrics and Gynecology

## 2014-03-02 DIAGNOSIS — Z1231 Encounter for screening mammogram for malignant neoplasm of breast: Secondary | ICD-10-CM

## 2014-03-04 ENCOUNTER — Ambulatory Visit: Payer: No Typology Code available for payment source

## 2014-03-24 ENCOUNTER — Ambulatory Visit (HOSPITAL_COMMUNITY): Payer: Self-pay

## 2014-03-24 ENCOUNTER — Ambulatory Visit (HOSPITAL_COMMUNITY): Payer: Self-pay | Attending: Obstetrics and Gynecology

## 2014-04-06 ENCOUNTER — Ambulatory Visit: Payer: No Typology Code available for payment source | Attending: Internal Medicine | Admitting: Internal Medicine

## 2014-04-06 ENCOUNTER — Encounter: Payer: Self-pay | Admitting: Internal Medicine

## 2014-04-06 VITALS — BP 162/94 | HR 65 | Temp 97.6°F | Resp 16 | Ht 64.5 in | Wt 152.0 lb

## 2014-04-06 DIAGNOSIS — Z23 Encounter for immunization: Secondary | ICD-10-CM

## 2014-04-06 DIAGNOSIS — I10 Essential (primary) hypertension: Secondary | ICD-10-CM | POA: Insufficient documentation

## 2014-04-06 DIAGNOSIS — R059 Cough, unspecified: Secondary | ICD-10-CM

## 2014-04-06 DIAGNOSIS — Z79899 Other long term (current) drug therapy: Secondary | ICD-10-CM | POA: Insufficient documentation

## 2014-04-06 DIAGNOSIS — Z7982 Long term (current) use of aspirin: Secondary | ICD-10-CM | POA: Insufficient documentation

## 2014-04-06 DIAGNOSIS — R05 Cough: Secondary | ICD-10-CM

## 2014-04-06 MED ORDER — AZITHROMYCIN 250 MG PO TABS: ORAL_TABLET | ORAL | Status: DC

## 2014-04-06 MED ORDER — GUAIFENESIN-DM 100-10 MG/5ML PO SYRP: 5.0000 mL | ORAL_SOLUTION | ORAL | Status: DC | PRN

## 2014-04-06 NOTE — Patient Instructions (Signed)
DASH Eating Plan DASH stands for "Dietary Approaches to Stop Hypertension." The DASH eating plan is a healthy eating plan that has been shown to reduce high blood pressure (hypertension). Additional health benefits may include reducing the risk of type 2 diabetes mellitus, heart disease, and stroke. The DASH eating plan may also help with weight loss. WHAT DO I NEED TO KNOW ABOUT THE DASH EATING PLAN? For the DASH eating plan, you will follow these general guidelines:  Choose foods with a percent daily value for sodium of less than 5% (as listed on the food label).  Use salt-free seasonings or herbs instead of table salt or sea salt.  Check with your health care provider or pharmacist before using salt substitutes.  Eat lower-sodium products, often labeled as "lower sodium" or "no salt added."  Eat fresh foods.  Eat more vegetables, fruits, and low-fat dairy products.  Choose whole grains. Look for the word "whole" as the first word in the ingredient list.  Choose fish and skinless chicken or turkey more often than red meat. Limit fish, poultry, and meat to 6 oz (170 g) each day.  Limit sweets, desserts, sugars, and sugary drinks.  Choose heart-healthy fats.  Limit cheese to 1 oz (28 g) per day.  Eat more home-cooked food and less restaurant, buffet, and fast food.  Limit fried foods.  Cook foods using methods other than frying.  Limit canned vegetables. If you do use them, rinse them well to decrease the sodium.  When eating at a restaurant, ask that your food be prepared with less salt, or no salt if possible. WHAT FOODS CAN I EAT? Seek help from a dietitian for individual calorie needs. Grains Whole grain or whole wheat bread. Brown rice. Whole grain or whole wheat pasta. Quinoa, bulgur, and whole grain cereals. Low-sodium cereals. Corn or whole wheat flour tortillas. Whole grain cornbread. Whole grain crackers. Low-sodium crackers. Vegetables Fresh or frozen vegetables  (raw, steamed, roasted, or grilled). Low-sodium or reduced-sodium tomato and vegetable juices. Low-sodium or reduced-sodium tomato sauce and paste. Low-sodium or reduced-sodium canned vegetables.  Fruits All fresh, canned (in natural juice), or frozen fruits. Meat and Other Protein Products Ground beef (85% or leaner), grass-fed beef, or beef trimmed of fat. Skinless chicken or turkey. Ground chicken or turkey. Pork trimmed of fat. All fish and seafood. Eggs. Dried beans, peas, or lentils. Unsalted nuts and seeds. Unsalted canned beans. Dairy Low-fat dairy products, such as skim or 1% milk, 2% or reduced-fat cheeses, low-fat ricotta or cottage cheese, or plain low-fat yogurt. Low-sodium or reduced-sodium cheeses. Fats and Oils Tub margarines without trans fats. Light or reduced-fat mayonnaise and salad dressings (reduced sodium). Avocado. Safflower, olive, or canola oils. Natural peanut or almond butter. Other Unsalted popcorn and pretzels. The items listed above may not be a complete list of recommended foods or beverages. Contact your dietitian for more options. WHAT FOODS ARE NOT RECOMMENDED? Grains White bread. White pasta. White rice. Refined cornbread. Bagels and croissants. Crackers that contain trans fat. Vegetables Creamed or fried vegetables. Vegetables in a cheese sauce. Regular canned vegetables. Regular canned tomato sauce and paste. Regular tomato and vegetable juices. Fruits Dried fruits. Canned fruit in light or heavy syrup. Fruit juice. Meat and Other Protein Products Fatty cuts of meat. Ribs, chicken wings, bacon, sausage, bologna, salami, chitterlings, fatback, hot dogs, bratwurst, and packaged luncheon meats. Salted nuts and seeds. Canned beans with salt. Dairy Whole or 2% milk, cream, half-and-half, and cream cheese. Whole-fat or sweetened yogurt. Full-fat   cheeses or blue cheese. Nondairy creamers and whipped toppings. Processed cheese, cheese spreads, or cheese  curds. Condiments Onion and garlic salt, seasoned salt, table salt, and sea salt. Canned and packaged gravies. Worcestershire sauce. Tartar sauce. Barbecue sauce. Teriyaki sauce. Soy sauce, including reduced sodium. Steak sauce. Fish sauce. Oyster sauce. Cocktail sauce. Horseradish. Ketchup and mustard. Meat flavorings and tenderizers. Bouillon cubes. Hot sauce. Tabasco sauce. Marinades. Taco seasonings. Relishes. Fats and Oils Butter, stick margarine, lard, shortening, ghee, and bacon fat. Coconut, palm kernel, or palm oils. Regular salad dressings. Other Pickles and olives. Salted popcorn and pretzels. The items listed above may not be a complete list of foods and beverages to avoid. Contact your dietitian for more information. WHERE CAN I FIND MORE INFORMATION? National Heart, Lung, and Blood Institute: travelstabloid.com Document Released: 06/08/2011 Document Revised: 11/03/2013 Document Reviewed: 04/23/2013 [facility] Patient Information 2015 [place], [place]. This information is not intended to replace advice given to you by your health care provider. Make sure you discuss any questions you have with your health care provider. Hypertension Hypertension, commonly called high blood pressure, is when the force of blood pumping through your arteries is too strong. Your arteries are the blood vessels that carry blood from your heart throughout your body. A blood pressure reading consists of a higher number over a lower number, such as 110/72. The higher number (systolic) is the pressure inside your arteries when your heart pumps. The lower number (diastolic) is the pressure inside your arteries when your heart relaxes. Ideally you want your blood pressure below 120/80. Hypertension forces your heart to work harder to pump blood. Your arteries may become narrow or stiff. Having hypertension puts you at risk for heart disease, stroke, and other problems.  RISK  FACTORS Some risk factors for high blood pressure are controllable. Others are not.  Risk factors you cannot control include:   Race. You may be at higher risk if you are African American.  Age. Risk increases with age.  Gender. Men are at higher risk than women before age 37 years. After age 55, women are at higher risk than men. Risk factors you can control include:  Not getting enough exercise or physical activity.  Being overweight.  Getting too much fat, sugar, calories, or salt in your diet.  Drinking too much alcohol. SIGNS AND SYMPTOMS Hypertension does not usually cause signs or symptoms. Extremely high blood pressure (hypertensive crisis) may cause headache, anxiety, shortness of breath, and nosebleed. DIAGNOSIS  To check if you have hypertension, your health care provider will measure your blood pressure while you are seated, with your arm held at the level of your heart. It should be measured at least twice using the same arm. Certain conditions can cause a difference in blood pressure between your right and left arms. A blood pressure reading that is higher than normal on one occasion does not mean that you need treatment. If one blood pressure reading is high, ask your health care provider about having it checked again. TREATMENT  Treating high blood pressure includes making lifestyle changes and possibly taking medicine. Living a healthy lifestyle can help lower high blood pressure. You may need to change some of your habits. Lifestyle changes may include:  Following the DASH diet. This diet is high in fruits, vegetables, and whole grains. It is low in salt, red meat, and added sugars.  Getting at least 2 hours of brisk physical activity every week.  Losing weight if necessary.  Not smoking.  Limiting  alcoholic beverages.  Learning ways to reduce stress. If lifestyle changes are not enough to get your blood pressure under control, your health care provider may  prescribe medicine. You may need to take more than one. Work closely with your health care provider to understand the risks and benefits. HOME CARE INSTRUCTIONS  Have your blood pressure rechecked as directed by your health care provider.   Take medicines only as directed by your health care provider. Follow the directions carefully. Blood pressure medicines must be taken as prescribed. The medicine does not work as well when you skip doses. Skipping doses also puts you at risk for problems.   Do not smoke.   Monitor your blood pressure at home as directed by your health care provider. SEEK MEDICAL CARE IF:   You think you are having a reaction to medicines taken.  You have recurrent headaches or feel dizzy.  You have swelling in your ankles.  You have trouble with your vision. SEEK IMMEDIATE MEDICAL CARE IF:  You develop a severe headache or confusion.  You have unusual weakness, numbness, or feel faint.  You have severe chest or abdominal pain.  You vomit repeatedly.  You have trouble breathing. MAKE SURE YOU:   Understand these instructions.  Will watch your condition.  Will get help right away if you are not doing well or get worse. Document Released: 06/19/2005 Document Revised: 11/03/2013 Document Reviewed: 04/11/2013 [facility] Patient Information 2015 [place], [place]. This information is not intended to replace advice given to you by your health care provider. Make sure you discuss any questions you have with your health care provider. Upper Respiratory Infection, Adult An upper respiratory infection (URI) is also sometimes known as the common cold. The upper respiratory tract includes the nose, sinuses, throat, trachea, and bronchi. Bronchi are the airways leading to the lungs. Most people improve within 1 week, but symptoms can last up to 2 weeks. A residual cough may last even longer.  CAUSES Many different viruses can infect the tissues lining the upper  respiratory tract. The tissues become irritated and inflamed and often become very moist. Mucus production is also common. A cold is contagious. You can easily spread the virus to others by oral contact. This includes kissing, sharing a glass, coughing, or sneezing. Touching your mouth or nose and then touching a surface, which is then touched by another person, can also spread the virus. SYMPTOMS  Symptoms typically develop 1 to 3 days after you come in contact with a cold virus. Symptoms vary from person to person. They may include:  Runny nose.  Sneezing.  Nasal congestion.  Sinus irritation.  Sore throat.  Loss of voice (laryngitis).  Cough.  Fatigue.  Muscle aches.  Loss of appetite.  Headache.  Low-grade fever. DIAGNOSIS  You might diagnose your own cold based on familiar symptoms, since most people get a cold 2 to 3 times a year. Your caregiver can confirm this based on your exam. Most importantly, your caregiver can check that your symptoms are not due to another disease such as strep throat, sinusitis, pneumonia, asthma, or epiglottitis. Blood tests, throat tests, and X-rays are not necessary to diagnose a common cold, but they may sometimes be helpful in excluding other more serious diseases. Your caregiver will decide if any further tests are required. RISKS AND COMPLICATIONS  You may be at risk for a more severe case of the common cold if you smoke cigarettes, have chronic heart disease (such as heart failure) or  lung disease (such as asthma), or if you have a weakened immune system. The very young and very old are also at risk for more serious infections. Bacterial sinusitis, middle ear infections, and bacterial pneumonia can complicate the common cold. The common cold can worsen asthma and chronic obstructive pulmonary disease (COPD). Sometimes, these complications can require emergency medical care and may be life-threatening. PREVENTION  The best way to protect against  getting a cold is to practice good hygiene. Avoid oral or hand contact with people with cold symptoms. Wash your hands often if contact occurs. There is no clear evidence that vitamin C, vitamin E, echinacea, or exercise reduces the chance of developing a cold. However, it is always recommended to get plenty of rest and practice good nutrition. TREATMENT  Treatment is directed at relieving symptoms. There is no cure. Antibiotics are not effective, because the infection is caused by a virus, not by bacteria. Treatment may include:  Increased fluid intake. Sports drinks offer valuable electrolytes, sugars, and fluids.  Breathing heated mist or steam (vaporizer or shower).  Eating chicken soup or other clear broths, and maintaining good nutrition.  Getting plenty of rest.  Using gargles or lozenges for comfort.  Controlling fevers with ibuprofen or acetaminophen as directed by your caregiver.  Increasing usage of your inhaler if you have asthma. Zinc gel and zinc lozenges, taken in the first 24 hours of the common cold, can shorten the duration and lessen the severity of symptoms. Pain medicines may help with fever, muscle aches, and throat pain. A variety of non-prescription medicines are available to treat congestion and runny nose. Your caregiver can make recommendations and may suggest nasal or lung inhalers for other symptoms.  HOME CARE INSTRUCTIONS   Only take over-the-counter or prescription medicines for pain, discomfort, or fever as directed by your caregiver.  Use a warm mist humidifier or inhale steam from a shower to increase air moisture. This may keep secretions moist and make it easier to breathe.  Drink enough water and fluids to keep your urine clear or pale yellow.  Rest as needed.  Return to work when your temperature has returned to normal or as your caregiver advises. You may need to stay home longer to avoid infecting others. You can also use a face mask and careful  hand washing to prevent spread of the virus. SEEK MEDICAL CARE IF:   After the first few days, you feel you are getting worse rather than better.  You need your caregiver's advice about medicines to control symptoms.  You develop chills, worsening shortness of breath, or brown or red sputum. These may be signs of pneumonia.  You develop yellow or brown nasal discharge or pain in the face, especially when you bend forward. These may be signs of sinusitis.  You develop a fever, swollen neck glands, pain with swallowing, or white areas in the back of your throat. These may be signs of strep throat. SEEK IMMEDIATE MEDICAL CARE IF:   You have a fever.  You develop severe or persistent headache, ear pain, sinus pain, or chest pain.  You develop wheezing, a prolonged cough, cough up blood, or have a change in your usual mucus (if you have chronic lung disease).  You develop sore muscles or a stiff neck. Document Released: 12/13/2000 Document Revised: 09/11/2011 Document Reviewed: 09/24/2013 [facility] Patient Information 2015 [place], [place]. This information is not intended to replace advice given to you by your health care provider. Make sure you discuss any  questions you have with your health care provider.  

## 2014-04-06 NOTE — Progress Notes (Signed)
Patient ID: Emily Clarke, female   DOB: 1951-05-09, 63 y.o.   MRN: [id number]   Emily Clarke, is a 63 y.o. female  [id number]  [id number]:[id number]  DOB - 05-05-1951  Chief Complaint  Patient presents with  . Follow-up        Subjective:   Emily Clarke is a 63 y.o. female here today for a follow up visit. Patient has history of hypertension. Today for routine follow-up. She complains of cough that has been prolonged unnecessarily going to 2 weeks productive of yellowish sputum. No chest pain, no fever, no shortness of breath. Patient has No headache, No abdominal pain - No Nausea, No new weakness tingling or numbness.  Problem  Cough  Essential Hypertension, Benign    ALLERGIES: No Known Allergies  PAST MEDICAL HISTORY: Past Medical History  Diagnosis Date  . Tachycardia   . Hypertension   . Asthma   . SVT (supraventricular tachycardia)     MEDICATIONS AT HOME: Prior to Admission medications   Medication Sig Start Date End Date Taking? Authorizing Provider  aspirin EC 81 MG tablet Take 81 mg by mouth daily.   Yes Historical Provider, MD  metoprolol succinate (TOPROL-XL) 25 MG 24 hr tablet Take 1 tablet (25 mg total) by mouth daily. 02/19/14  Yes Lorayne Marek, MD  pantoprazole (PROTONIX) 40 MG tablet Take 1 tablet (40 mg total) by mouth daily. 02/19/14  Yes Lorayne Marek, MD  azithromycin (ZITHROMAX Z-PAK) 250 MG tablet Take 2 tablets today, then one tablet daily x 4 days 04/06/14   Tresa Garter, MD  guaiFENesin-dextromethorphan (ROBITUSSIN DM) 100-10 MG/5ML syrup Take 5 mLs by mouth every 4 (four) hours as needed for cough. 04/06/14   Tresa Garter, MD  ibuprofen (ADVIL,MOTRIN) 800 MG tablet Take 1 tablet (800 mg total) by mouth every 8 (eight) hours as needed. 11/01/13   Brent General, PA-C     Objective:   Filed Vitals:   04/06/14 1508 04/06/14 1520  BP: 163/99 155/97  Pulse: 65   Temp: 97.6 F (36.4 C)   TempSrc: Oral   Resp: 16   Height: 5'  4.5" (1.638 m)   Weight: 152 lb (68.947 kg)   SpO2: 97%     Exam General appearance : Awake, alert, not in any distress. Speech Clear. Not toxic looking HEENT: Atraumatic and Normocephalic, pupils equally reactive to light and accomodation Neck: supple, no JVD. No cervical lymphadenopathy.  Chest:Good air entry bilaterally, no added sounds  CVS: S1 S2 regular, no murmurs.  Abdomen: Bowel sounds present, Non tender and not distended with no gaurding, rigidity or rebound. Extremities: B/L Lower Ext shows no edema, both legs are warm to touch Neurology: Awake alert, and oriented X 3, CN II-XII intact, Non focal Skin:No Rash Wounds:N/A  Data Review Lab Results  Component Value Date   HGBA1C 6.1* 08/11/2013   HGBA1C 6.1* 10/11/2012     Assessment & Plan   1. Cough  - azithromycin (ZITHROMAX Z-PAK) 250 MG tablet; Take 2 tablets today, then one tablet daily x 4 days  Dispense: 6 each; Refill: 0 - guaiFENesin-dextromethorphan (ROBITUSSIN DM) 100-10 MG/5ML syrup; Take 5 mLs by mouth every 4 (four) hours as needed for cough.  Dispense: 480 mL; Refill: 0  2. Essential hypertension, benign  - We have discussed target BP range and blood pressure goal - I have advised patient to check BP regularly and to call us back or report to clinic if the numbers are consistently higher than 140/90  -  We discussed the importance of compliance with medical therapy and DASH diet recommended, consequences of uncontrolled hypertension discussed.  - continue current BP medications   Return in about 3 months (around 07/07/2014), or if symptoms worsen or fail to improve, for Follow up HTN.  The patient was given clear instructions to go to ER or return to medical center if symptoms don't improve, worsen or new problems develop. The patient verbalized understanding. The patient was told to call to get lab results if they haven't heard anything in the next week.   This note has been created with Biomedical engineer. Any transcriptional errors are unintentional.    Angelica Chessman, MD, [place], [place], [place] and [place], [place]   04/06/2014, 3:57 PM

## 2014-04-06 NOTE — Progress Notes (Signed)
Pt is here following up on her HTN. Pt states that she has had a prolonged cough for 2 weeks.

## 2014-04-07 ENCOUNTER — Ambulatory Visit: Payer: No Typology Code available for payment source

## 2014-04-10 IMAGING — CR DG THORACIC SPINE 3V
3 series · 3 of 3 positions shown · non-contrast
Comparison: 10/11/2012

CLINICAL DATA: Chronic right back pain.

THORACIC SPINE - 2 VIEW + SWIMMERS

[view not recorded (1 of 3)]
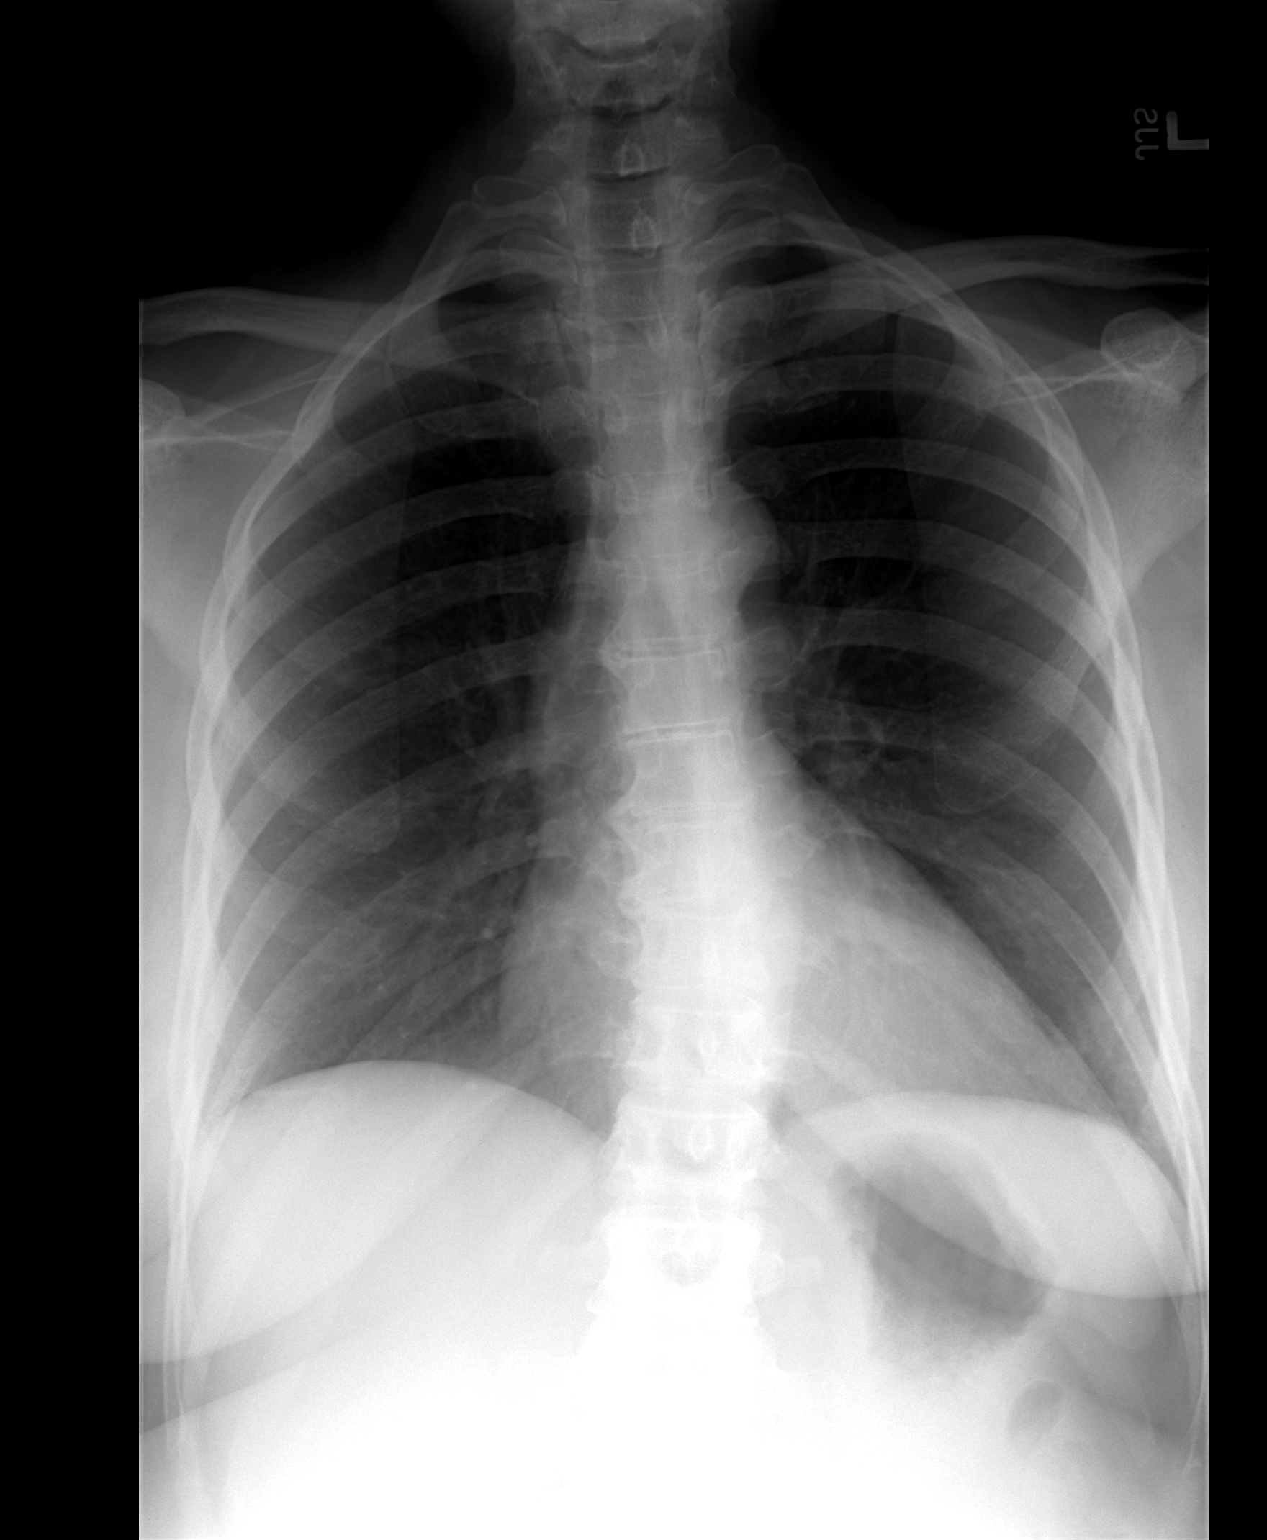

[view not recorded (2 of 3)]
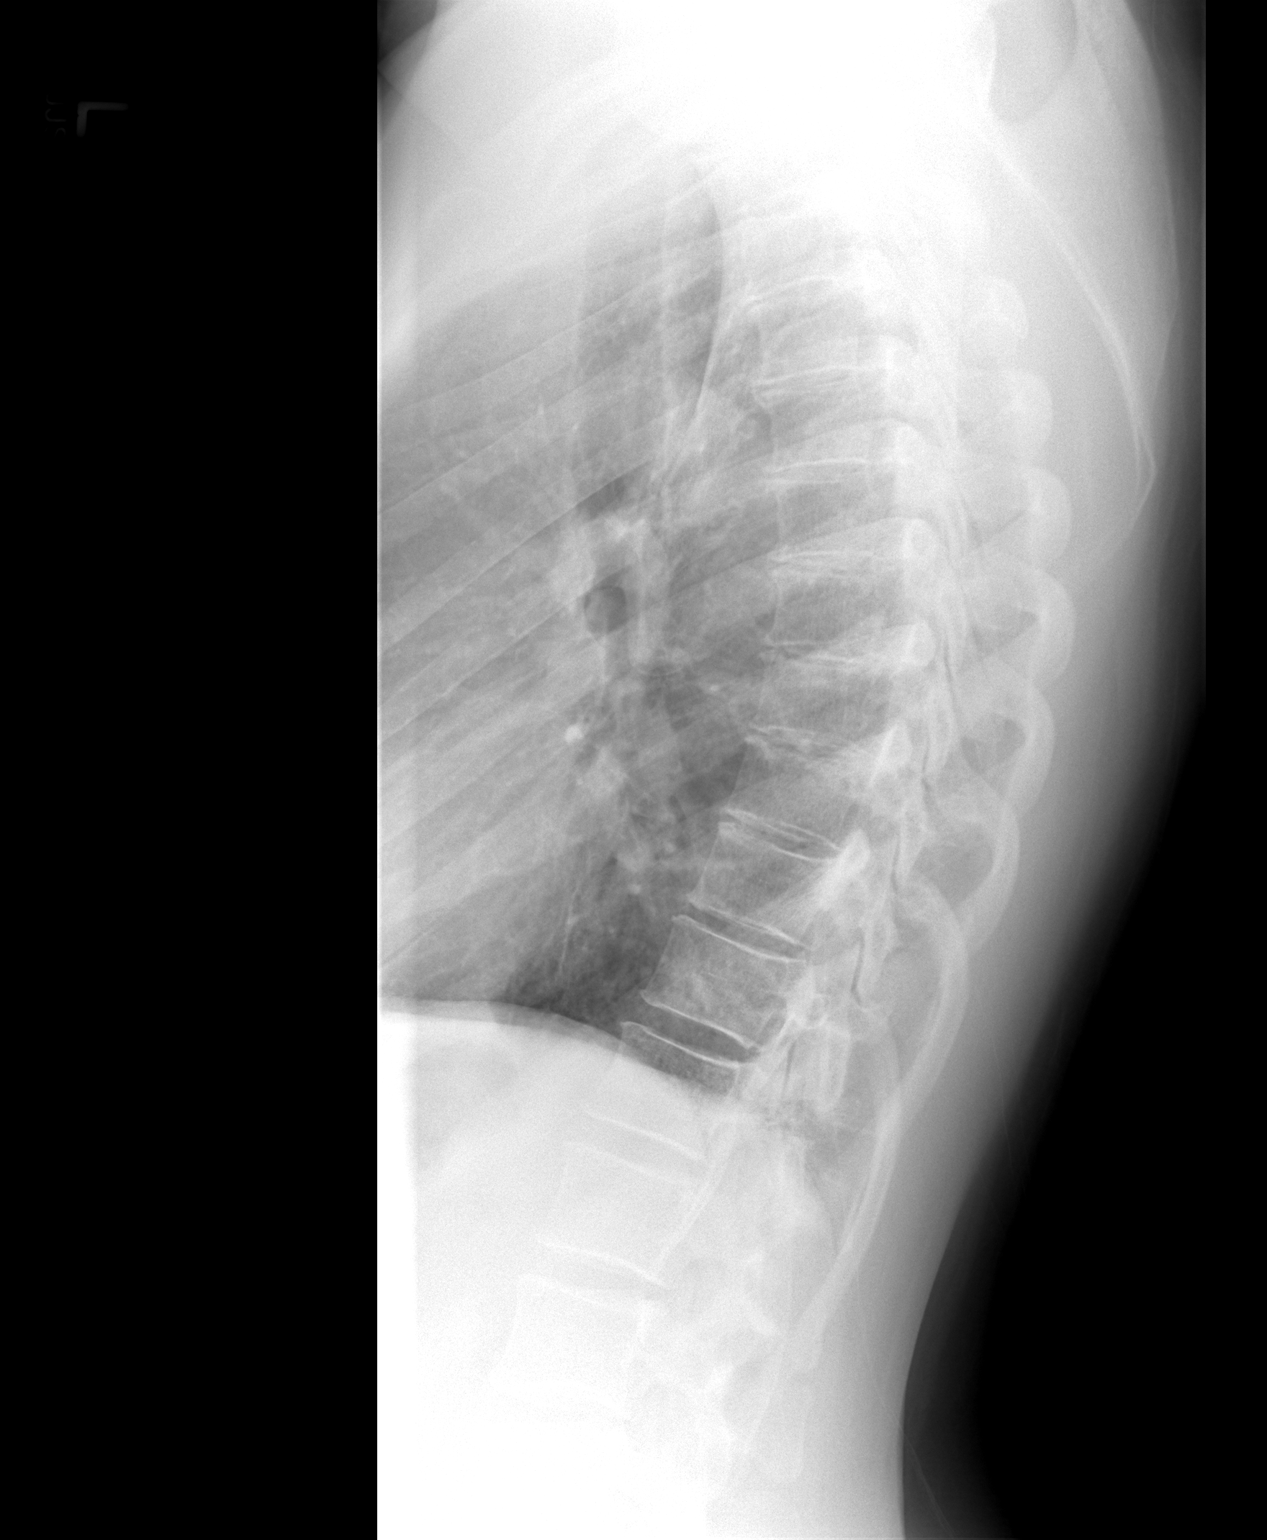

[view not recorded (3 of 3)]
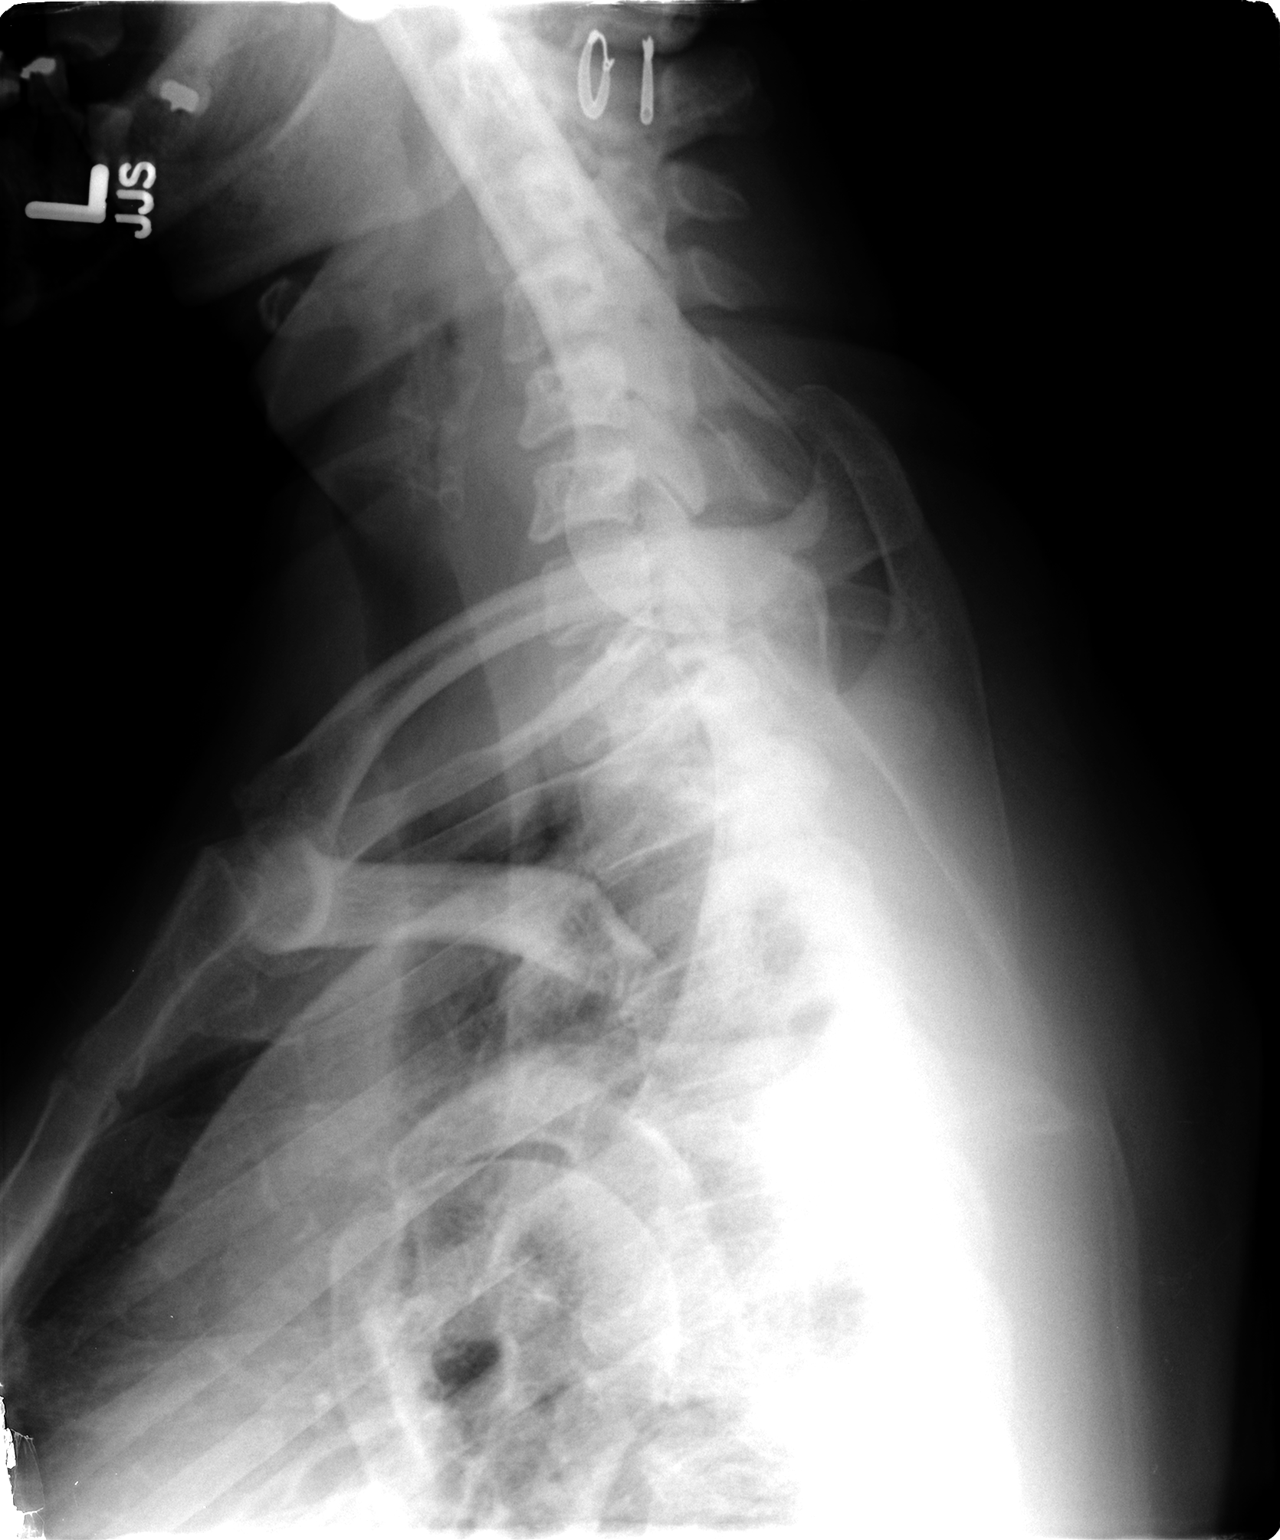

[3 of 3 positions shown; findings below may reference images not displayed]

FINDINGS: There is 9 degrees of levoconvex thoracic scoliosis as
measured between T1 and T11.  Thoracic spondylosis is present.

No thoracic spine fracture or subluxation is observed.
IMPRESSION: 1.  Mild to moderate thoracic spondylosis with mild levoconvex
thoracic scoliosis.

## 2014-04-15 ENCOUNTER — Ambulatory Visit: Payer: No Typology Code available for payment source | Attending: Internal Medicine | Admitting: Pharmacist

## 2014-04-15 VITALS — BP 145/88 | HR 83 | Temp 98.4°F | Resp 16

## 2014-04-15 DIAGNOSIS — I10 Essential (primary) hypertension: Secondary | ICD-10-CM

## 2014-04-15 NOTE — Progress Notes (Signed)
Patient here for follow up on her blood pressure Patient  Stated last visit her blood pressure was above 170 Today blood pressure is 145/88

## 2014-04-28 ENCOUNTER — Ambulatory Visit (HOSPITAL_COMMUNITY)
Admission: RE | Admit: 2014-04-28 | Discharge: 2014-04-28 | Disposition: A | Discharge status: Home / Self Care | Payer: No Typology Code available for payment source | Source: Ambulatory Visit | Attending: Obstetrics and Gynecology | Admitting: Obstetrics and Gynecology

## 2014-04-28 ENCOUNTER — Encounter (HOSPITAL_COMMUNITY): Payer: Self-pay

## 2014-04-28 VITALS — BP 118/84 | Ht 64.5 in | Wt 155.0 lb

## 2014-04-28 DIAGNOSIS — Z1231 Encounter for screening mammogram for malignant neoplasm of breast: Secondary | ICD-10-CM

## 2014-04-28 DIAGNOSIS — Z1239 Encounter for other screening for malignant neoplasm of breast: Secondary | ICD-10-CM

## 2014-04-28 NOTE — Progress Notes (Signed)
No complaints today.  Pap Smear: Pap smear completed today. Last Pap smear was 02/08/2013 at [facility] and normal. Per patient she has no history of an abnormal Pap smear. Patient has a history of a hysterectomy 29 years ago for a benign growth per patient. Patient does not need any further Pap smears due to her history of a hysterectomy for benign reasons per BCCCP and ACOG guidelines. Last Pap smear result is in EPIC.  Physical exam: Breasts Breasts symmetrical. No skin abnormalities bilateral breasts. No nipple retraction bilateral breasts. No nipple discharge bilateral breasts. No lymphadenopathy. No lumps palpated bilateral breasts. No complaints of pain or tenderness on exam. Patient escorted to mammography for a screening mammogram.        Pelvic/Bimanual No Pap smear completed today since patient has a history of a hysterectomy for benign reasons. Pap smear not indicated per BCCCP guidelines.

## 2014-04-28 NOTE — Patient Instructions (Signed)
Explained to Emily Clarke that she does not need any further Pap smears due to her history of a hysterectomy for benign reasons. Let [facility] know [facility] will [facility] her within the next couple weeks with results by letter or phone. Emily Clarke verbalized understanding. Patient escorted to mammography for a screening mammogram.

## 2014-05-04 ENCOUNTER — Encounter (HOSPITAL_COMMUNITY): Payer: Self-pay

## 2014-06-03 ENCOUNTER — Emergency Department (HOSPITAL_COMMUNITY): Payer: No Typology Code available for payment source

## 2014-06-03 ENCOUNTER — Emergency Department (HOSPITAL_COMMUNITY): Payer: Self-pay

## 2014-06-03 ENCOUNTER — Encounter (HOSPITAL_COMMUNITY): Payer: Self-pay | Admitting: Emergency Medicine

## 2014-06-03 ENCOUNTER — Emergency Department (HOSPITAL_COMMUNITY)
Admission: EM | Admit: 2014-06-03 | Discharge: 2014-06-03 | Disposition: A | Discharge status: Home / Self Care | Payer: No Typology Code available for payment source | Attending: Emergency Medicine | Admitting: Emergency Medicine

## 2014-06-03 DIAGNOSIS — I10 Essential (primary) hypertension: Secondary | ICD-10-CM | POA: Insufficient documentation

## 2014-06-03 DIAGNOSIS — G8929 Other chronic pain: Secondary | ICD-10-CM | POA: Insufficient documentation

## 2014-06-03 DIAGNOSIS — Z79899 Other long term (current) drug therapy: Secondary | ICD-10-CM | POA: Insufficient documentation

## 2014-06-03 DIAGNOSIS — R5383 Other fatigue: Secondary | ICD-10-CM | POA: Insufficient documentation

## 2014-06-03 DIAGNOSIS — R202 Paresthesia of skin: Secondary | ICD-10-CM | POA: Insufficient documentation

## 2014-06-03 DIAGNOSIS — J45909 Unspecified asthma, uncomplicated: Secondary | ICD-10-CM | POA: Insufficient documentation

## 2014-06-03 DIAGNOSIS — Z7982 Long term (current) use of aspirin: Secondary | ICD-10-CM | POA: Insufficient documentation

## 2014-06-03 DIAGNOSIS — Z8739 Personal history of other diseases of the musculoskeletal system and connective tissue: Secondary | ICD-10-CM | POA: Insufficient documentation

## 2014-06-03 DIAGNOSIS — R002 Palpitations: Secondary | ICD-10-CM | POA: Insufficient documentation

## 2014-06-03 HISTORY — DX: Unspecified rotator cuff tear or rupture of left shoulder, not specified as traumatic: M75.102

## 2014-06-03 HISTORY — DX: Headache, unspecified: R51.9

## 2014-06-03 HISTORY — DX: Dorsalgia, unspecified: M54.9

## 2014-06-03 HISTORY — DX: Other chronic pain: G89.29

## 2014-06-03 HISTORY — DX: Headache: R51

## 2014-06-03 LAB — CBC WITH DIFFERENTIAL/PLATELET
Basophils Absolute: 0 10*3/uL (ref 0.0–0.1)
Basophils Relative: 0 % (ref 0–1)
Eosinophils Absolute: 0.1 10*3/uL (ref 0.0–0.7)
Eosinophils Relative: 2 % (ref 0–5)
HCT: 35.4 % — ABNORMAL LOW (ref 36.0–46.0)
Hemoglobin: 12.2 g/dL (ref 12.0–15.0)
Lymphocytes Relative: 28 % (ref 12–46)
Lymphs Abs: 2.2 10*3/uL (ref 0.7–4.0)
MCH: 29.8 pg (ref 26.0–34.0)
MCHC: 34.5 g/dL (ref 30.0–36.0)
MCV: 86.6 fL (ref 78.0–100.0)
Monocytes Absolute: 0.6 10*3/uL (ref 0.1–1.0)
Monocytes Relative: 8 % (ref 3–12)
Neutro Abs: 4.8 10*3/uL (ref 1.7–7.7)
Neutrophils Relative %: 62 % (ref 43–77)
Platelets: 245 10*3/uL (ref 150–400)
RBC: 4.09 MIL/uL (ref 3.87–5.11)
RDW: 13.4 % (ref 11.5–15.5)
WBC: 7.7 10*3/uL (ref 4.0–10.5)

## 2014-06-03 LAB — URINALYSIS, ROUTINE W REFLEX MICROSCOPIC
Bilirubin Urine: NEGATIVE
Glucose, UA: NEGATIVE mg/dL
Ketones, ur: NEGATIVE mg/dL
Leukocytes, UA: NEGATIVE
Nitrite: NEGATIVE
Protein, ur: NEGATIVE mg/dL
Specific Gravity, Urine: 1.024 (ref 1.005–1.030)
Urobilinogen, UA: 0.2 mg/dL (ref 0.0–1.0)
pH: 6 (ref 5.0–8.0)

## 2014-06-03 LAB — URINE MICROSCOPIC-ADD ON

## 2014-06-03 LAB — BASIC METABOLIC PANEL
Anion gap: 13 (ref 5–15)
BUN: 10 mg/dL (ref 6–23)
CO2: 30 mEq/L (ref 19–32)
Calcium: 9.9 mg/dL (ref 8.4–10.5)
Chloride: 103 mEq/L (ref 96–112)
Creatinine, Ser: 0.74 mg/dL (ref 0.50–1.10)
GFR calc Af Amer: >90 mL/min (ref >90)
GFR calc non Af Amer: 90 mL/min — ABNORMAL LOW (ref >90)
Glucose, Bld: 88 mg/dL (ref 70–99)
Potassium: 3.5 mEq/L — ABNORMAL LOW (ref 3.7–5.3)
Sodium: 146 mEq/L (ref 137–147)

## 2014-06-03 LAB — TROPONIN I: Troponin I: <0.3 ng/mL (ref <0.30)

## 2014-06-03 MED ORDER — IBUPROFEN 200 MG PO TABS
400.0000 mg | ORAL_TABLET | Freq: Once | ORAL | Status: AC
  Administered 2014-06-03: 400 mg via ORAL
  Filled 2014-06-03: qty 2

## 2014-06-03 MED ORDER — ACETAMINOPHEN 325 MG PO TABS
650.0000 mg | ORAL_TABLET | Freq: Once | ORAL | Status: AC
  Administered 2014-06-03: 650 mg via ORAL
  Filled 2014-06-03: qty 2

## 2014-06-03 NOTE — ED Notes (Signed)
Pt is transported back from MRI.  Family at bedside.  Pt awaiting for MRI result.

## 2014-06-03 NOTE — Discharge Instructions (Signed)

## 2014-06-03 NOTE — ED Notes (Signed)
Patient transported to MRI 

## 2014-06-03 NOTE — Progress Notes (Signed)
CSW attempted to meet with Pt at bedside. However, the Pt's daughter informed CSW that she had just been transported for an MRI. Per daughter, the pt went to a local [facility] and had her blood pressure checked. The staff at the pharmacy advised her to come to the ED.   Pt's daughter informed CSW that she lives at home and is able to do her own ADL's.   Daughter/ Lowella Dell [phone] Omelia Blackwater [phone] ED CSW 06/03/2014 7:50 PM

## 2014-06-03 NOTE — ED Notes (Signed)
Pt states that she was at [facility] and checked her BP.  It was 169/100 and [facility] told her to come to the ER.  C/o headache.

## 2014-06-03 NOTE — ED Provider Notes (Signed)
CSN: [id number]     Arrival date & time 06/03/14  1304 History   First MD Initiated Contact with Patient 06/03/14 1338     Chief Complaint  Patient presents with  . Hypertension  . Palpitations  . Fatigue  . Numbness     HPI Pt was seen at 1400. Per pt, c/o gradual onset and persistence of constant "I don't feel well" since 0600 this morning. Pt further describes her symptoms as feeling "palpitations," "lightheadedness," generalized dull "aching" headache, as well as various muscles of her body "having spasms." Pt states she went to a local pharmacy and checked her BP. States her BP read "169/100" so she came to the ED for evaluation. Pt states when she arrived to the ED, she felt her left palmar hand "go numb." Denies headache was sudden or maximal at onset or any any time. Denies falls, no fevers, no neck pain, no back pain, no CP/SOB, no cough, no abd pain, no N/V/D, no focal motor weakness, no ataxia, no syncope, no slurred speech, no facial droop, no visual changes.    Past Medical History  Diagnosis Date  . Tachycardia   . Hypertension   . Asthma   . SVT (supraventricular tachycardia)   . Headache   . Chronic back pain   . Rotator cuff syndrome of left shoulder    Past Surgical History  Procedure Laterality Date  . Abdominal hysterectomy    . Cholecystectomy    . Tubal ligation     Family History  Problem Relation Age of Onset  . Heart disease Mother   . Prostate cancer Father   . Colon polyps Father   . Heart disease Father   . Hyperlipidemia Father   . Hyperlipidemia Mother   . Heart attack Mother   . Heart attack Father    History  Substance Use Topics  . Smoking status: Never Smoker   . Smokeless tobacco: Never Used  . Alcohol Use: No   OB History    Gravida Para Term Preterm AB TAB SAB Ectopic Multiple Living   3 3 3       2      Review of Systems ROS: Statement: All systems negative except as marked or noted in the HPI; Constitutional: Negative for  fever and chills. ; ; Eyes: Negative for eye pain, redness and discharge. ; ; ENMT: Negative for ear pain, hoarseness, nasal congestion, sinus pressure and sore throat. ; ; Cardiovascular: +palpitations. Negative for chest pain, diaphoresis, dyspnea and peripheral edema. ; ; Respiratory: Negative for cough, wheezing and stridor. ; ; Gastrointestinal: Negative for nausea, vomiting, diarrhea, abdominal pain, blood in stool, hematemesis, jaundice and rectal bleeding.; ; Genitourinary: Negative for dysuria, flank pain and hematuria. ; ; Musculoskeletal: +muscle "cramping." Negative for back pain and neck pain. Negative for swelling and trauma.; ; Skin: Negative for pruritus, rash, abrasions, blisters, bruising and skin lesion.; ; Neuro: +paresthesias, lightheadedness, headache. Negative for neck stiffness. Negative for weakness, altered level of consciousness , altered mental status, extremity weakness, involuntary movement, seizure and syncope.     Allergies  Review of patient's allergies indicates no known allergies.  Home Medications   Prior to Admission medications   Medication Sig Start Date End Date Taking? Authorizing Provider  aspirin EC 81 MG tablet Take 81 mg by mouth daily.   Yes Historical Provider, MD  guaiFENesin-dextromethorphan (ROBITUSSIN DM) 100-10 MG/5ML syrup Take 5 mLs by mouth every 4 (four) hours as needed for cough. 04/06/14  Yes Olugbemiga  Essie Christine, MD  ibuprofen (ADVIL,MOTRIN) 800 MG tablet Take 1 tablet (800 mg total) by mouth every 8 (eight) hours as needed. 11/01/13  Yes Resa Miner Lawyer, PA-C  metoprolol succinate (TOPROL-XL) 25 MG 24 hr tablet Take 1 tablet (25 mg total) by mouth daily. 02/19/14  Yes Lorayne Marek, MD  pantoprazole (PROTONIX) 40 MG tablet Take 1 tablet (40 mg total) by mouth daily. 02/19/14  Yes Lorayne Marek, MD  azithromycin (ZITHROMAX Z-PAK) 250 MG tablet Take 2 tablets today, then one tablet daily x 4 days Patient not taking: Reported on 06/03/2014  04/06/14   Tresa Garter, MD   BP 149/78 mmHg  Pulse 59  Temp(Src) 98.7 F (37.1 C) (Oral)  Resp 23  SpO2 94% Physical Exam  1405: Physical examination:  Nursing notes reviewed; Vital signs and O2 SAT reviewed;  Constitutional: Well developed, Well nourished, Well hydrated, In no acute distress; Head:  Normocephalic, atraumatic; Eyes: EOMI, PERRL, No scleral icterus; ENMT: Mouth and pharynx normal, Mucous membranes moist; Neck: Supple, Full range of motion, No lymphadenopathy. No meningeal signs; Cardiovascular: Regular rate and rhythm, No gallop; Respiratory: Breath sounds clear & equal bilaterally, No wheezes.  Speaking full sentences with ease, Normal respiratory effort/excursion; Chest: Nontender, Movement normal; Abdomen: Soft, Nontender, Nondistended, Normal bowel sounds; Genitourinary: No CVA tenderness; Spine:  No midline CS, TS, LS tenderness. +TTP left hypertonic thoracic paraspinal muscles.;; Extremities: Pulses normal, No tenderness, No edema, No calf edema or asymmetry.  Motor strength at bilateral shoulders normal.  Sensation intact over deltoid regions, distal NMS intact with bilateral hands having intact and equal strength in the distribution of the median, radial, and ulnar nerve function compared to opposite side. Strong radial pulse..; Neuro: AA&Ox3, Major CN grossly intact.Speech clear.  No facial droop.  No nystagmus. Grips equal. Strength 5/5 equal bilat UE's and LE's.  DTR 2/4 equal bilat UE's and LE's. +left medial palmar subjective decreased sensation, otherwise gross sensory deficits.  Normal cerebellar testing bilat UE's (finger-nose) and LE's (heel-shin).  Climbs on and off stretcher easily by herself. Gait steady.; Skin: Color normal, Warm, Dry.   ED Course  Procedures    EKG Interpretation   Date/Time:  Wednesday June 03 2014 14:35:29 EST Ventricular Rate:  53 PR Interval:  209 QRS Duration: 84 QT Interval:  435 QTC Calculation: 408 R Axis:   51 Text  Interpretation:  Sinus rhythm When compared with ECG of 11/01/2013 No  significant change was found Confirmed by [facility]  MD, Nunzio Cory [phone])  on 06/03/2014 2:54:14 PM      MDM  MDM Reviewed: previous chart, nursing note and vitals Reviewed previous: CT scan, labs and ECG Interpretation: labs, ECG and x-ray     Results for orders placed or performed during the hospital encounter of 06/03/14  Urinalysis, Routine w reflex microscopic  Result Value Ref Range   Color, Urine YELLOW YELLOW   APPearance CLEAR CLEAR   Specific Gravity, Urine 1.024 1.005 - 1.030   pH 6.0 5.0 - 8.0   Glucose, UA NEGATIVE NEGATIVE mg/dL   Hgb urine dipstick SMALL (A) NEGATIVE   Bilirubin Urine NEGATIVE NEGATIVE   Ketones, ur NEGATIVE NEGATIVE mg/dL   Protein, ur NEGATIVE NEGATIVE mg/dL   Urobilinogen, UA 0.2 0.0 - 1.0 mg/dL   Nitrite NEGATIVE NEGATIVE   Leukocytes, UA NEGATIVE NEGATIVE  CBC with Differential  Result Value Ref Range   WBC 7.7 4.0 - 10.5 K/uL   RBC 4.09 3.87 - 5.11 MIL/uL   Hemoglobin 12.2 12.0 -  15.0 g/dL   HCT 35.4 (L) 36.0 - 46.0 %   MCV 86.6 78.0 - 100.0 fL   MCH 29.8 26.0 - 34.0 pg   MCHC 34.5 30.0 - 36.0 g/dL   RDW 13.4 11.5 - 15.5 %   Platelets 245 150 - 400 K/uL   Neutrophils Relative % 62 43 - 77 %   Neutro Abs 4.8 1.7 - 7.7 K/uL   Lymphocytes Relative 28 12 - 46 %   Lymphs Abs 2.2 0.7 - 4.0 K/uL   Monocytes Relative 8 3 - 12 %   Monocytes Absolute 0.6 0.1 - 1.0 K/uL   Eosinophils Relative 2 0 - 5 %   Eosinophils Absolute 0.1 0.0 - 0.7 K/uL   Basophils Relative 0 0 - 1 %   Basophils Absolute 0.0 0.0 - 0.1 K/uL  Basic metabolic panel  Result Value Ref Range   Sodium 146 137 - 147 mEq/L   Potassium 3.5 (L) 3.7 - 5.3 mEq/L   Chloride 103 96 - 112 mEq/L   CO2 30 19 - 32 mEq/L   Glucose, Bld 88 70 - 99 mg/dL   BUN 10 6 - 23 mg/dL   Creatinine, Ser 0.74 0.50 - 1.10 mg/dL   Calcium 9.9 8.4 - 10.5 mg/dL   GFR calc non Af Amer 90 (L) >90 mL/min   GFR calc Af Amer >90  >90 mL/min   Anion gap 13 5 - 15  Troponin I  Result Value Ref Range   Troponin I <0.30 <0.30 ng/mL  Urine microscopic-add on  Result Value Ref Range   Squamous Epithelial / LPF RARE RARE   WBC, UA 0-2 <3 WBC/hpf   RBC / HPF 3-6 <3 RBC/hpf   Bacteria, UA RARE RARE   Urine-Other MUCOUS PRESENT    Dg Chest 2 View 06/03/2014   CLINICAL DATA:  Palpitations.  Chest pain.  EXAM: CHEST  2 VIEW  COMPARISON:  Nov 01, 2013.  FINDINGS: The heart size and mediastinal contours are within normal limits. Both lungs are clear. No pneumothorax or pleural effusion is noted. The visualized skeletal structures are unremarkable.  IMPRESSION: No acute cardiopulmonary abnormality seen.   Electronically Signed   By: Sabino Dick M.D.   On: 06/03/2014 15:04    1620:  Pt with multiple complaints. Pt is not orthostatic on VS. Labs, UA, CXR and EKG are all reassuring. MRI brain pending. Sign out to Dr. Johnney Killian.   Francine Graven, DO 06/03/14 [phone]

## 2014-06-03 NOTE — ED Provider Notes (Signed)
The patient's MRI results are reviewed and have no acute findings, CVA is ruled out, micro-ischemic vascular disease is potentially identified.. I have reassessed the patient and find her to be in good condition. She is alert with clear mental status. Her vital signs have been stable. The patient had some Limited paresthesia with no motor dysfunction or pain. At this time she will be discharged to follow-up with her family physician for further evaluation and diagnostic testing as needed based on her MRI results.  Charlesetta Shanks, MD 06/03/14 [phone]

## 2014-06-04 LAB — URINE CULTURE
Colony Count: NO GROWTH
Culture: NO GROWTH

## 2014-06-29 ENCOUNTER — Ambulatory Visit: Payer: No Typology Code available for payment source | Attending: Internal Medicine | Admitting: Internal Medicine

## 2014-06-29 ENCOUNTER — Encounter: Payer: Self-pay | Admitting: Internal Medicine

## 2014-06-29 VITALS — BP 149/91 | HR 60 | Temp 97.9°F | Resp 18 | Ht 64.0 in | Wt 151.0 lb

## 2014-06-29 DIAGNOSIS — J45909 Unspecified asthma, uncomplicated: Secondary | ICD-10-CM | POA: Insufficient documentation

## 2014-06-29 DIAGNOSIS — Z7982 Long term (current) use of aspirin: Secondary | ICD-10-CM | POA: Insufficient documentation

## 2014-06-29 DIAGNOSIS — G8929 Other chronic pain: Secondary | ICD-10-CM | POA: Insufficient documentation

## 2014-06-29 DIAGNOSIS — M549 Dorsalgia, unspecified: Secondary | ICD-10-CM | POA: Insufficient documentation

## 2014-06-29 DIAGNOSIS — K2100 Gastro-esophageal reflux disease with esophagitis, without bleeding: Secondary | ICD-10-CM

## 2014-06-29 DIAGNOSIS — I10 Essential (primary) hypertension: Secondary | ICD-10-CM

## 2014-06-29 DIAGNOSIS — Z79899 Other long term (current) drug therapy: Secondary | ICD-10-CM | POA: Insufficient documentation

## 2014-06-29 DIAGNOSIS — I471 Supraventricular tachycardia: Secondary | ICD-10-CM | POA: Insufficient documentation

## 2014-06-29 LAB — LIPID PANEL
Cholesterol: 174 mg/dL (ref 0–200)
HDL: 31 mg/dL — ABNORMAL LOW (ref >39)
LDL Cholesterol: 95 mg/dL (ref 0–99)
Total CHOL/HDL Ratio: 5.6 Ratio
Triglycerides: 241 mg/dL — ABNORMAL HIGH (ref <150)
VLDL: 48 mg/dL — ABNORMAL HIGH (ref 0–40)

## 2014-06-29 LAB — POCT GLYCOSYLATED HEMOGLOBIN (HGB A1C): Hemoglobin A1C: 5.8

## 2014-06-29 MED ORDER — HYDROCHLOROTHIAZIDE 12.5 MG PO TABS: 12.5000 mg | ORAL_TABLET | Freq: Every day | ORAL | Status: DC

## 2014-06-29 MED ORDER — METOPROLOL SUCCINATE ER 25 MG PO TB24: 25.0000 mg | ORAL_TABLET | Freq: Every day | ORAL | Status: DC

## 2014-06-29 MED ORDER — PANTOPRAZOLE SODIUM 40 MG PO TBEC: 40.0000 mg | DELAYED_RELEASE_TABLET | Freq: Every day | ORAL | Status: DC

## 2014-06-29 NOTE — Patient Instructions (Signed)
DASH Eating Plan DASH stands for "Dietary Approaches to Stop Hypertension." The DASH eating plan is a healthy eating plan that has been shown to reduce high blood pressure (hypertension). Additional health benefits may include reducing the risk of type 2 diabetes mellitus, heart disease, and stroke. The DASH eating plan may also help with weight loss. WHAT DO I NEED TO KNOW ABOUT THE DASH EATING PLAN? For the DASH eating plan, you will follow these general guidelines:  Choose foods with a percent daily value for sodium of less than 5% (as listed on the food label).  Use salt-free seasonings or herbs instead of table salt or sea salt.  Check with your health care provider or pharmacist before using salt substitutes.  Eat lower-sodium products, often labeled as "lower sodium" or "no salt added."  Eat fresh foods.  Eat more vegetables, fruits, and low-fat dairy products.  Choose whole grains. Look for the word "whole" as the first word in the ingredient list.  Choose fish and skinless chicken or turkey more often than red meat. Limit fish, poultry, and meat to 6 oz (170 g) each day.  Limit sweets, desserts, sugars, and sugary drinks.  Choose heart-healthy fats.  Limit cheese to 1 oz (28 g) per day.  Eat more home-cooked food and less restaurant, buffet, and fast food.  Limit fried foods.  Cook foods using methods other than frying.  Limit canned vegetables. If you do use them, rinse them well to decrease the sodium.  When eating at a restaurant, ask that your food be prepared with less salt, or no salt if possible. WHAT FOODS CAN I EAT? Seek help from a dietitian for individual calorie needs. Grains Whole grain or whole wheat bread. Brown rice. Whole grain or whole wheat pasta. Quinoa, bulgur, and whole grain cereals. Low-sodium cereals. Corn or whole wheat flour tortillas. Whole grain cornbread. Whole grain crackers. Low-sodium crackers. Vegetables Fresh or frozen vegetables  (raw, steamed, roasted, or grilled). Low-sodium or reduced-sodium tomato and vegetable juices. Low-sodium or reduced-sodium tomato sauce and paste. Low-sodium or reduced-sodium canned vegetables.  Fruits All fresh, canned (in natural juice), or frozen fruits. Meat and Other Protein Products Ground beef (85% or leaner), grass-fed beef, or beef trimmed of fat. Skinless chicken or turkey. Ground chicken or turkey. Pork trimmed of fat. All fish and seafood. Eggs. Dried beans, peas, or lentils. Unsalted nuts and seeds. Unsalted canned beans. Dairy Low-fat dairy products, such as skim or 1% milk, 2% or reduced-fat cheeses, low-fat ricotta or cottage cheese, or plain low-fat yogurt. Low-sodium or reduced-sodium cheeses. Fats and Oils Tub margarines without trans fats. Light or reduced-fat mayonnaise and salad dressings (reduced sodium). Avocado. Safflower, olive, or canola oils. Natural peanut or almond butter. Other Unsalted popcorn and pretzels. The items listed above may not be a complete list of recommended foods or beverages. Contact your dietitian for more options. WHAT FOODS ARE NOT RECOMMENDED? Grains White bread. White pasta. White rice. Refined cornbread. Bagels and croissants. Crackers that contain trans fat. Vegetables Creamed or fried vegetables. Vegetables in a cheese sauce. Regular canned vegetables. Regular canned tomato sauce and paste. Regular tomato and vegetable juices. Fruits Dried fruits. Canned fruit in light or heavy syrup. Fruit juice. Meat and Other Protein Products Fatty cuts of meat. Ribs, chicken wings, bacon, sausage, bologna, salami, chitterlings, fatback, hot dogs, bratwurst, and packaged luncheon meats. Salted nuts and seeds. Canned beans with salt. Dairy Whole or 2% milk, cream, half-and-half, and cream cheese. Whole-fat or sweetened yogurt. Full-fat   cheeses or blue cheese. Nondairy creamers and whipped toppings. Processed cheese, cheese spreads, or cheese  curds. Condiments Onion and garlic salt, seasoned salt, table salt, and sea salt. Canned and packaged gravies. Worcestershire sauce. Tartar sauce. Barbecue sauce. Teriyaki sauce. Soy sauce, including reduced sodium. Steak sauce. Fish sauce. Oyster sauce. Cocktail sauce. Horseradish. Ketchup and mustard. Meat flavorings and tenderizers. Bouillon cubes. Hot sauce. Tabasco sauce. Marinades. Taco seasonings. Relishes. Fats and Oils Butter, stick margarine, lard, shortening, ghee, and bacon fat. Coconut, palm kernel, or palm oils. Regular salad dressings. Other Pickles and olives. Salted popcorn and pretzels. The items listed above may not be a complete list of foods and beverages to avoid. Contact your dietitian for more information. WHERE CAN I FIND MORE INFORMATION? National Heart, Lung, and Blood Institute: www.nhlbi.nih.gov/health/health-topics/topics/dash/ Document Released: 06/08/2011 Document Revised: 11/03/2013 Document Reviewed: 04/23/2013 ExitCare Patient Information 2015 [employer]. This information is not intended to replace advice given to you by your health care provider. Make sure you discuss any questions you have with your health care provider. Hypertension Hypertension, commonly called high blood pressure, is when the force of blood pumping through your arteries is too strong. Your arteries are the blood vessels that carry blood from your heart throughout your body. A blood pressure reading consists of a higher number over a lower number, such as 110/72. The higher number (systolic) is the pressure inside your arteries when your heart pumps. The lower number (diastolic) is the pressure inside your arteries when your heart relaxes. Ideally you want your blood pressure below 120/80. Hypertension forces your heart to work harder to pump blood. Your arteries may become narrow or stiff. Having hypertension puts you at risk for heart disease, stroke, and other problems.  RISK  FACTORS Some risk factors for high blood pressure are controllable. Others are not.  Risk factors you cannot control include:   Race. You may be at higher risk if you are African American.  Age. Risk increases with age.  Gender. Men are at higher risk than women before age 45 years. After age 65, women are at higher risk than men. Risk factors you can control include:  Not getting enough exercise or physical activity.  Being overweight.  Getting too much fat, sugar, calories, or salt in your diet.  Drinking too much alcohol. SIGNS AND SYMPTOMS Hypertension does not usually cause signs or symptoms. Extremely high blood pressure (hypertensive crisis) may cause headache, anxiety, shortness of breath, and nosebleed. DIAGNOSIS  To check if you have hypertension, your health care provider will measure your blood pressure while you are seated, with your arm held at the level of your heart. It should be measured at least twice using the same arm. Certain conditions can cause a difference in blood pressure between your right and left arms. A blood pressure reading that is higher than normal on one occasion does not mean that you need treatment. If one blood pressure reading is high, ask your health care provider about having it checked again. TREATMENT  Treating high blood pressure includes making lifestyle changes and possibly taking medicine. Living a healthy lifestyle can help lower high blood pressure. You may need to change some of your habits. Lifestyle changes may include:  Following the DASH diet. This diet is high in fruits, vegetables, and whole grains. It is low in salt, red meat, and added sugars.  Getting at least 2 hours of brisk physical activity every week.  Losing weight if necessary.  Not smoking.  Limiting   alcoholic beverages.  Learning ways to reduce stress. If lifestyle changes are not enough to get your blood pressure under control, your health care provider may  prescribe medicine. You may need to take more than one. Work closely with your health care provider to understand the risks and benefits. HOME CARE INSTRUCTIONS  Have your blood pressure rechecked as directed by your health care provider.   Take medicines only as directed by your health care provider. Follow the directions carefully. Blood pressure medicines must be taken as prescribed. The medicine does not work as well when you skip doses. Skipping doses also puts you at risk for problems.   Do not smoke.   Monitor your blood pressure at home as directed by your health care provider. SEEK MEDICAL CARE IF:   You think you are having a reaction to medicines taken.  You have recurrent headaches or feel dizzy.  You have swelling in your ankles.  You have trouble with your vision. SEEK IMMEDIATE MEDICAL CARE IF:  You develop a severe headache or confusion.  You have unusual weakness, numbness, or feel faint.  You have severe chest or abdominal pain.  You vomit repeatedly.  You have trouble breathing. MAKE SURE YOU:   Understand these instructions.  Will watch your condition.  Will get help right away if you are not doing well or get worse. Document Released: 06/19/2005 Document Revised: 11/03/2013 Document Reviewed: 04/11/2013 ExitCare Patient Information 2015 [employer]. This information is not intended to replace advice given to you by your health care provider. Make sure you discuss any questions you have with your health care provider.  

## 2014-06-29 NOTE — Progress Notes (Signed)
Pt here to establish care per HFU- Elevated blood pressure seen in [facility] ED for heart palpitations States she is feeling better with taking prescribed medications Denies chest pain or sob C/o intermit middle of palm tingling in both hands/feet x 2 weeks No medical Hx. Diabetes Up to date on health maintenance BP- 149/91 60 c/o freq headaches

## 2014-06-29 NOTE — Progress Notes (Signed)
Patient ID: Emily Clarke, female   DOB: 1951/06/21, 63 y.o.   MRN: [id number]   Emily Clarke, is a 63 y.o. female  WUJ:[id number]  [id number]  DOB - [birth year]  Chief Complaint  Patient presents with  . Hospitalization Follow-up  . Hypertension  . Establish Care        Subjective:   Emily Clarke is a 63 y.o. female here today for a follow up visit. Patient is known to have hypertension, supraventricular tachycardia, and chronic back pain. Today for routine follow-up. Patient was seen in the ED recently for feelings of palpitation and lightheadedness, and her blood pressure was found to be 169/100 mmHg. She is doing very well today. She has no complaints. Her blood pressure is suboptimal. Her only medication is metoprolol XL 25 mg tablet by mouth daily. She has no cough, no chest pain, no abdominal pain, no nausea no vomiting no diarrhea, no focal weakness, no slurred speechvisual changes, No headache, No new weakness tingling or numbness.  No problems updated.  ALLERGIES: No Known Allergies  PAST MEDICAL HISTORY: Past Medical History  Diagnosis Date  . Tachycardia   . Hypertension   . Asthma   . SVT (supraventricular tachycardia)   . Headache   . Chronic back pain   . Rotator cuff syndrome of left shoulder     MEDICATIONS AT HOME: Prior to Admission medications   Medication Sig Start Date End Date Taking? Authorizing Provider  aspirin EC 81 MG tablet Take 81 mg by mouth daily.   Yes Historical Provider, MD  metoprolol succinate (TOPROL-XL) 25 MG 24 hr tablet Take 1 tablet (25 mg total) by mouth daily. 06/29/14  Yes Tresa Garter, MD  pantoprazole (PROTONIX) 40 MG tablet Take 1 tablet (40 mg total) by mouth daily. 06/29/14  Yes Tresa Garter, MD  hydrochlorothiazide (HYDRODIURIL) 12.5 MG tablet Take 1 tablet (12.5 mg total) by mouth daily. 06/29/14   Tresa Garter, MD     Objective:   Filed Vitals:   06/29/14 1433  BP: 149/91  Pulse: 60    Temp: 97.9 F (36.6 C)  TempSrc: Oral  Resp: 18  Height: 5\' 4"  (1.626 m)  Weight: 151 lb (68.493 kg)  SpO2: 97%    Exam General appearance : Awake, alert, not in any distress. Speech Clear. Not toxic looking HEENT: Atraumatic and Normocephalic, pupils equally reactive to light and accomodation Neck: supple, no JVD. No cervical lymphadenopathy.  Chest:Good air entry bilaterally, no added sounds  CVS: S1 S2 regular, no murmurs.  Abdomen: Bowel sounds present, Non tender and not distended with no gaurding, rigidity or rebound. Extremities: B/L Lower Ext shows no edema, both legs are warm to touch Neurology: Awake alert, and oriented X 3, CN II-XII intact, Non focal Skin:No Rash Wounds:N/A  Data Review Lab Results  Component Value Date   HGBA1C 6.1* 08/11/2013   HGBA1C 6.1* 10/11/2012     Assessment & Plan   1. Gastroesophageal reflux disease with esophagitis  - pantoprazole (PROTONIX) 40 MG tablet; Take 1 tablet (40 mg total) by mouth daily.  Dispense: 90 tablet; Refill: 3 - POCT glycosylated hemoglobin (Hb A1C) - Lipid panel  2. Essential hypertension, benign Patient's blood pressure is suboptimal, heart rate is already dilated left minimum, will add small dose of diuretic to control blood pressure.  - metoprolol succinate (TOPROL-XL) 25 MG 24 hr tablet; Take 1 tablet (25 mg total) by mouth daily.  Dispense: 90 tablet; Refill: 3  - Add -  hydrochlorothiazide (HYDRODIURIL) 12.5 MG tablet; Take 1 tablet (12.5 mg total) by mouth daily.  Dispense: 90 tablet; Refill: 3  - Patient was counseled extensively about nutrition and exercise  - We discussed blood pressure goal, patient is to record her blood pressure and bring to clinic during next visit - DASH Diet emphasized   Return in about 3 months (around 09/28/2014) for Follow up HTN.  The patient was given clear instructions to go to ER or return to medical center if symptoms don't improve, worsen or new problems develop.  The patient verbalized understanding. The patient was told to call to get lab results if they haven't heard anything in the next week.   This note has been created with Surveyor, quantity. Any transcriptional errors are unintentional.    Angelica Chessman, MD, [place], [place], [place] and [place], [place]   06/29/2014, 3:14 PM

## 2014-07-17 ENCOUNTER — Ambulatory Visit: Payer: No Typology Code available for payment source | Attending: Internal Medicine | Admitting: *Deleted

## 2014-07-17 VITALS — BP 110/72 | HR 72 | Temp 98.2°F | Resp 20

## 2014-07-17 DIAGNOSIS — I10 Essential (primary) hypertension: Secondary | ICD-10-CM | POA: Insufficient documentation

## 2014-07-17 DIAGNOSIS — R7309 Other abnormal glucose: Secondary | ICD-10-CM | POA: Insufficient documentation

## 2014-07-17 NOTE — Patient Instructions (Signed)
DASH Eating Plan °DASH stands for "Dietary Approaches to Stop Hypertension." The DASH eating plan is a healthy eating plan that has been shown to reduce high blood pressure (hypertension). Additional health benefits may include reducing the risk of type 2 diabetes mellitus, heart disease, and stroke. The DASH eating plan may also help with weight loss. °WHAT DO I NEED TO KNOW ABOUT THE DASH EATING PLAN? °For the DASH eating plan, you will follow these general guidelines: °· Choose foods with a percent daily value for sodium of less than 5% (as listed on the food label). °· Use salt-free seasonings or herbs instead of table salt or sea salt. °· Check with your health care provider or pharmacist before using salt substitutes. °· Eat lower-sodium products, often labeled as "lower sodium" or "no salt added." °· Eat fresh foods. °· Eat more vegetables, fruits, and low-fat dairy products. °· Choose whole grains. Look for the word "whole" as the first word in the ingredient list. °· Choose fish and skinless chicken or turkey more often than red meat. Limit fish, poultry, and meat to 6 oz (170 g) each day. °· Limit sweets, desserts, sugars, and sugary drinks. °· Choose heart-healthy fats. °· Limit cheese to 1 oz (28 g) per day. °· Eat more home-cooked food and less restaurant, buffet, and fast food. °· Limit fried foods. °· Cook foods using methods other than frying. °· Limit canned vegetables. If you do use them, rinse them well to decrease the sodium. °· When eating at a restaurant, ask that your food be prepared with less salt, or no salt if possible. °WHAT FOODS CAN I EAT? °Seek help from a dietitian for individual calorie needs. °Grains °Whole grain or whole wheat bread. Brown rice. Whole grain or whole wheat pasta. Quinoa, bulgur, and whole grain cereals. Low-sodium cereals. Corn or whole wheat flour tortillas. Whole grain cornbread. Whole grain crackers. Low-sodium crackers. °Vegetables °Fresh or frozen vegetables  (raw, steamed, roasted, or grilled). Low-sodium or reduced-sodium tomato and vegetable juices. Low-sodium or reduced-sodium tomato sauce and paste. Low-sodium or reduced-sodium canned vegetables.  °Fruits °All fresh, canned (in natural juice), or frozen fruits. °Meat and Other Protein Products °Ground beef (85% or leaner), grass-fed beef, or beef trimmed of fat. Skinless chicken or turkey. Ground chicken or turkey. Pork trimmed of fat. All fish and seafood. Eggs. Dried beans, peas, or lentils. Unsalted nuts and seeds. Unsalted canned beans. °Dairy °Low-fat dairy products, such as skim or 1% milk, 2% or reduced-fat cheeses, low-fat ricotta or cottage cheese, or plain low-fat yogurt. Low-sodium or reduced-sodium cheeses. °Fats and Oils °Tub margarines without trans fats. Light or reduced-fat mayonnaise and salad dressings (reduced sodium). Avocado. Safflower, olive, or canola oils. Natural peanut or almond butter. °Other °Unsalted popcorn and pretzels. °The items listed above may not be a complete list of recommended foods or beverages. Contact your dietitian for more options. °WHAT FOODS ARE NOT RECOMMENDED? °Grains °White bread. White pasta. White rice. Refined cornbread. Bagels and croissants. Crackers that contain trans fat. °Vegetables °Creamed or fried vegetables. Vegetables in a cheese sauce. Regular canned vegetables. Regular canned tomato sauce and paste. Regular tomato and vegetable juices. °Fruits °Dried fruits. Canned fruit in light or heavy syrup. Fruit juice. °Meat and Other Protein Products °Fatty cuts of meat. Ribs, chicken wings, bacon, sausage, bologna, salami, chitterlings, fatback, hot dogs, bratwurst, and packaged luncheon meats. Salted nuts and seeds. Canned beans with salt. °Dairy °Whole or 2% milk, cream, half-and-half, and cream cheese. Whole-fat or sweetened yogurt. Full-fat   cheeses or blue cheese. Nondairy creamers and whipped toppings. Processed cheese, cheese spreads, or cheese  curds. °Condiments °Onion and garlic salt, seasoned salt, table salt, and sea salt. Canned and packaged gravies. Worcestershire sauce. Tartar sauce. Barbecue sauce. Teriyaki sauce. Soy sauce, including reduced sodium. Steak sauce. Fish sauce. Oyster sauce. Cocktail sauce. Horseradish. Ketchup and mustard. Meat flavorings and tenderizers. Bouillon cubes. Hot sauce. Tabasco sauce. Marinades. Taco seasonings. Relishes. °Fats and Oils °Butter, stick margarine, lard, shortening, ghee, and bacon fat. Coconut, palm kernel, or palm oils. Regular salad dressings. °Other °Pickles and olives. Salted popcorn and pretzels. °The items listed above may not be a complete list of foods and beverages to avoid. Contact your dietitian for more information. °WHERE CAN I FIND MORE INFORMATION? °National Heart, [employer], [employer] Institute: www.nhlbi.nih.gov/health/health-topics/topics/dash/ °Document Released: 06/08/2011 Document Revised: 11/03/2013 Document Reviewed: 04/23/2013 °ExitCare® Patient Information ©2015 [employer]. This information is not intended to replace advice given to you by your health care provider. Make sure you discuss any questions you have with your health care provider. ° °

## 2014-07-17 NOTE — Progress Notes (Signed)
Patient presents for BP check Med list reviewed; states taking all meds as directed Labs from last OV reviewed with patient Discussed need for low sodium diet and using Mrs. Dash as alternative to salt States swimming and water aerobics 1-2 hours 3 days per week  BP 110/72 left arm manually with adult cuff P 72 R 20  T 98.2 oral SPO2  97%  Patient advised to call for med refills at least 7 days before running out so as not to go without. Patient aware that she is to f/u with PCP 3 months from last visit (Due 09/28/14)  Patient given literature on DASH Eating Plan

## 2014-07-27 ENCOUNTER — Ambulatory Visit: Payer: No Typology Code available for payment source

## 2014-07-31 ENCOUNTER — Other Ambulatory Visit: Payer: Self-pay | Admitting: Internal Medicine

## 2014-08-19 ENCOUNTER — Ambulatory Visit: Payer: No Typology Code available for payment source | Attending: Internal Medicine

## 2014-08-25 ENCOUNTER — Ambulatory Visit: Payer: No Typology Code available for payment source

## 2014-08-27 ENCOUNTER — Encounter: Payer: Self-pay | Admitting: Internal Medicine

## 2014-08-27 ENCOUNTER — Ambulatory Visit: Payer: No Typology Code available for payment source | Attending: Internal Medicine | Admitting: Internal Medicine

## 2014-08-27 DIAGNOSIS — M549 Dorsalgia, unspecified: Secondary | ICD-10-CM | POA: Insufficient documentation

## 2014-08-27 DIAGNOSIS — J069 Acute upper respiratory infection, unspecified: Secondary | ICD-10-CM | POA: Insufficient documentation

## 2014-08-27 DIAGNOSIS — G8929 Other chronic pain: Secondary | ICD-10-CM | POA: Insufficient documentation

## 2014-08-27 DIAGNOSIS — Z7982 Long term (current) use of aspirin: Secondary | ICD-10-CM | POA: Insufficient documentation

## 2014-08-27 DIAGNOSIS — I10 Essential (primary) hypertension: Secondary | ICD-10-CM | POA: Insufficient documentation

## 2014-08-27 DIAGNOSIS — B9789 Other viral agents as the cause of diseases classified elsewhere: Secondary | ICD-10-CM

## 2014-08-27 DIAGNOSIS — Z79899 Other long term (current) drug therapy: Secondary | ICD-10-CM | POA: Insufficient documentation

## 2014-08-27 MED ORDER — ALBUTEROL SULFATE (2.5 MG/3ML) 0.083% IN NEBU: 2.5000 mg | INHALATION_SOLUTION | Freq: Four times a day (QID) | RESPIRATORY_TRACT | Status: DC | PRN

## 2014-08-27 NOTE — Patient Instructions (Signed)
Tylenol multi-symptom cold or theraflu   Upper Respiratory Infection, Adult An upper respiratory infection (URI) is also sometimes known as the common cold. The upper respiratory tract includes the nose, sinuses, throat, trachea, and bronchi. Bronchi are the airways leading to the lungs. Most people improve within 1 week, but symptoms can last up to 2 weeks. A residual cough may last even longer.  CAUSES Many different viruses can infect the tissues lining the upper respiratory tract. The tissues become irritated and inflamed and often become very moist. Mucus production is also common. A cold is contagious. You can easily spread the virus to others by oral contact. This includes kissing, sharing a glass, coughing, or sneezing. Touching your mouth or nose and then touching a surface, which is then touched by another person, can also spread the virus. SYMPTOMS  Symptoms typically develop 1 to 3 days after you come in contact with a cold virus. Symptoms vary from person to person. They may include:  Runny nose.  Sneezing.  Nasal congestion.  Sinus irritation.  Sore throat.  Loss of voice (laryngitis).  Cough.  Fatigue.  Muscle aches.  Loss of appetite.  Headache.  Low-grade fever. DIAGNOSIS  You might diagnose your own cold based on familiar symptoms, since most people get a cold 2 to 3 times a year. Your caregiver can confirm this based on your exam. Most importantly, your caregiver can check that your symptoms are not due to another disease such as strep throat, sinusitis, pneumonia, asthma, or epiglottitis. Blood tests, throat tests, and X-rays are not necessary to diagnose a common cold, but they may sometimes be helpful in excluding other more serious diseases. Your caregiver will decide if any further tests are required. RISKS AND COMPLICATIONS  You may be at risk for a more severe case of the common cold if you smoke cigarettes, have chronic heart disease (such as heart  failure) or lung disease (such as asthma), or if you have a weakened immune system. The very young and very old are also at risk for more serious infections. Bacterial sinusitis, middle ear infections, and bacterial pneumonia can complicate the common cold. The common cold can worsen asthma and chronic obstructive pulmonary disease (COPD). Sometimes, these complications can require emergency medical care and may be life-threatening. PREVENTION  The best way to protect against getting a cold is to practice good hygiene. Avoid oral or hand contact with people with cold symptoms. Wash your hands often if contact occurs. There is no clear evidence that vitamin C, vitamin E, echinacea, or exercise reduces the chance of developing a cold. However, it is always recommended to get plenty of rest and practice good nutrition. TREATMENT  Treatment is directed at relieving symptoms. There is no cure. Antibiotics are not effective, because the infection is caused by a virus, not by bacteria. Treatment may include:  Increased fluid intake. Sports drinks offer valuable electrolytes, sugars, and fluids.  Breathing heated mist or steam (vaporizer or shower).  Eating chicken soup or other clear broths, and maintaining good nutrition.  Getting plenty of rest.  Using gargles or lozenges for comfort.  Controlling fevers with ibuprofen or acetaminophen as directed by your caregiver.  Increasing usage of your inhaler if you have asthma. Zinc gel and zinc lozenges, taken in the first 24 hours of the common cold, can shorten the duration and lessen the severity of symptoms. Pain medicines may help with fever, muscle aches, and throat pain. A variety of non-prescription medicines are available to  treat congestion and runny nose. Your caregiver can make recommendations and may suggest nasal or lung inhalers for other symptoms.  HOME CARE INSTRUCTIONS   Only take over-the-counter or prescription medicines for pain,  discomfort, or fever as directed by your caregiver.  Use a warm mist humidifier or inhale steam from a shower to increase air moisture. This may keep secretions moist and make it easier to breathe.  Drink enough water and fluids to keep your urine clear or pale yellow.  Rest as needed.  Return to work when your temperature has returned to normal or as your caregiver advises. You may need to stay home longer to avoid infecting others. You can also use a face mask and careful hand washing to prevent spread of the virus. SEEK MEDICAL CARE IF:   After the first few days, you feel you are getting worse rather than better.  You need your caregiver's advice about medicines to control symptoms.  You develop chills, worsening shortness of breath, or brown or red sputum. These may be signs of pneumonia.  You develop yellow or brown nasal discharge or pain in the face, especially when you bend forward. These may be signs of sinusitis.  You develop a fever, swollen neck glands, pain with swallowing, or white areas in the back of your throat. These may be signs of strep throat. SEEK IMMEDIATE MEDICAL CARE IF:   You have a fever.  You develop severe or persistent headache, ear pain, sinus pain, or chest pain.  You develop wheezing, a prolonged cough, cough up blood, or have a change in your usual mucus (if you have chronic lung disease).  You develop sore muscles or a stiff neck. Document Released: 12/13/2000 Document Revised: 09/11/2011 Document Reviewed: 09/24/2013 [facility] Patient Information 2015 [place], [place]. This information is not intended to replace advice given to you by your health care provider. Make sure you discuss any questions you have with your health care provider.

## 2014-08-27 NOTE — Progress Notes (Signed)
Patient ID: Emily Clarke, female   DOB: [birth year], 64 y.o.   MRN: [id number]  CC:  HPI: Emily Clarke is a 64 y.o. female here today for a follow up visit.  Patient has past medical history of HTN, asthma, and chronic back pain. She presents to clinic today with concerns of sore throat, cough, and yellow mucous production for the past 3 days.  She has tried tussin, benadryl. States that she usually has bronchitis around this time of year and needs to have a refill of her albuterol inhaler.  She denies fevers, chills, nausea, vomiting.  Patient has No headache, No chest pain, No abdominal pain - No Nausea, No new weakness tingling or numbness.  No Known Allergies Past Medical History  Diagnosis Date  . Tachycardia   . Hypertension   . Asthma   . SVT (supraventricular tachycardia)   . Headache   . Chronic back pain   . Rotator cuff syndrome of left shoulder    Current Outpatient Prescriptions on File Prior to Visit  Medication Sig Dispense Refill  . aspirin EC 81 MG tablet Take 81 mg by mouth daily.    . hydrochlorothiazide (HYDRODIURIL) 12.5 MG tablet Take 1 tablet (12.5 mg total) by mouth daily. 90 tablet 3  . metoprolol succinate (TOPROL-XL) 25 MG 24 hr tablet Take 1 tablet (25 mg total) by mouth daily. 90 tablet 3  . pantoprazole (PROTONIX) 40 MG tablet Take 1 tablet (40 mg total) by mouth daily. 90 tablet 3   No current facility-administered medications on file prior to visit.   Family History  Problem Relation Age of Onset  . Heart disease Mother   . Prostate cancer Father   . Colon polyps Father   . Heart disease Father   . Hyperlipidemia Father   . Hyperlipidemia Mother   . Heart attack Mother   . Heart attack Father    History   Social History  . Marital Status: Divorced    Spouse Name: N/A  . Number of Children: 3  . Years of Education: N/A   Occupational History  .     Social History Main Topics  . Smoking status: Never Smoker   . Smokeless tobacco:  Never Used  . Alcohol Use: No  . Drug Use: No  . Sexual Activity: Yes    Birth Control/ Protection: Surgical   Other Topics Concern  . Not on file   Social History Narrative    Review of Systems: See HPI    Objective:   Filed Vitals:   08/27/14 1055  BP: 103/74  Pulse: 76  Temp: 97.7 F (36.5 C)  Resp: 16    Physical Exam  Constitutional: She is oriented to person, place, and time. No distress.  HENT:  Right Ear: External ear normal.  Left Ear: External ear normal.  Mouth/Throat: Oropharynx is clear and moist.  Cardiovascular: Normal rate, regular rhythm and normal heart sounds.   Pulmonary/Chest: Effort normal. She has wheezes.  Neurological: She is alert and oriented to person, place, and time.  Skin: Skin is warm and dry. She is not diaphoretic.     Lab Results  Component Value Date   WBC 7.7 06/03/2014   HGB 12.2 06/03/2014   HCT 35.4* 06/03/2014   MCV 86.6 06/03/2014   PLT 245 06/03/2014   Lab Results  Component Value Date   CREATININE 0.74 06/03/2014   BUN 10 06/03/2014   NA 146 06/03/2014   K 3.5* 06/03/2014   CL  103 06/03/2014   CO2 30 06/03/2014    Lab Results  Component Value Date   HGBA1C 5.8 06/29/2014   Lipid Panel     Component Value Date/Time   CHOL 174 06/29/2014 1515   TRIG 241* 06/29/2014 1515   HDL 31* 06/29/2014 1515   CHOLHDL 5.6 06/29/2014 1515   VLDL 48* 06/29/2014 1515   LDLCALC 95 06/29/2014 1515       Assessment and plan:   Emily Clarke was seen today for follow-up.  Diagnoses and all orders for this visit: Viral URI with cough Orders: -     albuterol (PROVENTIL) (2.5 MG/3ML) 0.083% nebulizer solution; Take 3 mLs (2.5 mg total) by nebulization every 6 (six) hours as needed for wheezing or shortness of breath. Orders: -     Rapid Strep A---negative Explained that patient does not need antibiotic therapy. Gave her examples of OTC medication that she may use to treat symptoms. She may use humidifier as well. If no  improvement in 1 week, RTC for further evaluation  Return if symptoms worsen or fail to improve, for with [facility].       Chari Manning, [facility] [phone] 08/27/2014, 11:15 AM

## 2014-08-27 NOTE — Progress Notes (Signed)
Pt is here today c/o a cold w/ a sore throat, coughing up yellow mucus. Pt needs her medications refilled.

## 2014-09-01 ENCOUNTER — Ambulatory Visit: Payer: No Typology Code available for payment source

## 2014-09-29 ENCOUNTER — Ambulatory Visit: Payer: No Typology Code available for payment source | Attending: Internal Medicine | Admitting: Family Medicine

## 2014-09-29 ENCOUNTER — Encounter: Payer: Self-pay | Admitting: Family Medicine

## 2014-09-29 VITALS — BP 124/79 | HR 71 | Temp 98.2°F | Resp 16 | Ht 64.0 in | Wt 149.0 lb

## 2014-09-29 DIAGNOSIS — K088 Other specified disorders of teeth and supporting structures: Secondary | ICD-10-CM | POA: Insufficient documentation

## 2014-09-29 DIAGNOSIS — K029 Dental caries, unspecified: Secondary | ICD-10-CM | POA: Insufficient documentation

## 2014-09-29 MED ORDER — AMOXICILLIN 500 MG PO CAPS: 500.0000 mg | ORAL_CAPSULE | Freq: Three times a day (TID) | ORAL | Status: DC

## 2014-09-29 MED ORDER — TRAMADOL HCL 50 MG PO TABS: 50.0000 mg | ORAL_TABLET | Freq: Three times a day (TID) | ORAL | Status: DC | PRN

## 2014-09-29 NOTE — Progress Notes (Signed)
Pt comes in with c/o sudden tooth ache left side that started last night with worsening throbbing pain Denies swelling or fever C/o slight headache No OTC meds taken

## 2014-09-29 NOTE — Progress Notes (Signed)
Patient ID: Emily Clarke, female   DOB: Mar 24, 1951, 64 y.o.   MRN: [id number]    Chief Complaint:    Tooth ache  Subjective:  Patient presents with tooth starting a couple of days ago and worsening. It is keeping her awake and out of class today. She describes as throbbing. = Alert, oriented, appropriate, in no acute distress. There is a cavity in a left upper molar with erythema and tenderness of the surrounding gum tissue.  Assessment:  Dental Caries with infection  Plan:  Amoxicillin 500 tid for 10 days. Tramadol 50 mg q 8 hours for pain prn Warm salt water rinses. Advised to avoid hot and cold drinks. Advised she will be called about dental appointment.   Micheline Chapman, FNP [facility]

## 2014-09-29 NOTE — Patient Instructions (Signed)
Warm salt water rinses. Tramadol for pain every 8 hours if needed Amoxicillin for infection every 8 hours for 10 days. You will receive a call about dental appointment Follow-up here as needed.

## 2014-10-31 ENCOUNTER — Emergency Department (HOSPITAL_COMMUNITY)
Admission: EM | Admit: 2014-10-31 | Discharge: 2014-10-31 | Disposition: A | Discharge status: Home / Self Care | Payer: No Typology Code available for payment source | Attending: Emergency Medicine | Admitting: Emergency Medicine

## 2014-10-31 ENCOUNTER — Emergency Department (HOSPITAL_COMMUNITY): Payer: No Typology Code available for payment source

## 2014-10-31 ENCOUNTER — Encounter (HOSPITAL_COMMUNITY): Payer: Self-pay | Admitting: *Deleted

## 2014-10-31 DIAGNOSIS — Z7982 Long term (current) use of aspirin: Secondary | ICD-10-CM | POA: Insufficient documentation

## 2014-10-31 DIAGNOSIS — G8929 Other chronic pain: Secondary | ICD-10-CM | POA: Insufficient documentation

## 2014-10-31 DIAGNOSIS — R079 Chest pain, unspecified: Secondary | ICD-10-CM | POA: Insufficient documentation

## 2014-10-31 DIAGNOSIS — J45909 Unspecified asthma, uncomplicated: Secondary | ICD-10-CM | POA: Insufficient documentation

## 2014-10-31 DIAGNOSIS — Z8739 Personal history of other diseases of the musculoskeletal system and connective tissue: Secondary | ICD-10-CM | POA: Insufficient documentation

## 2014-10-31 DIAGNOSIS — Z79899 Other long term (current) drug therapy: Secondary | ICD-10-CM | POA: Insufficient documentation

## 2014-10-31 DIAGNOSIS — I10 Essential (primary) hypertension: Secondary | ICD-10-CM | POA: Insufficient documentation

## 2014-10-31 LAB — BASIC METABOLIC PANEL
Anion gap: 10 (ref 5–15)
BUN: 10 mg/dL (ref 6–23)
CO2: 29 mmol/L (ref 19–32)
Calcium: 9.2 mg/dL (ref 8.4–10.5)
Chloride: 101 mmol/L (ref 96–112)
Creatinine, Ser: 0.75 mg/dL (ref 0.50–1.10)
GFR calc Af Amer: >90 mL/min (ref >90)
GFR calc non Af Amer: 90 mL/min — ABNORMAL LOW (ref >90)
Glucose, Bld: 111 mg/dL — ABNORMAL HIGH (ref 70–99)
Potassium: 3.3 mmol/L — ABNORMAL LOW (ref 3.5–5.1)
Sodium: 140 mmol/L (ref 135–145)

## 2014-10-31 LAB — CBC
HCT: 35.9 % — ABNORMAL LOW (ref 36.0–46.0)
Hemoglobin: 12.1 g/dL (ref 12.0–15.0)
MCH: 29.1 pg (ref 26.0–34.0)
MCHC: 33.7 g/dL (ref 30.0–36.0)
MCV: 86.3 fL (ref 78.0–100.0)
Platelets: 233 10*3/uL (ref 150–400)
RBC: 4.16 MIL/uL (ref 3.87–5.11)
RDW: 13.4 % (ref 11.5–15.5)
WBC: 8.3 10*3/uL (ref 4.0–10.5)

## 2014-10-31 LAB — I-STAT TROPONIN, ED: Troponin i, poc: 0 ng/mL (ref 0.00–0.08)

## 2014-10-31 NOTE — ED Notes (Signed)
Pt states that she began having chest pain starting last night. Pt states that she feels the pain is constant and worse with breathing. Pt states that pain is relieved when she lays down.

## 2014-10-31 NOTE — ED Provider Notes (Signed)
CSN: [id number]     Arrival date & time 10/31/14  0709 History   First MD Initiated Contact with Patient 10/31/14 [phone]     Chief Complaint  Patient presents with  . Chest Pain     (Consider location/radiation/quality/duration/timing/severity/associated sxs/prior Treatment) Patient is a 64 y.o. female presenting with chest pain. The history is provided by the patient.  Chest Pain Associated symptoms: no abdominal pain, no back pain, no headache, no nausea, no numbness, no shortness of breath, not vomiting and no weakness    patient with   chest pain. Saw her left chest from below her left breast going down to her back and up to her left shoulder. It is somewhat dull. States it is worse with lying back. No shortness of breath. No fevers. States she's had a cough and feels that there is something in her chest. No swelling or legs. No previous coronary artery disease. Family had heart problems but it sounds if it was later in life. Patient has had a 2-D echo somewhat recently that showed no wall motion abnormalities. She does have cardiologist. No diaphoresis. No nausea. She also states that her left eye has been twitching. She states it feels both eyelids twitch a little bit. Feels something is walking on it. No change in her vision in that eye. States she does not have much vision in the other eye. Past Medical History  Diagnosis Date  . Tachycardia   . Hypertension   . Asthma   . SVT (supraventricular tachycardia)   . Headache   . Chronic back pain   . Rotator cuff syndrome of left shoulder    Past Surgical History  Procedure Laterality Date  . Abdominal hysterectomy    . Cholecystectomy    . Tubal ligation     Family History  Problem Relation Age of Onset  . Heart disease Mother   . Prostate cancer Father   . Colon polyps Father   . Heart disease Father   . Hyperlipidemia Father   . Hyperlipidemia Mother   . Heart attack Mother   . Heart attack Father    History  Substance  Use Topics  . Smoking status: Never Smoker   . Smokeless tobacco: Never Used  . Alcohol Use: No   OB History    Gravida Para Term Preterm AB TAB SAB Ectopic Multiple Living   3 3 3       2      Review of Systems  Constitutional: Negative for activity change and appetite change.  Eyes: Negative for photophobia, pain, discharge, redness and itching.  Respiratory: Negative for chest tightness and shortness of breath.   Cardiovascular: Positive for chest pain. Negative for leg swelling.  Gastrointestinal: Negative for nausea, vomiting, abdominal pain and diarrhea.  Genitourinary: Negative for flank pain.  Musculoskeletal: Negative for back pain and neck stiffness.  Skin: Negative for rash.  Neurological: Negative for weakness, numbness and headaches.  Psychiatric/Behavioral: Negative for behavioral problems.      Allergies  Review of patient's allergies indicates no known allergies.  Home Medications   Prior to Admission medications   Medication Sig Start Date End Date Taking? Authorizing Provider  aspirin EC 81 MG tablet Take 81 mg by mouth daily.   Yes Historical Provider, MD  hydrochlorothiazide (HYDRODIURIL) 12.5 MG tablet Take 1 tablet (12.5 mg total) by mouth daily. 06/29/14  Yes Tresa Garter, MD  metoprolol succinate (TOPROL-XL) 25 MG 24 hr tablet Take 1 tablet (25 mg total) by  mouth daily. 06/29/14  Yes Tresa Garter, MD  pantoprazole (PROTONIX) 40 MG tablet Take 1 tablet (40 mg total) by mouth daily. 06/29/14  Yes Tresa Garter, MD  traMADol (ULTRAM) 50 MG tablet Take 1 tablet (50 mg total) by mouth every 8 (eight) hours as needed. 09/29/14  Yes Micheline Chapman, NP  albuterol (PROVENTIL) (2.5 MG/3ML) 0.083% nebulizer solution Take 3 mLs (2.5 mg total) by nebulization every 6 (six) hours as needed for wheezing or shortness of breath. 08/27/14   Lance Bosch, NP  amoxicillin (AMOXIL) 500 MG capsule Take 1 capsule (500 mg total) by mouth 3 (three) times  daily. Patient not taking: Reported on 10/31/2014 09/29/14   Micheline Chapman, NP   BP 112/76 mmHg  Pulse 76  Temp(Src) 97.8 F (36.6 C) (Oral)  Resp 16  Ht 5\' 4"  (1.626 m)  SpO2 97% Physical Exam  Constitutional: She is oriented to person, place, and time. She appears well-developed and well-nourished.  HENT:  Head: Normocephalic and atraumatic.  Eyes: EOM are normal. Pupils are equal, round, and reactive to light. Left eye exhibits no discharge.  No abnormality seen of left eyelid.  Neck: Normal range of motion. Neck supple.  Cardiovascular: Normal rate, regular rhythm and normal heart sounds.   No murmur heard. Pulmonary/Chest: Effort normal and breath sounds normal. No respiratory distress. She has no wheezes. She has no rales.  Abdominal: Soft. Bowel sounds are normal. She exhibits no distension. There is no tenderness. There is no rebound and no guarding.  Musculoskeletal: Normal range of motion.  Neurological: She is alert and oriented to person, place, and time. No cranial nerve deficit.  Skin: Skin is warm and dry.  Psychiatric: She has a normal mood and affect. Her speech is normal.  Nursing note and vitals reviewed.   ED Course  Procedures (including critical care time) Labs Review Labs Reviewed  CBC - Abnormal; Notable for the following:    HCT 35.9 (*)    All other components within normal limits  BASIC METABOLIC PANEL - Abnormal; Notable for the following:    Potassium 3.3 (*)    Glucose, Bld 111 (*)    GFR calc non Af Amer 90 (*)    All other components within normal limits  I-STAT TROPOININ, ED    Imaging Review Dg Chest 2 View  10/31/2014   CLINICAL DATA:  Left-sided chest pain extending into left arm for 1 day  EXAM: CHEST  2 VIEW  COMPARISON:  June 03, 2014  FINDINGS: There is no edema or consolidation. The heart size and pulmonary vascularity are normal. No adenopathy. No pneumothorax. No bone lesions.  IMPRESSION: No edema or consolidation.    Electronically Signed   By: Lowella Grip III M.D.   On: 10/31/2014 07:58     EKG Interpretation   Date/Time:  Saturday October 31 2014 07:16:15 EDT Ventricular Rate:  73 PR Interval:  196 QRS Duration: 82 QT Interval:  400 QTC Calculation: 440 R Axis:   66 Text Interpretation:  Normal sinus rhythm Cannot rule out Anterior infarct  , age undetermined Abnormal ECG Confirmed by Rechelle Niebla  MD, Samiyyah Moffa [phone])  on 10/31/2014 7:22:07 AM      MDM   Final diagnoses:  Chest pain, unspecified chest pain type    Patient with left-sided chest pain. EKG and enzymes reassuring. X-ray reassuring. Has had recent reassuring echo. Will need to follow with cardiology or her primary care doctor.    Davonna Belling,  MD 10/31/14 1007

## 2014-10-31 NOTE — ED Notes (Signed)
Patient transported to X-ray 

## 2014-10-31 NOTE — Discharge Instructions (Signed)

## 2014-11-26 ENCOUNTER — Other Ambulatory Visit: Payer: Self-pay

## 2014-11-26 DIAGNOSIS — I10 Essential (primary) hypertension: Secondary | ICD-10-CM

## 2014-11-26 DIAGNOSIS — K2100 Gastro-esophageal reflux disease with esophagitis, without bleeding: Secondary | ICD-10-CM

## 2014-11-26 MED ORDER — PANTOPRAZOLE SODIUM 40 MG PO TBEC: 40.0000 mg | DELAYED_RELEASE_TABLET | Freq: Every day | ORAL | Status: DC

## 2014-11-26 MED ORDER — HYDROCHLOROTHIAZIDE 12.5 MG PO TABS: 12.5000 mg | ORAL_TABLET | Freq: Every day | ORAL | Status: DC

## 2014-11-26 MED ORDER — METOPROLOL SUCCINATE ER 25 MG PO TB24: 25.0000 mg | ORAL_TABLET | Freq: Every day | ORAL | Status: DC

## 2014-11-26 NOTE — Progress Notes (Unsigned)
Patient here in office requesting refills on her medications until she comes  In for her appointment [facility]

## 2014-12-07 ENCOUNTER — Ambulatory Visit: Payer: No Typology Code available for payment source | Attending: Internal Medicine | Admitting: Internal Medicine

## 2014-12-07 ENCOUNTER — Encounter: Payer: Self-pay | Admitting: Internal Medicine

## 2014-12-07 VITALS — BP 131/82 | HR 68 | Temp 98.0°F | Ht 64.0 in | Wt 155.0 lb

## 2014-12-07 DIAGNOSIS — J45909 Unspecified asthma, uncomplicated: Secondary | ICD-10-CM | POA: Insufficient documentation

## 2014-12-07 DIAGNOSIS — Z0000 Encounter for general adult medical examination without abnormal findings: Secondary | ICD-10-CM

## 2014-12-07 DIAGNOSIS — K2100 Gastro-esophageal reflux disease with esophagitis, without bleeding: Secondary | ICD-10-CM

## 2014-12-07 DIAGNOSIS — G8929 Other chronic pain: Secondary | ICD-10-CM | POA: Insufficient documentation

## 2014-12-07 DIAGNOSIS — I471 Supraventricular tachycardia: Secondary | ICD-10-CM | POA: Insufficient documentation

## 2014-12-07 DIAGNOSIS — R7309 Other abnormal glucose: Secondary | ICD-10-CM | POA: Insufficient documentation

## 2014-12-07 DIAGNOSIS — E876 Hypokalemia: Secondary | ICD-10-CM | POA: Insufficient documentation

## 2014-12-07 DIAGNOSIS — Z7982 Long term (current) use of aspirin: Secondary | ICD-10-CM | POA: Insufficient documentation

## 2014-12-07 DIAGNOSIS — L84 Corns and callosities: Secondary | ICD-10-CM

## 2014-12-07 DIAGNOSIS — R7303 Prediabetes: Secondary | ICD-10-CM

## 2014-12-07 DIAGNOSIS — I10 Essential (primary) hypertension: Secondary | ICD-10-CM | POA: Insufficient documentation

## 2014-12-07 DIAGNOSIS — Z79899 Other long term (current) drug therapy: Secondary | ICD-10-CM | POA: Insufficient documentation

## 2014-12-07 DIAGNOSIS — M549 Dorsalgia, unspecified: Secondary | ICD-10-CM | POA: Insufficient documentation

## 2014-12-07 LAB — BASIC METABOLIC PANEL
BUN: 9 mg/dL (ref 6–23)
CO2: 26 mEq/L (ref 19–32)
Calcium: 9.1 mg/dL (ref 8.4–10.5)
Chloride: 100 mEq/L (ref 96–112)
Creat: 0.62 mg/dL (ref 0.50–1.10)
Glucose, Bld: 83 mg/dL (ref 70–99)
Potassium: 3.2 mEq/L — ABNORMAL LOW (ref 3.5–5.3)
Sodium: 140 mEq/L (ref 135–145)

## 2014-12-07 LAB — POCT GLYCOSYLATED HEMOGLOBIN (HGB A1C): Hemoglobin A1C: 5.9

## 2014-12-07 MED ORDER — HYDROCHLOROTHIAZIDE 12.5 MG PO TABS: 12.5000 mg | ORAL_TABLET | Freq: Every day | ORAL | Status: DC

## 2014-12-07 MED ORDER — METOPROLOL SUCCINATE ER 25 MG PO TB24: 25.0000 mg | ORAL_TABLET | Freq: Every day | ORAL | Status: DC

## 2014-12-07 MED ORDER — PANTOPRAZOLE SODIUM 40 MG PO TBEC: 40.0000 mg | DELAYED_RELEASE_TABLET | Freq: Every day | ORAL | Status: DC

## 2014-12-07 NOTE — Progress Notes (Signed)
Pt here to follow up HTN, needs meds refilled. Complains of a cyst on the bottom of her L foot x 2 weeks. Rates pain 8 out of 10. Wallace Cullens

## 2014-12-07 NOTE — Progress Notes (Signed)
Patient ID: Emily Clarke, female   DOB: 03-02-1951, 64 y.o.   MRN: [id number]   Emily Clarke, is a 65 y.o. female  [id number]:[id number]  [id number]:[id number]  DOB - 12-10-1950  Chief Complaint  Patient presents with  . Hypertension  . Foot Pain        Subjective:   Emily Clarke is a 64 y.o. female here today for a follow up visit. Patient has history of hypertension, asthma, chronic back pain and SVT here today for routine follow-up. Her major complaint today is left foot pain especially at the plantar surface, only while walking, this started 2 weeks ago she discovered a small cystlike swelling on the plantar surface. She did not remember any trauma, she is using padded shoes which helps with the pain. Her blood pressure is controlled on the current medications. She has prediabetes and would like to know her progress with hemoglobin A1c today. Last colonoscopy was in 2004, and she is due for mammogram. She also needs refills of her medications. Patient has No headache, No chest pain, No abdominal pain - No Nausea, No new weakness tingling or numbness, No Cough - SOB. During her last visit to the ED in April, her potassium level was low and has not been followed up since then.  Problem  Plantar Callus  Hypokalemia    ALLERGIES: No Known Allergies  PAST MEDICAL HISTORY: Past Medical History  Diagnosis Date  . Tachycardia   . Hypertension   . Asthma   . SVT (supraventricular tachycardia)   . Headache   . Chronic back pain   . Rotator cuff syndrome of left shoulder     MEDICATIONS AT HOME: Prior to Admission medications   Medication Sig Start Date End Date Taking? Authorizing Provider  albuterol (PROVENTIL) (2.5 MG/3ML) 0.083% nebulizer solution Take 3 mLs (2.5 mg total) by nebulization every 6 (six) hours as needed for wheezing or shortness of breath. 08/27/14   Lance Bosch, NP  amoxicillin (AMOXIL) 500 MG capsule Take 1 capsule (500 mg total) by mouth 3 (three) times daily. Patient  not taking: Reported on 10/31/2014 09/29/14   Micheline Chapman, NP  aspirin EC 81 MG tablet Take 81 mg by mouth daily.    Historical Provider, MD  hydrochlorothiazide (HYDRODIURIL) 12.5 MG tablet Take 1 tablet (12.5 mg total) by mouth daily. 12/07/14   Tresa Garter, MD  metoprolol succinate (TOPROL-XL) 25 MG 24 hr tablet Take 1 tablet (25 mg total) by mouth daily. 12/07/14   Tresa Garter, MD  pantoprazole (PROTONIX) 40 MG tablet Take 1 tablet (40 mg total) by mouth daily. 12/07/14   Tresa Garter, MD  traMADol (ULTRAM) 50 MG tablet Take 1 tablet (50 mg total) by mouth every 8 (eight) hours as needed. Patient not taking: Reported on 12/07/2014 09/29/14   Micheline Chapman, NP     Objective:   Filed Vitals:   12/07/14 1006  BP: 131/82  Pulse: 68  Temp: 98 F (36.7 C)  TempSrc: Oral  Height: 5\' 4"  (1.626 m)  Weight: 155 lb (70.308 kg)    Exam General appearance : Awake, alert, not in any distress. Speech Clear. Not toxic looking HEENT: Atraumatic and Normocephalic, pupils equally reactive to light and accomodation Neck: supple, no JVD. No cervical lymphadenopathy.  Chest:Good air entry bilaterally, no added sounds  CVS: S1 S2 regular, no murmurs.  Abdomen: Bowel sounds present, Non tender and not distended with no gaurding, rigidity or rebound. Extremities: B/L Lower Ext  shows no edema, both legs are warm to touch, callus Vs plantar fat atrophy on left Neurology: Awake alert, and oriented X 3, CN II-XII intact, Non focal Skin:No Rash  Data Review Lab Results  Component Value Date   HGBA1C 5.8 06/29/2014   HGBA1C 6.1* 08/11/2013   HGBA1C 6.1* 10/11/2012     Assessment & Plan   1. Essential hypertension, benign  - metoprolol succinate (TOPROL-XL) 25 MG 24 hr tablet; Take 1 tablet (25 mg total) by mouth daily.  Dispense: 90 tablet; Refill: 3 - hydrochlorothiazide (HYDRODIURIL) 12.5 MG tablet; Take 1 tablet (12.5 mg total) by mouth daily.  Dispense: 90 tablet;  Refill: 3  - We have discussed target BP range and blood pressure goal - I have advised patient to check BP regularly and to call us back or report to clinic if the numbers are consistently higher than 140/90  - We discussed the importance of compliance with medical therapy and DASH diet recommended, consequences of uncontrolled hypertension discussed.  - continue current BP medications  2. Plantar callus  - Ambulatory referral to Podiatry  3. Prediabetes  - POCT glycosylated hemoglobin (Hb A1C)  Aim for 2-3 Carb Choices per meal (30-45 grams) +/- 1 either way  Aim for 0-15 Carbs per snack if hungry  Include protein in moderation with your meals and snacks  Consider reading food labels for Total Carbohydrate and Fat Grams of foods  Consider checking BG at alternate times per day  Continue taking medication as directed Fruit Punch - find one with no sugar  Measure and decrease portions of carbohydrate foods  Make your plate and don't go back for seconds   4. Hypokalemia  - Basic Metabolic Panel  5. Gastroesophageal reflux disease with esophagitis  - pantoprazole (PROTONIX) 40 MG tablet; Take 1 tablet (40 mg total) by mouth daily.  Dispense: 90 tablet; Refill: 3 Patient have been counseled extensively about nutrition and exercise Return in about 3 months (around 03/09/2015), or if symptoms worsen or fail to improve, for Follow up HTN, Annual Physical.  The patient was given clear instructions to go to ER or return to medical center if symptoms don't improve, worsen or new problems develop. The patient verbalized understanding. The patient was told to call to get lab results if they haven't heard anything in the next week.   This note has been created with Surveyor, quantity. Any transcriptional errors are unintentional.    Angelica Chessman, MD, [place], [place], [place], [place] and [place],  [place]   12/07/2014, 10:29 AM

## 2014-12-07 NOTE — Patient Instructions (Signed)
DASH Eating Plan DASH stands for "Dietary Approaches to Stop Hypertension." The DASH eating plan is a healthy eating plan that has been shown to reduce high blood pressure (hypertension). Additional health benefits may include reducing the risk of type 2 diabetes mellitus, heart disease, and stroke. The DASH eating plan may also help with weight loss. WHAT DO I NEED TO KNOW ABOUT THE DASH EATING PLAN? For the DASH eating plan, you will follow these general guidelines:  Choose foods with a percent daily value for sodium of less than 5% (as listed on the food label).  Use salt-free seasonings or herbs instead of table salt or sea salt.  Check with your health care provider or pharmacist before using salt substitutes.  Eat lower-sodium products, often labeled as "lower sodium" or "no salt added."  Eat fresh foods.  Eat more vegetables, fruits, and low-fat dairy products.  Choose whole grains. Look for the word "whole" as the first word in the ingredient list.  Choose fish and skinless chicken or turkey more often than red meat. Limit fish, poultry, and meat to 6 oz (170 g) each day.  Limit sweets, desserts, sugars, and sugary drinks.  Choose heart-healthy fats.  Limit cheese to 1 oz (28 g) per day.  Eat more home-cooked food and less restaurant, buffet, and fast food.  Limit fried foods.  Cook foods using methods other than frying.  Limit canned vegetables. If you do use them, rinse them well to decrease the sodium.  When eating at a restaurant, ask that your food be prepared with less salt, or no salt if possible. WHAT FOODS CAN I EAT? Seek help from a dietitian for individual calorie needs. Grains Whole grain or whole wheat bread. Brown rice. Whole grain or whole wheat pasta. Quinoa, bulgur, and whole grain cereals. Low-sodium cereals. Corn or whole wheat flour tortillas. Whole grain cornbread. Whole grain crackers. Low-sodium crackers. Vegetables Fresh or frozen vegetables  (raw, steamed, roasted, or grilled). Low-sodium or reduced-sodium tomato and vegetable juices. Low-sodium or reduced-sodium tomato sauce and paste. Low-sodium or reduced-sodium canned vegetables.  Fruits All fresh, canned (in natural juice), or frozen fruits. Meat and Other Protein Products Ground beef (85% or leaner), grass-fed beef, or beef trimmed of fat. Skinless chicken or turkey. Ground chicken or turkey. Pork trimmed of fat. All fish and seafood. Eggs. Dried beans, peas, or lentils. Unsalted nuts and seeds. Unsalted canned beans. Dairy Low-fat dairy products, such as skim or 1% milk, 2% or reduced-fat cheeses, low-fat ricotta or cottage cheese, or plain low-fat yogurt. Low-sodium or reduced-sodium cheeses. Fats and Oils Tub margarines without trans fats. Light or reduced-fat mayonnaise and salad dressings (reduced sodium). Avocado. Safflower, olive, or canola oils. Natural peanut or almond butter. Other Unsalted popcorn and pretzels. The items listed above may not be a complete list of recommended foods or beverages. Contact your dietitian for more options. WHAT FOODS ARE NOT RECOMMENDED? Grains White bread. White pasta. White rice. Refined cornbread. Bagels and croissants. Crackers that contain trans fat. Vegetables Creamed or fried vegetables. Vegetables in a cheese sauce. Regular canned vegetables. Regular canned tomato sauce and paste. Regular tomato and vegetable juices. Fruits Dried fruits. Canned fruit in light or heavy syrup. Fruit juice. Meat and Other Protein Products Fatty cuts of meat. Ribs, chicken wings, bacon, sausage, bologna, salami, chitterlings, fatback, hot dogs, bratwurst, and packaged luncheon meats. Salted nuts and seeds. Canned beans with salt. Dairy Whole or 2% milk, cream, half-and-half, and cream cheese. Whole-fat or sweetened yogurt. Full-fat   cheeses or blue cheese. Nondairy creamers and whipped toppings. Processed cheese, cheese spreads, or cheese  curds. Condiments Onion and garlic salt, seasoned salt, table salt, and sea salt. Canned and packaged gravies. Worcestershire sauce. Tartar sauce. Barbecue sauce. Teriyaki sauce. Soy sauce, including reduced sodium. Steak sauce. Fish sauce. Oyster sauce. Cocktail sauce. Horseradish. Ketchup and mustard. Meat flavorings and tenderizers. Bouillon cubes. Hot sauce. Tabasco sauce. Marinades. Taco seasonings. Relishes. Fats and Oils Butter, stick margarine, lard, shortening, ghee, and bacon fat. Coconut, palm kernel, or palm oils. Regular salad dressings. Other Pickles and olives. Salted popcorn and pretzels. The items listed above may not be a complete list of foods and beverages to avoid. Contact your dietitian for more information. WHERE CAN I FIND MORE INFORMATION? National Heart, Lung, and Blood Institute: www.nhlbi.nih.gov/health/health-topics/topics/dash/ Document Released: 06/08/2011 Document Revised: 11/03/2013 Document Reviewed: 04/23/2013 ExitCare Patient Information 2015 [employer]. This information is not intended to replace advice given to you by your health care provider. Make sure you discuss any questions you have with your health care provider. Hypertension Hypertension, commonly called high blood pressure, is when the force of blood pumping through your arteries is too strong. Your arteries are the blood vessels that carry blood from your heart throughout your body. A blood pressure reading consists of a higher number over a lower number, such as 110/72. The higher number (systolic) is the pressure inside your arteries when your heart pumps. The lower number (diastolic) is the pressure inside your arteries when your heart relaxes. Ideally you want your blood pressure below 120/80. Hypertension forces your heart to work harder to pump blood. Your arteries may become narrow or stiff. Having hypertension puts you at risk for heart disease, stroke, and other problems.  RISK  FACTORS Some risk factors for high blood pressure are controllable. Others are not.  Risk factors you cannot control include:   Race. You may be at higher risk if you are African American.  Age. Risk increases with age.  Gender. Men are at higher risk than women before age 45 years. After age 65, women are at higher risk than men. Risk factors you can control include:  Not getting enough exercise or physical activity.  Being overweight.  Getting too much fat, sugar, calories, or salt in your diet.  Drinking too much alcohol. SIGNS AND SYMPTOMS Hypertension does not usually cause signs or symptoms. Extremely high blood pressure (hypertensive crisis) may cause headache, anxiety, shortness of breath, and nosebleed. DIAGNOSIS  To check if you have hypertension, your health care provider will measure your blood pressure while you are seated, with your arm held at the level of your heart. It should be measured at least twice using the same arm. Certain conditions can cause a difference in blood pressure between your right and left arms. A blood pressure reading that is higher than normal on one occasion does not mean that you need treatment. If one blood pressure reading is high, ask your health care provider about having it checked again. TREATMENT  Treating high blood pressure includes making lifestyle changes and possibly taking medicine. Living a healthy lifestyle can help lower high blood pressure. You may need to change some of your habits. Lifestyle changes may include:  Following the DASH diet. This diet is high in fruits, vegetables, and whole grains. It is low in salt, red meat, and added sugars.  Getting at least 2 hours of brisk physical activity every week.  Losing weight if necessary.  Not smoking.  Limiting   alcoholic beverages.  Learning ways to reduce stress. If lifestyle changes are not enough to get your blood pressure under control, your health care provider may  prescribe medicine. You may need to take more than one. Work closely with your health care provider to understand the risks and benefits. HOME CARE INSTRUCTIONS  Have your blood pressure rechecked as directed by your health care provider.   Take medicines only as directed by your health care provider. Follow the directions carefully. Blood pressure medicines must be taken as prescribed. The medicine does not work as well when you skip doses. Skipping doses also puts you at risk for problems.   Do not smoke.   Monitor your blood pressure at home as directed by your health care provider. SEEK MEDICAL CARE IF:   You think you are having a reaction to medicines taken.  You have recurrent headaches or feel dizzy.  You have swelling in your ankles.  You have trouble with your vision. SEEK IMMEDIATE MEDICAL CARE IF:  You develop a severe headache or confusion.  You have unusual weakness, numbness, or feel faint.  You have severe chest or abdominal pain.  You vomit repeatedly.  You have trouble breathing. MAKE SURE YOU:   Understand these instructions.  Will watch your condition.  Will get help right away if you are not doing well or get worse. Document Released: 06/19/2005 Document Revised: 11/03/2013 Document Reviewed: 04/11/2013 ExitCare Patient Information 2015 [employer]. This information is not intended to replace advice given to you by your health care provider. Make sure you discuss any questions you have with your health care provider.  

## 2014-12-15 ENCOUNTER — Other Ambulatory Visit: Payer: Self-pay | Admitting: Internal Medicine

## 2014-12-15 ENCOUNTER — Telehealth: Payer: Self-pay

## 2014-12-15 MED ORDER — POTASSIUM CHLORIDE ER 10 MEQ PO TBCR: 10.0000 meq | EXTENDED_RELEASE_TABLET | Freq: Every day | ORAL | Status: DC

## 2014-12-15 NOTE — Telephone Encounter (Signed)
Nurse called patient, patient verified date of birth. Patient aware of slightly low potassium level. Patient agrees to pick up potassium supplement at pharmacy. Patient voices understanding and has no questions at this time.

## 2014-12-15 NOTE — Telephone Encounter (Signed)
-----   Message from Tresa Garter, MD sent at 12/15/2014  1:13 PM EDT ----- Please inform patient that her potassium level is still slightly low. I have sent a prescription for potassium supplement to the pharmacy for pickup.

## 2014-12-28 ENCOUNTER — Other Ambulatory Visit: Payer: Self-pay

## 2015-01-15 ENCOUNTER — Ambulatory Visit: Payer: No Typology Code available for payment source | Attending: Internal Medicine

## 2015-03-12 ENCOUNTER — Telehealth: Payer: Self-pay | Admitting: Internal Medicine

## 2015-03-12 NOTE — Telephone Encounter (Signed)
Patient called requesting a refill on all current medications. Please f/u

## 2015-03-15 ENCOUNTER — Other Ambulatory Visit: Payer: Self-pay | Admitting: *Deleted

## 2015-03-15 DIAGNOSIS — K2100 Gastro-esophageal reflux disease with esophagitis, without bleeding: Secondary | ICD-10-CM

## 2015-03-15 DIAGNOSIS — B9789 Other viral agents as the cause of diseases classified elsewhere: Secondary | ICD-10-CM

## 2015-03-15 DIAGNOSIS — I10 Essential (primary) hypertension: Secondary | ICD-10-CM

## 2015-03-15 DIAGNOSIS — J069 Acute upper respiratory infection, unspecified: Secondary | ICD-10-CM

## 2015-03-15 MED ORDER — HYDROCHLOROTHIAZIDE 12.5 MG PO TABS: 12.5000 mg | ORAL_TABLET | Freq: Every day | ORAL | Status: DC

## 2015-03-15 MED ORDER — METOPROLOL SUCCINATE ER 25 MG PO TB24: 25.0000 mg | ORAL_TABLET | Freq: Every day | ORAL | Status: DC

## 2015-03-15 MED ORDER — ALBUTEROL SULFATE (2.5 MG/3ML) 0.083% IN NEBU: 2.5000 mg | INHALATION_SOLUTION | Freq: Four times a day (QID) | RESPIRATORY_TRACT | Status: DC | PRN

## 2015-03-15 MED ORDER — ASPIRIN EC 81 MG PO TBEC: 81.0000 mg | DELAYED_RELEASE_TABLET | Freq: Every day | ORAL | Status: DC

## 2015-03-15 MED ORDER — POTASSIUM CHLORIDE ER 10 MEQ PO TBCR: 10.0000 meq | EXTENDED_RELEASE_TABLET | Freq: Every day | ORAL | Status: DC

## 2015-03-15 MED ORDER — PANTOPRAZOLE SODIUM 40 MG PO TBEC: 40.0000 mg | DELAYED_RELEASE_TABLET | Freq: Every day | ORAL | Status: DC

## 2015-03-16 ENCOUNTER — Encounter: Payer: Self-pay | Admitting: Family Medicine

## 2015-03-16 ENCOUNTER — Ambulatory Visit: Payer: No Typology Code available for payment source | Attending: Family Medicine | Admitting: Family Medicine

## 2015-03-16 VITALS — BP 159/103 | HR 72 | Temp 98.0°F | Ht 64.0 in | Wt 154.0 lb

## 2015-03-16 DIAGNOSIS — E876 Hypokalemia: Secondary | ICD-10-CM

## 2015-03-16 DIAGNOSIS — J0120 Acute ethmoidal sinusitis, unspecified: Secondary | ICD-10-CM

## 2015-03-16 DIAGNOSIS — R7309 Other abnormal glucose: Secondary | ICD-10-CM

## 2015-03-16 DIAGNOSIS — R7303 Prediabetes: Secondary | ICD-10-CM

## 2015-03-16 DIAGNOSIS — H5712 Ocular pain, left eye: Secondary | ICD-10-CM

## 2015-03-16 DIAGNOSIS — I10 Essential (primary) hypertension: Secondary | ICD-10-CM | POA: Insufficient documentation

## 2015-03-16 LAB — BASIC METABOLIC PANEL
BUN: 7 mg/dL (ref 7–25)
CO2: 30 mmol/L (ref 20–31)
Calcium: 9.3 mg/dL (ref 8.6–10.4)
Chloride: 103 mmol/L (ref 98–110)
Creat: 0.58 mg/dL (ref 0.50–0.99)
Glucose, Bld: 100 mg/dL — ABNORMAL HIGH (ref 65–99)
Potassium: 4.4 mmol/L (ref 3.5–5.3)
Sodium: 141 mmol/L (ref 135–146)

## 2015-03-16 MED ORDER — AMOXICILLIN 500 MG PO CAPS: 500.0000 mg | ORAL_CAPSULE | Freq: Three times a day (TID) | ORAL | Status: DC

## 2015-03-16 NOTE — Progress Notes (Signed)
Subjective:  Patient ID: Emily Clarke, female    DOB: [birth year]  Age: 64 y.o. MRN: [id number]  CC: Follow-up   HPI Emily Clarke  with a history of prediabetes, hypertension, hypokalemia presents for an acute visit complaining of cough productive of mild phlegm which is whitish for 3 days associated with nasal congestion and sinus congestion, sinus pressure and frontal headache but denies fever. She does endorse postnasal drip and is concerned she might have 'bronchitis'.  She has used Tussin and albuterol nebulizer all to no avail.  Her blood pressure is elevated today and she reports not taking her antihypertensives this morning. She also has a history of prediabetes and is not on any medications at this time. She complains of a funny sensation in her left eye and feels like something is moving inside of it but this symptom is absent in her right eye out of which she has no vision. She has not been to see an optometrist or ophthalmologist in a while. Denies visual loss. She would also like to follow-up on her colonoscopy which was ordered 3 months ago at her last office visit.  Outpatient Prescriptions Prior to Visit  Medication Sig Dispense Refill  . albuterol (PROVENTIL) (2.5 MG/3ML) 0.083% nebulizer solution Take 3 mLs (2.5 mg total) by nebulization every 6 (six) hours as needed for wheezing or shortness of breath. 150 mL 1  . aspirin EC 81 MG tablet Take 1 tablet (81 mg total) by mouth daily. 30 tablet 2  . hydrochlorothiazide (HYDRODIURIL) 12.5 MG tablet Take 1 tablet (12.5 mg total) by mouth daily. 90 tablet 3  . metoprolol succinate (TOPROL-XL) 25 MG 24 hr tablet Take 1 tablet (25 mg total) by mouth daily. 90 tablet 3  . pantoprazole (PROTONIX) 40 MG tablet Take 1 tablet (40 mg total) by mouth daily. 90 tablet 3  . potassium chloride (K-DUR) 10 MEQ tablet Take 1 tablet (10 mEq total) by mouth daily. 30 tablet 1  . traMADol (ULTRAM) 50 MG tablet Take 1 tablet (50 mg total) by  mouth every 8 (eight) hours as needed. (Patient not taking: Reported on 03/16/2015) 30 tablet 0  . amoxicillin (AMOXIL) 500 MG capsule Take 1 capsule (500 mg total) by mouth 3 (three) times daily. (Patient not taking: Reported on 10/31/2014) 30 capsule 0   No facility-administered medications prior to visit.    ROS Review of Systems  Constitutional: Negative for activity change, appetite change and fatigue.  HENT: Negative for congestion, sinus pressure and sore throat.   Eyes:       See history of present illness  Respiratory: Negative for cough, chest tightness, shortness of breath and wheezing.   Cardiovascular: Negative for chest pain and palpitations.  Gastrointestinal: Negative for abdominal pain, constipation and abdominal distention.  Endocrine: Negative for polydipsia.  Genitourinary: Negative for dysuria and frequency.  Musculoskeletal: Negative for back pain and arthralgias.  Skin: Negative for rash.  Neurological: Negative for tremors, light-headedness and numbness.  Hematological: Does not bruise/bleed easily.  Psychiatric/Behavioral: Negative for behavioral problems and agitation.    Objective:  BP 159/103 mmHg  Pulse 72  Temp(Src) 98 F (36.7 C)  Ht 5\' 4"  (1.626 m)  Wt 154 lb (69.854 kg)  BMI 26.42 kg/m2  SpO2 97%  BP/Weight 03/16/2015 12/07/2014 4/62/7035  Systolic BP 009 381 829  Diastolic BP 937 82 76  Wt. (Lbs) 154 155 -  BMI 26.42 26.59 -    Lab Results  Component Value Date  WBC 8.3 10/31/2014   HGB 12.1 10/31/2014   HCT 35.9* 10/31/2014   PLT 233 10/31/2014   GLUCOSE 83 12/07/2014   CHOL 174 06/29/2014   TRIG 241* 06/29/2014   HDL 31* 06/29/2014   LDLCALC 95 06/29/2014   ALT 17 10/11/2012   AST 16 10/11/2012   NA 140 12/07/2014   K 3.2* 12/07/2014   CL 100 12/07/2014   CREATININE 0.62 12/07/2014   BUN 9 12/07/2014   CO2 26 12/07/2014   TSH 1.785 08/11/2013   HGBA1C 5.90 12/07/2014    Physical Exam  Constitutional: She is oriented to  person, place, and time. She appears well-developed and well-nourished.  Nasal speech  Eyes: Conjunctivae and EOM are normal. Right eye exhibits no discharge. Left eye exhibits no discharge.  Cardiovascular: Normal rate, normal heart sounds and intact distal pulses.   No murmur heard. Pulmonary/Chest: Effort normal and breath sounds normal. She has no wheezes. She has no rales. She exhibits no tenderness.  Abdominal: Soft. Bowel sounds are normal. She exhibits no distension and no mass. There is no tenderness.  Musculoskeletal: Normal range of motion.  Neurological: She is alert and oriented to person, place, and time.  Skin: Skin is warm.  Psychiatric: She has a normal mood and affect.     Assessment & Plan:  64 year old female with a history of prediabetes, hypertension, hypokalemia presenting with symptoms suggestive of acute sinusitis.  1. Prediabetes Last A1c was 5.9 and 12/2014 Currently on ADA diet.  2. Essential hypertension Uncontrolled. Elevated BP secondary to the fact that patient is yet to take her morning dose of antihypertensives. Advised on Target blood pressure of less than 140/90, low-sodium diet, DASH diet.  3. Hypokalemia Potassium was 3.2 and 0/5697 - Basic Metabolic Panel  4. Acute ethmoidal sinusitis, recurrence not specified Advised to use OTC antihistamines and Alka-Seltzer plus - amoxicillin (AMOXIL) 500 MG capsule; Take 1 capsule (500 mg total) by mouth 3 (three) times daily.  Dispense: 30 capsule; Refill: 0  5. Eye discomfort, left Patient will need to see ophthalmology to evaluate this further at current exam is unrevealing.  I have sent an in basket message to the referral coordinator to follow up on the status of her colonoscopy referral.   Meds ordered this encounter  Medications  . amoxicillin (AMOXIL) 500 MG capsule    Sig: Take 1 capsule (500 mg total) by mouth 3 (three) times daily.    Dispense:  30 capsule    Refill:  0    Follow-up:  Return if symptoms worsen or fail to improve, for Follow-up hypertension with PCP.Marland Kitchen   Arnoldo Morale MD

## 2015-03-16 NOTE — Progress Notes (Signed)
Follow up on her bronchitis Complains of left eye discomfort Reports no pain Blood pressure is 159/103 did not take her medication this am Patient asking about annual colonsocopy

## 2015-03-16 NOTE — Patient Instructions (Signed)

## 2015-03-17 ENCOUNTER — Telehealth: Payer: Self-pay | Admitting: *Deleted

## 2015-03-17 NOTE — Telephone Encounter (Signed)
-----   Message from Arnoldo Morale, MD sent at 03/16/2015 10:53 PM EDT ----- Please inform the patient that labs are normal. Thank you.

## 2015-03-17 NOTE — Telephone Encounter (Signed)
Left HIPAA compliant message for patient to return RN call at [phone] 

## 2015-03-18 ENCOUNTER — Telehealth: Payer: Self-pay | Admitting: *Deleted

## 2015-03-18 NOTE — Telephone Encounter (Signed)
Left HIPAA compliant message for patient to return RN call at [phone] 

## 2015-03-18 NOTE — Telephone Encounter (Signed)
-----   Message from Arnoldo Morale, MD sent at 03/16/2015 10:53 PM EDT ----- Please inform the patient that labs are normal. Thank you.

## 2015-03-19 ENCOUNTER — Telehealth: Payer: Self-pay | Admitting: *Deleted

## 2015-03-19 NOTE — Telephone Encounter (Signed)
Returned patient call with no answer.  Will mail letter to patient with normal lab results.

## 2015-03-22 NOTE — Telephone Encounter (Signed)
Patient called in to get lab results-left message that I had mailed a letter to her home with the information.

## 2015-04-05 ENCOUNTER — Ambulatory Visit
Admission: RE | Admit: 2015-04-05 | Discharge: 2015-04-05 | Disposition: A | Discharge status: Home / Self Care | Payer: No Typology Code available for payment source | Source: Ambulatory Visit | Attending: Internal Medicine | Admitting: Internal Medicine

## 2015-04-05 DIAGNOSIS — Z0000 Encounter for general adult medical examination without abnormal findings: Secondary | ICD-10-CM

## 2015-04-21 ENCOUNTER — Telehealth: Payer: Self-pay | Admitting: *Deleted

## 2015-04-21 NOTE — Telephone Encounter (Signed)
I re send it again cause she didn't have it cafa ([employer]) before

## 2015-04-21 NOTE — Telephone Encounter (Signed)
-----   Message from Tresa Garter, MD sent at 04/16/2015  6:27 PM EDT ----- Please inform patient that her screening mammogram shows no evidence of malignancy.

## 2015-04-21 NOTE — Telephone Encounter (Signed)
Pt. Returned call. Please f/u with pt. °

## 2015-04-21 NOTE — Telephone Encounter (Signed)
Patient verified DOB Patient made aware of her screening showing no evidence of malignancy. Patient expressed her understanding. Patient had a question about colonoscopy referral. Medical Assistant is able to see the order from 12/07/14. Order states still needing authorization. Medical Assistant informed patient of order being placed and the process in scheduling referral appointments. Medical Assistant will contact referral specialist and notate what step of the process the referral is in.

## 2015-04-21 NOTE — Telephone Encounter (Signed)
Medical Assistant left message on patient's home and cell voicemail. Voicemail states to give a call back to Kanna Dafoe with [facility] at [phone].  

## 2015-04-30 ENCOUNTER — Ambulatory Visit (HOSPITAL_COMMUNITY): Payer: No Typology Code available for payment source

## 2015-06-24 ENCOUNTER — Telehealth: Payer: Self-pay | Admitting: Internal Medicine

## 2015-06-24 ENCOUNTER — Other Ambulatory Visit: Payer: Self-pay | Admitting: Internal Medicine

## 2015-06-24 NOTE — Telephone Encounter (Signed)
Medication refill and Advise. Patient is feeling dizzy and strange and has chalk mouth and does not know why or what to do. Please follow up with patient for advise. Also needs a refill on potassium chloride (K-DUR) 10 MEQ tablet

## 2015-06-29 NOTE — Telephone Encounter (Signed)
MEDICATION REFILL hydrochlorothiazide (HYDRODIURIL) 12.5 MG tablet metoprolol succinate (TOPROL-XL) 25 MG 24 hr tablet potassium chloride (K-DUR) 10 MEQ tablet And another one that is for Acid Reflux.

## 2015-07-01 ENCOUNTER — Ambulatory Visit: Payer: Self-pay | Attending: Family Medicine | Admitting: Family Medicine

## 2015-07-01 ENCOUNTER — Encounter: Payer: Self-pay | Admitting: Family Medicine

## 2015-07-01 VITALS — BP 131/82 | HR 80 | Temp 98.5°F | Resp 18 | Ht 64.5 in | Wt 161.0 lb

## 2015-07-01 DIAGNOSIS — I10 Essential (primary) hypertension: Secondary | ICD-10-CM

## 2015-07-01 DIAGNOSIS — R7303 Prediabetes: Secondary | ICD-10-CM

## 2015-07-01 DIAGNOSIS — Z0000 Encounter for general adult medical examination without abnormal findings: Secondary | ICD-10-CM

## 2015-07-01 DIAGNOSIS — Z1211 Encounter for screening for malignant neoplasm of colon: Secondary | ICD-10-CM

## 2015-07-01 DIAGNOSIS — E876 Hypokalemia: Secondary | ICD-10-CM | POA: Insufficient documentation

## 2015-07-01 DIAGNOSIS — Z23 Encounter for immunization: Secondary | ICD-10-CM | POA: Insufficient documentation

## 2015-07-01 LAB — GLUCOSE, POCT (MANUAL RESULT ENTRY): POC Glucose: 101 mg/dl — AB (ref 70–99)

## 2015-07-01 NOTE — Telephone Encounter (Signed)
Pt. Is aware she is not going to receive a refill on potassium chloride, due to her lab results being normal.

## 2015-07-01 NOTE — Progress Notes (Signed)
   Subjective:    Patient ID: Emily Clarke, female    DOB: [birth year], 64 y.o.   MRN: [id number]  HPI Patient here for follow up on HTN; she repors that she feels out of sorts when on KCl, which she has been prescribed for hypokalemia associated with HCTZ.  She continues to take HCTZ but does not like the associated urinary side effects. No dysuria.  Ran out of KCl a week ago.   Reports that she had screening colonoscopy 12 years ago, reportedly normal.  Not insured now, would like to get into wait list for screening colonoscopy for uninsured patients.  No blood in stool, no changes in bowel habits, no family hx of CRC.    Review of Systems No fevers chills, no bowel changes or urinary habit changes. No reflux sxs. Reviewed med list and updated.     Objective:   Physical Exam  Well appearing, no distrss HEENT Neck supple, no cervical adenopathy. Moist mucus membranes COR Regular S1S2, no extra sounds PULM Clear bilaterally.  ABD Soft, nontender. No tenderness in epigastric region.       Assessment & Plan:

## 2015-07-01 NOTE — Assessment & Plan Note (Signed)
Patient with history HTN and treated with HCTZ 12.5mg  daily and Toprol XL 25mg  daily. She has had hypokalemia associated with HCTZ, feels out-of-sorts with the KCl and would like to avoid. Ran out 1 week ago. To recheck BMet today and see if K is low; if so, then to hold HCTZ and recheck BP with RN visit in 2-3 weeks to see if necessary. May consider increase Toprol XL dose if BP not controlled off the HCTZ.

## 2015-07-01 NOTE — Patient Instructions (Signed)
It was a pleasure to see you today.   We will check your potassium today; if it is low, we will HOLD THE HCTZ and recheck your Blood Pressure with a nurse visit in our office in the coming 2-3 weeks. If it is normal, we will continue your current medications.   I placed an order for Gastroenterology for screening colonoscopy.

## 2015-07-01 NOTE — Progress Notes (Signed)
Patient here for FU HTN  Patient denies pain at this time.  Patient would like flu shot.

## 2015-07-01 NOTE — Telephone Encounter (Signed)
Per [facility] pharmacy patient has refills on hydrochlorothiazide, metoprolol and pantoprazole.  Nurse called patient, reached voicemail. Left message for patient to call Aurthur Wingerter with [facility], at [phone]. Nurse called patient to make patient aware of medication having refills at pharmacy and pharmacy will refill hydrochlorothiazide, metoprolol and pantoprazole. Nurse did not refill potassium chloride due to last potassium lab results were normal.  Patient needs appointment to follow up HTN.

## 2015-07-02 ENCOUNTER — Telehealth: Payer: Self-pay

## 2015-07-02 LAB — BASIC METABOLIC PANEL
BUN: 11 mg/dL (ref 7–25)
CO2: 29 mmol/L (ref 20–31)
Calcium: 9.6 mg/dL (ref 8.6–10.4)
Chloride: 100 mmol/L (ref 98–110)
Creat: 0.8 mg/dL (ref 0.50–0.99)
Glucose, Bld: 98 mg/dL (ref 65–99)
Potassium: 4.1 mmol/L (ref 3.5–5.3)
Sodium: 138 mmol/L (ref 135–146)

## 2015-07-02 LAB — MICROALBUMIN / CREATININE URINE RATIO
Creatinine, Urine: 99 mg/dL (ref 20–320)
Microalb Creat Ratio: 6 mcg/mg creat (ref <30)
Microalb, Ur: 0.6 mg/dL

## 2015-07-02 NOTE — Telephone Encounter (Signed)
-----   Message from Willeen Niece, MD sent at 07/02/2015  8:03 AM EST ----- Please let pt know her potassium level is completely normal-- she may remain on the same blood pressure medicines (HCTZ, toprol xl) without potassium supplementation.  Thanks,  JB

## 2015-07-02 NOTE — Telephone Encounter (Signed)
Nurse called patient, reached voicemail. Left message for patient to call Emily Clarke with [facility], at [phone]. Nurse called patient to make patient aware of her potassium level is completely normal, patient may remain on same BP medicines (HCTZ, toprol xl) without potassium supplement.

## 2015-07-04 HISTORY — PX: COLONOSCOPY: SHX174

## 2015-07-04 HISTORY — PX: POLYPECTOMY: SHX149

## 2015-07-06 NOTE — Telephone Encounter (Signed)
Nurse called patient, patient verified date of birth. Patient aware of potassium level is normal. Patient agrees to continue HCTZ and toprol xl without potassium supplement. Potassium has already been sent to [facility] pharmacy for refill. Nurse called [facility] pharmacy to make them aware of patient not needing potassium.  [facility] pharmacy will discontinue potassium at this time.

## 2015-07-06 NOTE — Telephone Encounter (Signed)
-----   Message from Willeen Niece, MD sent at 07/02/2015  8:03 AM EST ----- Please let pt know her potassium level is completely normal-- she may remain on the same blood pressure medicines (HCTZ, toprol xl) without potassium supplementation.  Thanks,  JB

## 2015-08-23 ENCOUNTER — Emergency Department (HOSPITAL_COMMUNITY): Payer: No Typology Code available for payment source

## 2015-08-23 ENCOUNTER — Emergency Department (HOSPITAL_COMMUNITY)
Admission: EM | Admit: 2015-08-23 | Discharge: 2015-08-23 | Disposition: A | Discharge status: Home / Self Care | Payer: No Typology Code available for payment source | Attending: Emergency Medicine | Admitting: Emergency Medicine

## 2015-08-23 ENCOUNTER — Other Ambulatory Visit: Payer: Self-pay

## 2015-08-23 ENCOUNTER — Encounter (HOSPITAL_COMMUNITY): Payer: Self-pay

## 2015-08-23 DIAGNOSIS — J40 Bronchitis, not specified as acute or chronic: Secondary | ICD-10-CM

## 2015-08-23 DIAGNOSIS — Z8739 Personal history of other diseases of the musculoskeletal system and connective tissue: Secondary | ICD-10-CM | POA: Insufficient documentation

## 2015-08-23 DIAGNOSIS — G8929 Other chronic pain: Secondary | ICD-10-CM | POA: Insufficient documentation

## 2015-08-23 DIAGNOSIS — Z79899 Other long term (current) drug therapy: Secondary | ICD-10-CM | POA: Insufficient documentation

## 2015-08-23 DIAGNOSIS — I10 Essential (primary) hypertension: Secondary | ICD-10-CM

## 2015-08-23 DIAGNOSIS — Z7982 Long term (current) use of aspirin: Secondary | ICD-10-CM | POA: Insufficient documentation

## 2015-08-23 DIAGNOSIS — J45909 Unspecified asthma, uncomplicated: Secondary | ICD-10-CM | POA: Insufficient documentation

## 2015-08-23 LAB — I-STAT TROPONIN, ED: Troponin i, poc: 0 ng/mL (ref 0.00–0.08)

## 2015-08-23 LAB — CBC
HCT: 38.8 % (ref 36.0–46.0)
Hemoglobin: 12.6 g/dL (ref 12.0–15.0)
MCH: 28.8 pg (ref 26.0–34.0)
MCHC: 32.5 g/dL (ref 30.0–36.0)
MCV: 88.6 fL (ref 78.0–100.0)
Platelets: 259 10*3/uL (ref 150–400)
RBC: 4.38 MIL/uL (ref 3.87–5.11)
RDW: 14.2 % (ref 11.5–15.5)
WBC: 9.4 10*3/uL (ref 4.0–10.5)

## 2015-08-23 LAB — BASIC METABOLIC PANEL
Anion gap: 10 (ref 5–15)
BUN: <5 mg/dL — ABNORMAL LOW (ref 6–20)
CO2: 29 mmol/L (ref 22–32)
Calcium: 9.4 mg/dL (ref 8.9–10.3)
Chloride: 105 mmol/L (ref 101–111)
Creatinine, Ser: 0.65 mg/dL (ref 0.44–1.00)
GFR calc Af Amer: >60 mL/min (ref >60)
GFR calc non Af Amer: >60 mL/min (ref >60)
Glucose, Bld: 97 mg/dL (ref 65–99)
Potassium: 3.4 mmol/L — ABNORMAL LOW (ref 3.5–5.1)
Sodium: 144 mmol/L (ref 135–145)

## 2015-08-23 MED ORDER — AEROCHAMBER PLUS FLO-VU MEDIUM MISC
1.0000 | Freq: Once | Status: DC
  Administered 2015-08-23: 1
  Filled 2015-08-23: qty 1

## 2015-08-23 MED ORDER — HYDROCHLOROTHIAZIDE 12.5 MG PO TABS: 12.5000 mg | ORAL_TABLET | Freq: Every day | ORAL | Status: DC

## 2015-08-23 MED ORDER — METOPROLOL SUCCINATE ER 25 MG PO TB24: 25.0000 mg | ORAL_TABLET | Freq: Every day | ORAL | Status: AC

## 2015-08-23 MED ORDER — ASPIRIN EC 81 MG PO TBEC: 81.0000 mg | DELAYED_RELEASE_TABLET | Freq: Every day | ORAL | Status: AC

## 2015-08-23 MED ORDER — ALBUTEROL SULFATE HFA 108 (90 BASE) MCG/ACT IN AERS
2.0000 | INHALATION_SPRAY | RESPIRATORY_TRACT | Status: DC | PRN
  Administered 2015-08-23: 2 via RESPIRATORY_TRACT
  Filled 2015-08-23: qty 6.7

## 2015-08-23 MED ORDER — AEROCHAMBER PLUS W/MASK MISC: 1.0000 | Freq: Once | Status: DC

## 2015-08-23 MED ORDER — ALBUTEROL SULFATE (2.5 MG/3ML) 0.083% IN NEBU
5.0000 mg | INHALATION_SOLUTION | Freq: Once | RESPIRATORY_TRACT | Status: AC
  Administered 2015-08-23: 5 mg via RESPIRATORY_TRACT

## 2015-08-23 MED ORDER — ALBUTEROL SULFATE (2.5 MG/3ML) 0.083% IN NEBU
INHALATION_SOLUTION | RESPIRATORY_TRACT | Status: AC
  Filled 2015-08-23: qty 6

## 2015-08-23 MED ORDER — PANTOPRAZOLE SODIUM 40 MG PO TBEC: 40.0000 mg | DELAYED_RELEASE_TABLET | Freq: Every day | ORAL | Status: DC

## 2015-08-23 NOTE — ED Provider Notes (Signed)
CSN: [id number]     Arrival date & time 08/23/15  [id number] History   First MD Initiated Contact with Patient 08/23/15 [id number]     Chief Complaint  Patient presents with  . Cough  . Chest Pain     (Consider location/radiation/quality/duration/timing/severity/associated sxs/prior Treatment) HPI   65 year old female who presents today complaining of cough productive of some tan sputum present for 3 days. Said some nasal congestion. She has some pain with coughing. The pain is sharp in the right rib area. Denies any other pain. She states that she improved here after she was given a breathing treatment. She does have some history of asthma. She denies fever, chills, dyspnea, leg swelling, history of PE or DVT. She is out of her medications.  Past Medical History  Diagnosis Date  . Tachycardia   . Hypertension   . Asthma   . SVT (supraventricular tachycardia) ([place])   . Headache   . Chronic back pain   . Rotator cuff syndrome of left shoulder    Past Surgical History  Procedure Laterality Date  . Abdominal hysterectomy    . Cholecystectomy    . Tubal ligation     Family History  Problem Relation Age of Onset  . Heart disease Mother   . Prostate cancer Father   . Colon polyps Father   . Heart disease Father   . Hyperlipidemia Father   . Hyperlipidemia Mother   . Heart attack Mother   . Heart attack Father    Social History  Substance Use Topics  . Smoking status: Never Smoker   . Smokeless tobacco: Never Used  . Alcohol Use: No   OB History    Gravida Para Term Preterm AB TAB SAB Ectopic Multiple Living   3 3 3       2      Review of Systems  All other systems reviewed and are negative.     Allergies  Review of patient's allergies indicates no known allergies.  Home Medications   Prior to Admission medications   Medication Sig Start Date End Date Taking? Authorizing Provider  albuterol (PROVENTIL) (2.5 MG/3ML) 0.083% nebulizer solution Take 3 mLs (2.5 mg total) by  nebulization every 6 (six) hours as needed for wheezing or shortness of breath. 03/15/15  Yes Tresa Garter, MD  aspirin EC 81 MG tablet Take 1 tablet (81 mg total) by mouth daily. 03/15/15  Yes Tresa Garter, MD  hydrochlorothiazide (HYDRODIURIL) 12.5 MG tablet Take 1 tablet (12.5 mg total) by mouth daily. 03/15/15  Yes Tresa Garter, MD  metoprolol succinate (TOPROL-XL) 25 MG 24 hr tablet Take 1 tablet (25 mg total) by mouth daily. 03/15/15  Yes Tresa Garter, MD  pantoprazole (PROTONIX) 40 MG tablet Take 40 mg by mouth daily. 07/08/15  Yes Historical Provider, MD   BP 142/100 mmHg  Pulse 84  Temp(Src) 98.3 F (36.8 C) (Oral)  Resp 20  SpO2 95% Physical Exam  Constitutional: She is oriented to person, place, and time. She appears well-developed and well-nourished.  HENT:  Head: Normocephalic and atraumatic.  Right Ear: External ear normal.  Left Ear: External ear normal.  Nose: Nose normal.  Mouth/Throat: Oropharynx is clear and moist.  Eyes: Conjunctivae and EOM are normal. Pupils are equal, round, and reactive to light.  Neck: Normal range of motion. Neck supple.  Cardiovascular: Normal rate, regular rhythm, normal heart sounds and intact distal pulses.   Pulmonary/Chest: Effort normal and breath sounds normal.  Abdominal: Soft.  Bowel sounds are normal.  Musculoskeletal: Normal range of motion.  Neurological: She is alert and oriented to person, place, and time. She has normal reflexes.  Skin: Skin is warm and dry.  Psychiatric: She has a normal mood and affect. Her behavior is normal. Judgment and thought content normal.  Nursing note and vitals reviewed.   ED Course  Procedures (including critical care time) Labs Review Labs Reviewed  BASIC METABOLIC PANEL - Abnormal; Notable for the following:    Potassium 3.4 (*)    BUN <5 (*)    All other components within normal limits  CBC  I-STAT TROPOININ, ED    Imaging Review Dg Chest 2 View  08/23/2015   CLINICAL DATA:  Cough and congestion for 3 days. EXAM: CHEST  2 VIEW COMPARISON:  10/31/2014; 06/03/2014; 10/11/2012 FINDINGS: Grossly unchanged cardiac silhouette and mediastinal contours. Grossly unchanged minimal left basilar linear heterogeneous opacities favored to represent atelectasis or scar. No focal airspace opacities. No pleural effusion or pneumothorax. No evidence of edema. No acute osseus abnormalities. IMPRESSION: No acute cardiopulmonary disease. Electronically Signed   By: Sandi Mariscal M.D.   On: 08/23/2015 09:08   I have personally reviewed and evaluated these images and lab results as part of my medical decision-making.   EKG Interpretation   Date/Time:  Monday August 23 2015 08:27:11 EST Ventricular Rate:  79 PR Interval:  164 QRS Duration: 80 QT Interval:  394 QTC Calculation: 451 R Axis:   53 Text Interpretation:  Normal sinus rhythm Normal ECG Confirmed by Daquisha Clermont MD,  Andee Poles [id number]) on 08/23/2015 10:23:30 AM     Troponin is negative per Crystal in mini lab MDM   Final diagnoses:  Bronchitis    65 year old female history of SVT presents today with cough. Chest x-Valentine Kuechle shows no evidence of acute infiltrate. Patient is not febrile and does not have elevated white blood cell count. She had symptomatic relief with albuterol. She is given albuterol HFA here. I'm refilling her medications she has been out of since Friday. I have discussed diagnosis, return precautions, need for follow-up with patient and she voices understanding.  Pattricia Boss, MD 08/23/15 [phone]

## 2015-08-23 NOTE — ED Notes (Signed)
Patient here with minimal productive cough x 3 days. Complains of chest pain due to the coughing, no distress

## 2015-08-23 NOTE — ED Notes (Signed)
NAD at this time. Pt is stable and going home.  

## 2015-08-23 NOTE — Discharge Instructions (Signed)

## 2015-08-24 ENCOUNTER — Ambulatory Visit: Payer: No Typology Code available for payment source | Admitting: Family Medicine

## 2015-08-24 ENCOUNTER — Encounter (HOSPITAL_COMMUNITY): Payer: Self-pay | Admitting: Emergency Medicine

## 2015-08-24 ENCOUNTER — Emergency Department (INDEPENDENT_AMBULATORY_CARE_PROVIDER_SITE_OTHER)
Admission: EM | Admit: 2015-08-24 | Discharge: 2015-08-24 | Disposition: A | Discharge status: Home / Self Care | Payer: BLUE CROSS/BLUE SHIELD | Source: Home / Self Care | Attending: Family Medicine | Admitting: Family Medicine

## 2015-08-24 DIAGNOSIS — J209 Acute bronchitis, unspecified: Secondary | ICD-10-CM | POA: Diagnosis not present

## 2015-08-24 MED ORDER — PREDNISONE 50 MG PO TABS: ORAL_TABLET | ORAL | Status: DC

## 2015-08-24 MED ORDER — IPRATROPIUM-ALBUTEROL 0.5-2.5 (3) MG/3ML IN SOLN
3.0000 mL | Freq: Once | RESPIRATORY_TRACT | Status: AC
  Administered 2015-08-24: 3 mL via RESPIRATORY_TRACT

## 2015-08-24 MED ORDER — IPRATROPIUM-ALBUTEROL 0.5-2.5 (3) MG/3ML IN SOLN
RESPIRATORY_TRACT | Status: AC
  Filled 2015-08-24: qty 3

## 2015-08-24 NOTE — ED Notes (Signed)
Patient complains of sob, uri, cough and congestion.  Hot flashes/chills.

## 2015-08-24 NOTE — Discharge Instructions (Signed)

## 2015-08-25 NOTE — ED Provider Notes (Signed)
CSN: [id number]     Arrival date & time 08/24/15  1811 History   First MD Initiated Contact with Patient 08/24/15 2043     Chief Complaint  Patient presents with  . Shortness of Breath  . URI   (Consider location/radiation/quality/duration/timing/severity/associated sxs/prior Treatment) HPI Cough wheezing, pt was seen in ED just one day before, workup was neg including cxr. Treated with albuterol. Pt states cough today is really bad, unable to stop coughing and wheezing is worse. Dx with asthma, but pt denies history of asthma in the past Past Medical History  Diagnosis Date  . Tachycardia   . Hypertension   . Asthma   . SVT (supraventricular tachycardia) ([place])   . Headache   . Chronic back pain   . Rotator cuff syndrome of left shoulder    Past Surgical History  Procedure Laterality Date  . Abdominal hysterectomy    . Cholecystectomy    . Tubal ligation     Family History  Problem Relation Age of Onset  . Heart disease Mother   . Prostate cancer Father   . Colon polyps Father   . Heart disease Father   . Hyperlipidemia Father   . Hyperlipidemia Mother   . Heart attack Mother   . Heart attack Father    Social History  Substance Use Topics  . Smoking status: Never Smoker   . Smokeless tobacco: Never Used  . Alcohol Use: No   OB History    Gravida Para Term Preterm AB TAB SAB Ectopic Multiple Living   3 3 3       2      Review of Systems ROS +'ve cough, wheezing  Denies: HEADACHE, NAUSEA, ABDOMINAL PAIN, CHEST PAIN, CONGESTION, DYSURIA, SHORTNESS OF BREATH  Allergies  Review of patient's allergies indicates no known allergies.  Home Medications   Prior to Admission medications   Medication Sig Start Date End Date Taking? Authorizing Provider  albuterol (PROVENTIL) (2.5 MG/3ML) 0.083% nebulizer solution Take 3 mLs (2.5 mg total) by nebulization every 6 (six) hours as needed for wheezing or shortness of breath. 03/15/15   Tresa Garter, MD  aspirin EC 81  MG tablet Take 1 tablet (81 mg total) by mouth daily. 08/23/15   Pattricia Boss, MD  hydrochlorothiazide (HYDRODIURIL) 12.5 MG tablet Take 1 tablet (12.5 mg total) by mouth daily. 08/23/15   Pattricia Boss, MD  metoprolol succinate (TOPROL-XL) 25 MG 24 hr tablet Take 1 tablet (25 mg total) by mouth daily. 08/23/15   Pattricia Boss, MD  pantoprazole (PROTONIX) 40 MG tablet Take 1 tablet (40 mg total) by mouth daily. 08/23/15   Pattricia Boss, MD  predniSONE (DELTASONE) 50 MG tablet 1 tablet daily for 5 days 08/24/15   Konrad Felix, PA   Meds Ordered and Administered this Visit   Medications  ipratropium-albuterol (DUONEB) 0.5-2.5 (3) MG/3ML nebulizer solution 3 mL (3 mLs Nebulization Given 08/24/15 2028)    BP 142/87 mmHg  Pulse 83  Temp(Src) 98.1 F (36.7 C) (Oral)  Resp 16  SpO2 96% No data found.   Physical Exam  Constitutional: She is oriented to person, place, and time. She appears well-developed and well-nourished. She appears distressed.  HENT:  Head: Normocephalic and atraumatic.  Cardiovascular: Normal rate and normal heart sounds.   Pulmonary/Chest: Effort normal. She has wheezes.  bronchospasm  Musculoskeletal: Normal range of motion.  Neurological: She is alert and oriented to person, place, and time.  Skin: Skin is warm and dry.  Nursing note  and vitals reviewed.   ED Course  Procedures (including critical care time)  Labs Review Labs Reviewed - No data to display  Imaging Review No results found.   Visual Acuity Review  Right Eye Distance:   Left Eye Distance:   Bilateral Distance:    Right Eye Near:   Left Eye Near:    Bilateral Near:        Nebulizer treatment provided and pat states she feels the best in the last couple of days. Will add prednisone to her regimen.   Also discussed how to use inhaler and neb at home and when to return to the ED MDM   1. Bronchospasm with bronchitis, acute    Patient is advised to continue home symptomatic  treatment. Prescription for prednisone  sent pharmacy patient has indicated. Patient is advised that if there are new or worsening symptoms or attend the emergency department, or contact primary care provider. Instructions of care provided discharged home in stable condition. Return to work/school note provided.  THIS NOTE WAS GENERATED USING A VOICE RECOGNITION SOFTWARE PROGRAM. ALL REASONABLE EFFORTS  WERE MADE TO PROOFREAD THIS DOCUMENT FOR ACCURACY.     Konrad Felix, PA 08/25/15 1012

## 2015-08-31 ENCOUNTER — Ambulatory Visit: Payer: BLUE CROSS/BLUE SHIELD | Attending: Family Medicine | Admitting: Family Medicine

## 2015-08-31 ENCOUNTER — Encounter: Payer: Self-pay | Admitting: Family Medicine

## 2015-08-31 VITALS — BP 147/96 | HR 62 | Temp 98.3°F | Resp 15 | Ht 64.5 in | Wt 164.2 lb

## 2015-08-31 DIAGNOSIS — R7303 Prediabetes: Secondary | ICD-10-CM

## 2015-08-31 DIAGNOSIS — Z7982 Long term (current) use of aspirin: Secondary | ICD-10-CM | POA: Insufficient documentation

## 2015-08-31 DIAGNOSIS — J069 Acute upper respiratory infection, unspecified: Secondary | ICD-10-CM

## 2015-08-31 DIAGNOSIS — I10 Essential (primary) hypertension: Secondary | ICD-10-CM

## 2015-08-31 DIAGNOSIS — Z1211 Encounter for screening for malignant neoplasm of colon: Secondary | ICD-10-CM | POA: Diagnosis not present

## 2015-08-31 DIAGNOSIS — B9789 Other viral agents as the cause of diseases classified elsewhere: Secondary | ICD-10-CM

## 2015-08-31 DIAGNOSIS — Z79899 Other long term (current) drug therapy: Secondary | ICD-10-CM | POA: Insufficient documentation

## 2015-08-31 LAB — POCT GLYCOSYLATED HEMOGLOBIN (HGB A1C): Hemoglobin A1C: 5.8

## 2015-08-31 MED ORDER — ALBUTEROL SULFATE HFA 108 (90 BASE) MCG/ACT IN AERS: 2.0000 | INHALATION_SPRAY | Freq: Four times a day (QID) | RESPIRATORY_TRACT | Status: DC | PRN

## 2015-08-31 MED ORDER — ALBUTEROL SULFATE (2.5 MG/3ML) 0.083% IN NEBU: 2.5000 mg | INHALATION_SOLUTION | Freq: Four times a day (QID) | RESPIRATORY_TRACT | Status: DC | PRN

## 2015-08-31 MED ORDER — AMLODIPINE BESYLATE 5 MG PO TABS: 5.0000 mg | ORAL_TABLET | Freq: Every day | ORAL | Status: DC

## 2015-08-31 NOTE — Patient Instructions (Signed)
Hypertension Hypertension, commonly called high blood pressure, is when the force of blood pumping through your arteries is too strong. Your arteries are the blood vessels that carry blood from your heart throughout your body. A blood pressure reading consists of a higher number over a lower number, such as 110/72. The higher number (systolic) is the pressure inside your arteries when your heart pumps. The lower number (diastolic) is the pressure inside your arteries when your heart relaxes. Ideally you want your blood pressure below 120/80. Hypertension forces your heart to work harder to pump blood. Your arteries may become narrow or stiff. Having untreated or uncontrolled hypertension can cause heart attack, stroke, kidney disease, and other problems. RISK FACTORS Some risk factors for high blood pressure are controllable. Others are not.  Risk factors you cannot control include:   Race. You may be at higher risk if you are African American.  Age. Risk increases with age.  Gender. Men are at higher risk than women before age 45 years. After age 65, women are at higher risk than men. Risk factors you can control include:  Not getting enough exercise or physical activity.  Being overweight.  Getting too much fat, sugar, calories, or salt in your diet.  Drinking too much alcohol. SIGNS AND SYMPTOMS Hypertension does not usually cause signs or symptoms. Extremely high blood pressure (hypertensive crisis) may cause headache, anxiety, shortness of breath, and nosebleed. DIAGNOSIS To check if you have hypertension, your health care provider will measure your blood pressure while you are seated, with your arm held at the level of your heart. It should be measured at least twice using the same arm. Certain conditions can cause a difference in blood pressure between your right and left arms. A blood pressure reading that is higher than normal on one occasion does not mean that you need treatment. If  it is not clear whether you have high blood pressure, you may be asked to return on a different day to have your blood pressure checked again. Or, you may be asked to monitor your blood pressure at home for 1 or more weeks. TREATMENT Treating high blood pressure includes making lifestyle changes and possibly taking medicine. Living a healthy lifestyle can help lower high blood pressure. You may need to change some of your habits. Lifestyle changes may include:  Following the DASH diet. This diet is high in fruits, vegetables, and whole grains. It is low in salt, red meat, and added sugars.  Keep your sodium intake below 2,300 mg per day.  Getting at least 30-45 minutes of aerobic exercise at least 4 times per week.  Losing weight if necessary.  Not smoking.  Limiting alcoholic beverages.  Learning ways to reduce stress. Your health care provider may prescribe medicine if lifestyle changes are not enough to get your blood pressure under control, and if one of the following is true:  You are 18-59 years of age and your systolic blood pressure is above 140.  You are 60 years of age or older, and your systolic blood pressure is above 150.  Your diastolic blood pressure is above 90.  You have diabetes, and your systolic blood pressure is over 140 or your diastolic blood pressure is over 90.  You have kidney disease and your blood pressure is above 140/90.  You have heart disease and your blood pressure is above 140/90. Your personal target blood pressure may vary depending on your medical conditions, your age, and other factors. HOME CARE INSTRUCTIONS    Have your blood pressure rechecked as directed by your health care provider.   Take medicines only as directed by your health care provider. Follow the directions carefully. Blood pressure medicines must be taken as prescribed. The medicine does not work as well when you skip doses. Skipping doses also puts you at risk for  problems.  Do not smoke.   Monitor your blood pressure at home as directed by your health care provider. SEEK MEDICAL CARE IF:   You think you are having a reaction to medicines taken.  You have recurrent headaches or feel dizzy.  You have swelling in your ankles.  You have trouble with your vision. SEEK IMMEDIATE MEDICAL CARE IF:  You develop a severe headache or confusion.  You have unusual weakness, numbness, or feel faint.  You have severe chest or abdominal pain.  You vomit repeatedly.  You have trouble breathing. MAKE SURE YOU:   Understand these instructions.  Will watch your condition.  Will get help right away if you are not doing well or get worse.   This information is not intended to replace advice given to you by your health care provider. Make sure you discuss any questions you have with your health care provider.   Document Released: 06/19/2005 Document Revised: 11/03/2014 Document Reviewed: 04/11/2013 Elsevier Interactive Patient Education 2016 Else[employer] Inc.  

## 2015-08-31 NOTE — Progress Notes (Signed)
Subjective:  Patient ID: Emily Clarke, female    DOB: May 13, 1951  Age: 65 y.o. MRN: [id number]  CC: Follow-up   HPI Emily Clarke easily 65 year old female with a history of hypertension, prediabetes (A1c 5.8) who comes into the clinic for a follow-up from an ED visit where she was treated for bronchitis, chest x-ray was negative for acute cardiopulmonary disease.  She received prednisone, albuterol nebulizer and MDI and reports improvement in symptoms and she has completed her course of prednisone. She is requesting a refill of albuterol. She gives a remote history of asthma several years ago but has never been on medications for it.  Blood pressure is mildly elevated and she has not been taking her hydrochlorothiazide due to frequent bathroom trips; she is also requesting a referral for colonoscopy.  Outpatient Prescriptions Prior to Visit  Medication Sig Dispense Refill  . aspirin EC 81 MG tablet Take 1 tablet (81 mg total) by mouth daily. 30 tablet 2  . metoprolol succinate (TOPROL-XL) 25 MG 24 hr tablet Take 1 tablet (25 mg total) by mouth daily. 90 tablet 0  . pantoprazole (PROTONIX) 40 MG tablet Take 1 tablet (40 mg total) by mouth daily. 30 tablet 0  . albuterol (PROVENTIL) (2.5 MG/3ML) 0.083% nebulizer solution Take 3 mLs (2.5 mg total) by nebulization every 6 (six) hours as needed for wheezing or shortness of breath. 150 mL 1  . hydrochlorothiazide (HYDRODIURIL) 12.5 MG tablet Take 1 tablet (12.5 mg total) by mouth daily. (Patient not taking: Reported on 08/31/2015) 90 tablet 0  . predniSONE (DELTASONE) 50 MG tablet 1 tablet daily for 5 days (Patient not taking: Reported on 08/31/2015) 5 tablet o   No facility-administered medications prior to visit.    ROS Review of Systems  Constitutional: Negative for activity change, appetite change and fatigue.  HENT: Negative for congestion, sinus pressure and sore throat.   Eyes: Negative for visual disturbance.  Respiratory:  Negative for cough, chest tightness, shortness of breath and wheezing.   Cardiovascular: Negative for chest pain and palpitations.  Gastrointestinal: Negative for abdominal pain, constipation and abdominal distention.  Endocrine: Negative for polydipsia.  Genitourinary: Negative for dysuria and frequency.  Musculoskeletal: Negative for back pain and arthralgias.  Skin: Negative for rash.  Neurological: Negative for tremors, light-headedness and numbness.  Hematological: Does not bruise/bleed easily.  Psychiatric/Behavioral: Negative for behavioral problems and agitation.    Objective:  BP 147/96 mmHg  Pulse 62  Temp(Src) 98.3 F (36.8 C)  Resp 15  Ht 5' 4.5" (1.638 m)  Wt 164 lb 3.2 oz (74.481 kg)  BMI 27.76 kg/m2  SpO2 95%  BP/Weight 08/31/2015 08/24/2015 [id number]  Systolic BP [id number]  Diastolic BP 96 87 83  Wt. (Lbs) 164.2 - -  BMI 27.76 - -      Physical Exam  Constitutional: She is oriented to person, place, and time. She appears well-developed and well-nourished.  Cardiovascular: Normal rate, normal heart sounds and intact distal pulses.   No murmur heard. Pulmonary/Chest: Effort normal and breath sounds normal. She has no wheezes. She has no rales. She exhibits no tenderness.  Abdominal: Soft. Bowel sounds are normal. She exhibits no distension and no mass. There is no tenderness.  Musculoskeletal: Normal range of motion.  Neurological: She is alert and oriented to person, place, and time.   CLINICAL DATA: Cough and congestion for 3 days.  EXAM: CHEST 2 VIEW  COMPARISON: 10/31/2014; 06/03/2014; 10/11/2012  FINDINGS: Grossly unchanged cardiac silhouette and mediastinal  contours. Grossly unchanged minimal left basilar linear heterogeneous opacities favored to represent atelectasis or scar. No focal airspace opacities. No pleural effusion or pneumothorax. No evidence of edema. No acute osseus abnormalities.  IMPRESSION: No acute cardiopulmonary  disease.   Electronically Signed  By: Emily Clarke M.D.  On: 08/23/2015 09:08   Assessment & Plan:   1. Pre-diabetes A1c 5.8 Discussed lifestyle modifications to prevent progression 2 diabetes mellitus. - HgB A1c  2. Essential hypertension, benign BP is above [place] less than 140/90 Will switch from hydrochlorothiazide to amlodipine due to complaints of frequent urination. Low-sodium, DASH diet - amLODipine (NORVASC) 5 MG tablet; Take 1 tablet (5 mg total) by mouth daily.  Dispense: 30 tablet; Refill: 3  3. Screening for colon cancer - Ambulatory referral to Gastroenterology  4. Viral URI with cough Improving. Questionable history of asthma. If symptoms return she may benefit from addition of an inhaled corticosteroid. - albuterol (PROVENTIL) (2.5 MG/3ML) 0.083% nebulizer solution; Take 3 mLs (2.5 mg total) by nebulization every 6 (six) hours as needed for wheezing or shortness of breath.  Dispense: 150 mL; Refill: 1 - albuterol (PROVENTIL HFA;VENTOLIN HFA) 108 (90 Base) MCG/ACT inhaler; Inhale 2 puffs into the lungs every 6 (six) hours as needed for wheezing or shortness of breath.  Dispense: 1 Inhaler; Refill: 3   Meds ordered this encounter  Medications  . albuterol (PROVENTIL) (2.5 MG/3ML) 0.083% nebulizer solution    Sig: Take 3 mLs (2.5 mg total) by nebulization every 6 (six) hours as needed for wheezing or shortness of breath.    Dispense:  150 mL    Refill:  1  . albuterol (PROVENTIL HFA;VENTOLIN HFA) 108 (90 Base) MCG/ACT inhaler    Sig: Inhale 2 puffs into the lungs every 6 (six) hours as needed for wheezing or shortness of breath.    Dispense:  1 Inhaler    Refill:  3  . amLODipine (NORVASC) 5 MG tablet    Sig: Take 1 tablet (5 mg total) by mouth daily.    Dispense:  30 tablet    Refill:  3    Discontinue hydrochlorothiazide    Follow-up: Return in about 1 month (around 09/28/2015) for Follow-up of hypertension with PCP-Dr. Doreene Burke.   Arnoldo Morale  MD

## 2015-08-31 NOTE — Progress Notes (Signed)
Patient states bronchitis is improving-she finished her prednisone She needs refill on albuterol inhaler and neb solution She does not take her HCTZ because she does not like how it makes her urinate frequently Patient needs referral to have colonoscopy

## 2015-09-23 ENCOUNTER — Encounter: Payer: Self-pay | Admitting: Internal Medicine

## 2015-09-23 ENCOUNTER — Ambulatory Visit: Payer: BLUE CROSS/BLUE SHIELD | Attending: Internal Medicine | Admitting: Internal Medicine

## 2015-09-23 VITALS — BP 111/72 | HR 70 | Temp 98.7°F | Resp 18 | Ht 64.5 in | Wt 161.8 lb

## 2015-09-23 DIAGNOSIS — G8929 Other chronic pain: Secondary | ICD-10-CM | POA: Insufficient documentation

## 2015-09-23 DIAGNOSIS — M545 Low back pain: Secondary | ICD-10-CM | POA: Insufficient documentation

## 2015-09-23 DIAGNOSIS — R7303 Prediabetes: Secondary | ICD-10-CM | POA: Insufficient documentation

## 2015-09-23 DIAGNOSIS — Z7982 Long term (current) use of aspirin: Secondary | ICD-10-CM | POA: Insufficient documentation

## 2015-09-23 DIAGNOSIS — L918 Other hypertrophic disorders of the skin: Secondary | ICD-10-CM | POA: Diagnosis not present

## 2015-09-23 DIAGNOSIS — J45909 Unspecified asthma, uncomplicated: Secondary | ICD-10-CM | POA: Insufficient documentation

## 2015-09-23 DIAGNOSIS — I10 Essential (primary) hypertension: Secondary | ICD-10-CM | POA: Diagnosis not present

## 2015-09-23 DIAGNOSIS — I471 Supraventricular tachycardia: Secondary | ICD-10-CM | POA: Insufficient documentation

## 2015-09-23 DIAGNOSIS — Z79899 Other long term (current) drug therapy: Secondary | ICD-10-CM | POA: Diagnosis not present

## 2015-09-23 NOTE — Patient Instructions (Signed)
DASH Eating Plan °DASH stands for "Dietary Approaches to Stop Hypertension." The DASH eating plan is a healthy eating plan that has been shown to reduce high blood pressure (hypertension). Additional health benefits may include reducing the risk of type 2 diabetes mellitus, heart disease, and stroke. The DASH eating plan may also help with weight loss. °WHAT DO I NEED TO KNOW ABOUT THE DASH EATING PLAN? °For the DASH eating plan, you will follow these general guidelines: °· Choose foods with a percent daily value for sodium of less than 5% (as listed on the food label). °· Use salt-free seasonings or herbs instead of table salt or sea salt. °· Check with your health care provider or pharmacist before using salt substitutes. °· Eat lower-sodium products, often labeled as "lower sodium" or "no salt added." °· Eat fresh foods. °· Eat more vegetables, fruits, and low-fat dairy products. °· Choose whole grains. Look for the word "whole" as the first word in the ingredient list. °· Choose fish and skinless chicken or turkey more often than red meat. Limit fish, poultry, and meat to 6 oz (170 g) each day. °· Limit sweets, desserts, sugars, and sugary drinks. °· Choose heart-healthy fats. °· Limit cheese to 1 oz (28 g) per day. °· Eat more home-cooked food and less restaurant, buffet, and fast food. °· Limit fried foods. °· Cook foods using methods other than frying. °· Limit canned vegetables. If you do use them, rinse them well to decrease the sodium. °· When eating at a restaurant, ask that your food be prepared with less salt, or no salt if possible. °WHAT FOODS CAN I EAT? °Seek help from a dietitian for individual calorie needs. °Grains °Whole grain or whole wheat bread. Brown rice. Whole grain or whole wheat pasta. Quinoa, bulgur, and whole grain cereals. Low-sodium cereals. Corn or whole wheat flour tortillas. Whole grain cornbread. Whole grain crackers. Low-sodium crackers. °Vegetables °Fresh or frozen vegetables  (raw, steamed, roasted, or grilled). Low-sodium or reduced-sodium tomato and vegetable juices. Low-sodium or reduced-sodium tomato sauce and paste. Low-sodium or reduced-sodium canned vegetables.  °Fruits °All fresh, canned (in natural juice), or frozen fruits. °Meat and Other Protein Products °Ground beef (85% or leaner), grass-fed beef, or beef trimmed of fat. Skinless chicken or turkey. Ground chicken or turkey. Pork trimmed of fat. All fish and seafood. Eggs. Dried beans, peas, or lentils. Unsalted nuts and seeds. Unsalted canned beans. °Dairy °Low-fat dairy products, such as skim or 1% milk, 2% or reduced-fat cheeses, low-fat ricotta or cottage cheese, or plain low-fat yogurt. Low-sodium or reduced-sodium cheeses. °Fats and Oils °Tub margarines without trans fats. Light or reduced-fat mayonnaise and salad dressings (reduced sodium). Avocado. Safflower, olive, or canola oils. Natural peanut or almond butter. °Other °Unsalted popcorn and pretzels. °The items listed above may not be a complete list of recommended foods or beverages. Contact your dietitian for more options. °WHAT FOODS ARE NOT RECOMMENDED? °Grains °White bread. White pasta. White rice. Refined cornbread. Bagels and croissants. Crackers that contain trans fat. °Vegetables °Creamed or fried vegetables. Vegetables in a cheese sauce. Regular canned vegetables. Regular canned tomato sauce and paste. Regular tomato and vegetable juices. °Fruits °Dried fruits. Canned fruit in light or heavy syrup. Fruit juice. °Meat and Other Protein Products °Fatty cuts of meat. Ribs, chicken wings, bacon, sausage, bologna, salami, chitterlings, fatback, hot dogs, bratwurst, and packaged luncheon meats. Salted nuts and seeds. Canned beans with salt. °Dairy °Whole or 2% milk, cream, half-and-half, and cream cheese. Whole-fat or sweetened yogurt. Full-fat   cheeses or blue cheese. Nondairy creamers and whipped toppings. Processed cheese, cheese spreads, or cheese  curds. °Condiments °Onion and garlic salt, seasoned salt, table salt, and sea salt. Canned and packaged gravies. Worcestershire sauce. Tartar sauce. Barbecue sauce. Teriyaki sauce. Soy sauce, including reduced sodium. Steak sauce. Fish sauce. Oyster sauce. Cocktail sauce. Horseradish. Ketchup and mustard. Meat flavorings and tenderizers. Bouillon cubes. Hot sauce. Tabasco sauce. Marinades. Taco seasonings. Relishes. °Fats and Oils °Butter, stick margarine, lard, shortening, ghee, and bacon fat. Coconut, palm kernel, or palm oils. Regular salad dressings. °Other °Pickles and olives. Salted popcorn and pretzels. °The items listed above may not be a complete list of foods and beverages to avoid. Contact your dietitian for more information. °WHERE CAN I FIND MORE INFORMATION? °National [employer], [employer], [employer]: www.nhlbi.nih.gov/health/health-topics/topics/dash/ °  °This information is not intended to replace advice given to you by your health care provider. Make sure you discuss any questions you have with your health care provider. °  °Document Released: 06/08/2011 Document Revised: 07/10/2014 Document Reviewed: 04/23/2013 °Elsevier Interactive Patient Education ©2016 [employer] Inc[employer] ° °Hypertension °Hypertension, commonly called high blood pressure, is when the force of blood pumping through your arteries is too strong. Your arteries are the blood vessels that carry blood from your heart throughout your body. A blood pressure reading consists of a higher number over a lower number, such as 110/72. The higher number (systolic) is the pressure inside your arteries when your heart pumps. The lower number (diastolic) is the pressure inside your arteries when your heart relaxes. Ideally you want your blood pressure below 120/80. °Hypertension forces your heart to work harder to pump blood. Your arteries may become narrow or stiff. Having untreated or uncontrolled hypertension can cause heart attack, stroke, kidney  disease, and other problems. °RISK FACTORS °Some risk factors for high blood pressure are controllable. Others are not.  °Risk factors you cannot control include:  °· Race. You may be at higher risk if you are African American. °· Age. Risk increases with age. °· Gender. Men are at higher risk than women before age 45 years. After age 65, women are at higher risk than men. °Risk factors you can control include: °· Not getting enough exercise or physical activity. °· Being overweight. °· Getting too much fat, sugar, calories, or salt in your diet. °· Drinking too much alcohol. °SIGNS AND SYMPTOMS °Hypertension does not usually cause signs or symptoms. Extremely high blood pressure (hypertensive crisis) may cause headache, anxiety, shortness of breath, and nosebleed. °DIAGNOSIS °To check if you have hypertension, your health care provider will measure your blood pressure while you are seated, with your arm held at the level of your heart. It should be measured at least twice using the same arm. Certain conditions can cause a difference in blood pressure between your right and left arms. A blood pressure reading that is higher than normal on one occasion does not mean that you need treatment. If it is not clear whether you have high blood pressure, you may be asked to return on a different day to have your blood pressure checked again. Or, you may be asked to monitor your blood pressure at home for 1 or more weeks. °TREATMENT °Treating high blood pressure includes making lifestyle changes and possibly taking medicine. Living a healthy lifestyle can help lower high blood pressure. You may need to change some of your habits. °Lifestyle changes may include: °· Following the DASH diet. This diet is high in fruits, vegetables, and whole   grains. It is low in salt, red meat, and added sugars. °· Keep your sodium intake below 2,300 mg per day. °· Getting at least 30-45 minutes of aerobic exercise at least 4 times per  week. °· Losing weight if necessary. °· Not smoking. °· Limiting alcoholic beverages. °· Learning ways to reduce stress. °Your health care provider may prescribe medicine if lifestyle changes are not enough to get your blood pressure under control, and if one of the following is true: °· You are 18-59 years of age and your systolic blood pressure is above 140. °· You are 60 years of age or older, and your systolic blood pressure is above 150. °· Your diastolic blood pressure is above 90. °· You have diabetes, and your systolic blood pressure is over 140 or your diastolic blood pressure is over 90. °· You have kidney disease and your blood pressure is above 140/90. °· You have heart disease and your blood pressure is above 140/90. °Your personal target blood pressure may vary depending on your medical conditions, your age, and other factors. °HOME CARE INSTRUCTIONS °· Have your blood pressure rechecked as directed by your health care provider.   °· Take medicines only as directed by your health care provider. Follow the directions carefully. Blood pressure medicines must be taken as prescribed. The medicine does not work as well when you skip doses. Skipping doses also puts you at risk for problems. °· Do not smoke.   °· Monitor your blood pressure at home as directed by your health care provider.  °SEEK MEDICAL CARE IF:  °· You think you are having a reaction to medicines taken. °· You have recurrent headaches or feel dizzy. °· You have swelling in your ankles. °· You have trouble with your vision. °SEEK IMMEDIATE MEDICAL CARE IF: °· You develop a severe headache or confusion. °· You have unusual weakness, numbness, or feel faint. °· You have severe chest or abdominal pain. °· You vomit repeatedly. °· You have trouble breathing. °MAKE SURE YOU:  °· Understand these instructions. °· Will watch your condition. °· Will get help right away if you are not doing well or get worse. °  °This information is not intended to  replace advice given to you by your health care provider. Make sure you discuss any questions you have with your health care provider. °  °Document Released: 06/19/2005 Document Revised: 11/03/2014 Document Reviewed: 04/11/2013 °Elsevier Interactive Patient Education ©2016 Elsevier Inc. ° °

## 2015-09-23 NOTE — Progress Notes (Signed)
Patient is here for FU HTN  Patient denies pain at this time.  Patient has taken her medications this morning and the patient has eaten a banana.

## 2015-09-23 NOTE — Progress Notes (Signed)
Patient ID: KHYLI WEYER, female   DOB: [birth year], 65 y.o.   MRN: [id number]   Emily Clarke, is a 66 y.o. female  [id number]  [id number]  DOB - 1950-09-06  Chief Complaint  Patient presents with  . Follow-up    HTN        Subjective:   Emily Clarke is a 65 y.o. female with history of hypertension, prediabetes, and chronic low back pain here today for a follow up visit of hypertension. Patient recently had a change in her blood pressure medication, and advised to follow-up today. Blood pressure has been controlled. She also wants to be tested for hepatitis because she belongs to the baby boomer generation and she heard from the TV that they're at risk of hepatitis C. She has no complaint today. She does not need refill on her medications. Blood pressure is within normal limits. She is compliant with medications. She has no side effects. Patient has No headache, No chest pain, No abdominal pain - No Nausea, No new weakness tingling or numbness, No Cough - SOB. Patient also wants to be referred to dermatologist for her skin tags, she would like some of them to be removed.  Problem  Cutaneous Skin Tags    ALLERGIES: No Known Allergies  PAST MEDICAL HISTORY: Past Medical History  Diagnosis Date  . Tachycardia   . Hypertension   . Asthma   . SVT (supraventricular tachycardia) ([place])   . Headache   . Chronic back pain   . Rotator cuff syndrome of left shoulder     MEDICATIONS AT HOME: Prior to Admission medications   Medication Sig Start Date End Date Taking? Authorizing Provider  albuterol (PROVENTIL HFA;VENTOLIN HFA) 108 (90 Base) MCG/ACT inhaler Inhale 2 puffs into the lungs every 6 (six) hours as needed for wheezing or shortness of breath. 08/31/15  Yes Arnoldo Morale, MD  albuterol (PROVENTIL) (2.5 MG/3ML) 0.083% nebulizer solution Take 3 mLs (2.5 mg total) by nebulization every 6 (six) hours as needed for wheezing or shortness of breath. 08/31/15  Yes Arnoldo Morale, MD    amLODipine (NORVASC) 5 MG tablet Take 1 tablet (5 mg total) by mouth daily. 08/31/15  Yes Arnoldo Morale, MD  aspirin EC 81 MG tablet Take 1 tablet (81 mg total) by mouth daily. 08/23/15  Yes Pattricia Boss, MD  metoprolol succinate (TOPROL-XL) 25 MG 24 hr tablet Take 1 tablet (25 mg total) by mouth daily. 08/23/15  Yes Pattricia Boss, MD  pantoprazole (PROTONIX) 40 MG tablet Take 1 tablet (40 mg total) by mouth daily. 08/23/15  Yes Pattricia Boss, MD     Objective:   Filed Vitals:   09/23/15 0906  BP: 111/72  Pulse: 70  Temp: 98.7 F (37.1 C)  TempSrc: Oral  Resp: 18  Height: 5' 4.5" (1.638 m)  Weight: 161 lb 12.8 oz (73.392 kg)  SpO2: 97%    Exam General appearance : Awake, alert, not in any distress. Speech Clear. Not toxic looking HEENT: Atraumatic and Normocephalic, pupils equally reactive to light and accomodation Neck: supple, no JVD. No cervical lymphadenopathy.  Chest:Good air entry bilaterally, no added sounds  CVS: S1 S2 regular, no murmurs.  Abdomen: Bowel sounds present, Non tender and not distended with no gaurding, rigidity or rebound. Extremities: B/L Lower Ext shows no edema, both legs are warm to touch Neurology: Awake alert, and oriented X 3, CN II-XII intact, Non focal Skin: Multiple skin tags  Data Review Lab Results  Component Value Date   HGBA1C  5.8 08/31/2015   HGBA1C 5.90 12/07/2014   HGBA1C 5.8 06/29/2014     Assessment & Plan   1. Essential hypertension, benign  We have discussed target BP range and blood pressure goal. I have advised patient to check BP regularly and to call us back or report to clinic if the numbers are consistently higher than 140/90. We discussed the importance of compliance with medical therapy and DASH diet recommended, consequences of uncontrolled hypertension discussed.   - continue current BP medications  2. Cutaneous skin tags  - Ambulatory referral to Dermatology - Acute Hep Panel & Hep B Surface Ab  Patient have been  counseled extensively about nutrition and exercise  Return in about 3 months (around 12/24/2015) for Follow up HTN, Annual Physical.  The patient was given clear instructions to go to ER or return to medical center if symptoms don't improve, worsen or new problems develop. The patient verbalized understanding. The patient was told to call to get lab results if they haven't heard anything in the next week.   This note has been created with Surveyor, quantity. Any transcriptional errors are unintentional.    Angelica Chessman, MD, [place], Karilyn Cota, [place] [facility] [place], [place]   09/23/2015, 9:43 AM

## 2015-09-24 LAB — ACUTE HEP PANEL AND HEP B SURFACE AB
HCV Ab: NEGATIVE
Hep A IgM: NONREACTIVE
Hep B C IgM: NONREACTIVE
Hep B S Ab: POSITIVE — AB
Hepatitis B Surface Ag: NEGATIVE

## 2015-09-30 ENCOUNTER — Ambulatory Visit: Payer: No Typology Code available for payment source | Admitting: Internal Medicine

## 2015-09-30 ENCOUNTER — Telehealth: Payer: Self-pay | Admitting: *Deleted

## 2015-09-30 NOTE — Telephone Encounter (Signed)
-----   Message from Tresa Garter, MD sent at 09/24/2015 11:10 AM EDT ----- Please inform patient that her hepatitis panel is negative including Hepatitis C

## 2015-09-30 NOTE — Telephone Encounter (Signed)
Patient verified DOB Patient is aware of hepatitis panel including hepatitis C being negative. Patient expressed her understanding and had no further questions at this time.

## 2015-10-24 ENCOUNTER — Encounter (HOSPITAL_COMMUNITY): Payer: Self-pay | Admitting: Emergency Medicine

## 2015-10-24 ENCOUNTER — Emergency Department (HOSPITAL_COMMUNITY)
Admission: EM | Admit: 2015-10-24 | Discharge: 2015-10-24 | Disposition: A | Discharge status: Home / Self Care | Payer: BLUE CROSS/BLUE SHIELD | Attending: Emergency Medicine | Admitting: Emergency Medicine

## 2015-10-24 ENCOUNTER — Emergency Department (HOSPITAL_COMMUNITY): Payer: BLUE CROSS/BLUE SHIELD

## 2015-10-24 DIAGNOSIS — Z7982 Long term (current) use of aspirin: Secondary | ICD-10-CM | POA: Insufficient documentation

## 2015-10-24 DIAGNOSIS — R05 Cough: Secondary | ICD-10-CM | POA: Diagnosis present

## 2015-10-24 DIAGNOSIS — Z79899 Other long term (current) drug therapy: Secondary | ICD-10-CM | POA: Diagnosis not present

## 2015-10-24 DIAGNOSIS — J45901 Unspecified asthma with (acute) exacerbation: Secondary | ICD-10-CM | POA: Diagnosis not present

## 2015-10-24 DIAGNOSIS — G8929 Other chronic pain: Secondary | ICD-10-CM | POA: Insufficient documentation

## 2015-10-24 DIAGNOSIS — I10 Essential (primary) hypertension: Secondary | ICD-10-CM | POA: Insufficient documentation

## 2015-10-24 DIAGNOSIS — Z8739 Personal history of other diseases of the musculoskeletal system and connective tissue: Secondary | ICD-10-CM | POA: Insufficient documentation

## 2015-10-24 MED ORDER — PREDNISONE 20 MG PO TABS
60.0000 mg | ORAL_TABLET | Freq: Once | ORAL | Status: AC
  Administered 2015-10-24: 60 mg via ORAL
  Filled 2015-10-24: qty 3

## 2015-10-24 MED ORDER — IPRATROPIUM-ALBUTEROL 0.5-2.5 (3) MG/3ML IN SOLN
3.0000 mL | Freq: Once | RESPIRATORY_TRACT | Status: AC
  Administered 2015-10-24: 3 mL via RESPIRATORY_TRACT
  Filled 2015-10-24: qty 3

## 2015-10-24 MED ORDER — IPRATROPIUM BROMIDE 0.02 % IN SOLN: 0.5000 mg | Freq: Four times a day (QID) | RESPIRATORY_TRACT | Status: DC

## 2015-10-24 MED ORDER — BENZONATATE 100 MG PO CAPS
100.0000 mg | ORAL_CAPSULE | Freq: Once | ORAL | Status: AC
  Administered 2015-10-24: 100 mg via ORAL
  Filled 2015-10-24: qty 1

## 2015-10-24 MED ORDER — ALBUTEROL SULFATE (2.5 MG/3ML) 0.083% IN NEBU
5.0000 mg | INHALATION_SOLUTION | Freq: Once | RESPIRATORY_TRACT | Status: AC
  Administered 2015-10-24: 5 mg via RESPIRATORY_TRACT
  Filled 2015-10-24: qty 6

## 2015-10-24 MED ORDER — IPRATROPIUM-ALBUTEROL 0.5-2.5 (3) MG/3ML IN SOLN
3.0000 mL | RESPIRATORY_TRACT | Status: AC
  Administered 2015-10-24 (×2): 3 mL via RESPIRATORY_TRACT
  Filled 2015-10-24 (×2): qty 3

## 2015-10-24 MED ORDER — PREDNISONE 20 MG PO TABS: ORAL_TABLET | ORAL | Status: DC

## 2015-10-24 NOTE — ED Notes (Signed)
Ambulated around Pod B on room air; sats 95% entire walk without decreasing.

## 2015-10-24 NOTE — ED Notes (Signed)
On Thursday working in yard and got poison ivy on bilat forearms, used calamine lotion on Thursday. Woke up Thursday night with wheezing, coughing, SOB. Using inhaler and breathing treatments at home daily without improvement. Continues to cough and feel like can't get a full breath. Pain only with coughing.

## 2015-10-24 NOTE — ED Notes (Signed)
Patient transported to X-ray 

## 2015-10-24 NOTE — Discharge Instructions (Signed)

## 2015-10-24 NOTE — ED Provider Notes (Signed)
CSN: [id number]     Arrival date & time 10/24/15  [id number] History   First MD Initiated Contact with Patient 10/24/15 0935     Chief Complaint  Patient presents with  . Allergic Reaction  . Cough     (Consider location/radiation/quality/duration/timing/severity/associated sxs/prior Treatment) HPI Patient has history of asthma. She reports is usually fairly mild. She reports for 3 days now she has however had increased coughing and shortness of breath. She got poison ivy on her arms from working in the garden. She states she placed calamine lotion on them and it seemed to make her asthma worse. She removed the calamine lotion and there was some improvement. She has however continued to cough and have shortness of breath since Thursday. No fever and no sputum with cough. Patient denies chest pain. She has been using her inhaler and breathing treatment at home daily without significant improvement. She reports she feels a she can get some air in but it is harder to get it back out. No lower extremity swelling or calf pain. Past Medical History  Diagnosis Date  . Tachycardia   . Hypertension   . Asthma   . SVT (supraventricular tachycardia) ([place])   . Headache   . Chronic back pain   . Rotator cuff syndrome of left shoulder    Past Surgical History  Procedure Laterality Date  . Abdominal hysterectomy    . Cholecystectomy    . Tubal ligation     Family History  Problem Relation Age of Onset  . Heart disease Mother   . Prostate cancer Father   . Colon polyps Father   . Heart disease Father   . Hyperlipidemia Father   . Hyperlipidemia Mother   . Heart attack Mother   . Heart attack Father    Social History  Substance Use Topics  . Smoking status: Never Smoker   . Smokeless tobacco: Never Used  . Alcohol Use: No   OB History    Gravida Para Term Preterm AB TAB SAB Ectopic Multiple Living   3 3 3       2      Review of Systems 10 Systems reviewed and are negative for acute change  except as noted in the HPI.    Allergies  Review of patient's allergies indicates no known allergies.  Home Medications   Prior to Admission medications   Medication Sig Start Date End Date Taking? Authorizing Provider  albuterol (PROVENTIL HFA;VENTOLIN HFA) 108 (90 Base) MCG/ACT inhaler Inhale 2 puffs into the lungs every 6 (six) hours as needed for wheezing or shortness of breath. 08/31/15  Yes Arnoldo Morale, MD  albuterol (PROVENTIL) (2.5 MG/3ML) 0.083% nebulizer solution Take 3 mLs (2.5 mg total) by nebulization every 6 (six) hours as needed for wheezing or shortness of breath. 08/31/15  Yes Arnoldo Morale, MD  amLODipine (NORVASC) 5 MG tablet Take 1 tablet (5 mg total) by mouth daily. 08/31/15  Yes Arnoldo Morale, MD  aspirin EC 81 MG tablet Take 1 tablet (81 mg total) by mouth daily. 08/23/15  Yes Pattricia Boss, MD  GuaiFENesin (TUSSIN PO) Take 10 mLs by mouth every 4 (four) hours as needed (cough).   Yes Historical Provider, MD  metoprolol succinate (TOPROL-XL) 25 MG 24 hr tablet Take 1 tablet (25 mg total) by mouth daily. 08/23/15  Yes Pattricia Boss, MD  pantoprazole (PROTONIX) 40 MG tablet Take 1 tablet (40 mg total) by mouth daily. 08/23/15  Yes Pattricia Boss, MD  ipratropium (ATROVENT) 0.02 %  nebulizer solution Take 2.5 mLs (0.5 mg total) by nebulization 4 (four) times daily. Use with your albuterol solution 10/24/15   Charlesetta Shanks, MD  predniSONE (DELTASONE) 20 MG tablet 3 tabs po day one, then 2 po daily x 4 days 10/24/15   Charlesetta Shanks, MD   BP 117/70 mmHg  Pulse 81  Temp(Src) 98.2 F (36.8 C) (Oral)  Resp 18  Ht 5' 4.5" (1.638 m)  Wt 149 lb (67.586 kg)  BMI 25.19 kg/m2  SpO2 93% Physical Exam  Constitutional: She is oriented to person, place, and time. She appears well-developed and well-nourished.  HENT:  Head: Normocephalic and atraumatic.  Right Ear: External ear normal.  Nose: Nose normal.  Mouth/Throat: Oropharynx is clear and moist.  Eyes: EOM are normal. Pupils are  equal, round, and reactive to light.  Neck: Neck supple.  Cardiovascular: Normal rate, regular rhythm, normal heart sounds and intact distal pulses.   Pulmonary/Chest: Effort normal. She has wheezes.  Decreased air flow to the bases. Expiratory wheeze at bases. Patient develops cough peroxisomal with deep inspiration.  Abdominal: Soft. Bowel sounds are normal. She exhibits no distension. There is no tenderness.  Musculoskeletal: Normal range of motion. She exhibits no edema or tenderness.  Neurological: She is alert and oriented to person, place, and time. She has normal strength. Coordination normal. GCS eye subscore is 4. GCS verbal subscore is 5. GCS motor subscore is 6.  Skin: Skin is warm, dry and intact.  Psychiatric: She has a normal mood and affect.    ED Course  Procedures (including critical care time) Labs Review Labs Reviewed - No data to display  Imaging Review Dg Chest 2 View  10/24/2015  CLINICAL DATA:  Cough, wheezing, and shortness of breath for 3 days. EXAM: CHEST  2 VIEW COMPARISON:  08/23/2015 FINDINGS: The heart size and mediastinal contours are within normal limits. Both lungs are clear. The visualized skeletal structures are unremarkable. IMPRESSION: No active cardiopulmonary disease. Electronically Signed   By: Earle Gell M.D.   On: 10/24/2015 10:51   I have personally reviewed and evaluated these images and lab results as part of my medical decision-making.   EKG Interpretation None     Recheck: 12:10 patient has finished second DuoNeb. She feels improved, patient still has some cough peroxisomal with deep inspiration. Imroved airflow to bases. MDM   Final diagnoses:  Asthma exacerbation   Patient has dry cough without associated symptoms of pneumonia. Chest x-ray does not show infiltrate. Patient has known history of asthma. She is otherwise well in appearance. Patient has had good response to 3 DuoNeb treatments and steroids. She continues to have some  wheezing but does not have respiratory distress and is able to ambulate appropriately without desaturation. Patient is counseled on taking 4 more days of prednisone and every 4 hours DuoNeb. She is counseled on the need to return if symptoms should worsen or change.    Charlesetta Shanks, MD 10/24/15 [phone]

## 2015-11-09 ENCOUNTER — Other Ambulatory Visit: Payer: Self-pay | Admitting: Physician Assistant

## 2015-11-09 ENCOUNTER — Encounter: Payer: Self-pay | Admitting: Physician Assistant

## 2015-11-09 ENCOUNTER — Ambulatory Visit: Payer: BLUE CROSS/BLUE SHIELD | Attending: Internal Medicine | Admitting: Physician Assistant

## 2015-11-09 VITALS — BP 128/76 | HR 78 | Temp 97.8°F | Wt 161.0 lb

## 2015-11-09 DIAGNOSIS — J209 Acute bronchitis, unspecified: Secondary | ICD-10-CM

## 2015-11-09 DIAGNOSIS — I10 Essential (primary) hypertension: Secondary | ICD-10-CM

## 2015-11-09 DIAGNOSIS — J4531 Mild persistent asthma with (acute) exacerbation: Secondary | ICD-10-CM

## 2015-11-09 MED ORDER — PREDNISONE 10 MG PO TABS: ORAL_TABLET | ORAL | Status: DC

## 2015-11-09 MED ORDER — AZITHROMYCIN 250 MG PO TABS: ORAL_TABLET | ORAL | Status: DC

## 2015-11-09 MED ORDER — MOMETASONE FURO-FORMOTEROL FUM 200-5 MCG/ACT IN AERO: 2.0000 | INHALATION_SPRAY | Freq: Two times a day (BID) | RESPIRATORY_TRACT | Status: DC

## 2015-11-09 MED ORDER — FLUTICASONE-SALMETEROL 250-50 MCG/DOSE IN AEPB: 1.0000 | INHALATION_SPRAY | Freq: Two times a day (BID) | RESPIRATORY_TRACT | Status: DC

## 2015-11-09 NOTE — Progress Notes (Signed)
Patient ID: Emily Clarke, female   DOB: 26-Jan-1951, 65 y.o.   MRN: [id number]   Emily Clarke, is a 65 y.o. female  [id number]  [id number]  DOB - August 16, 1950  Chief Complaint  Patient presents with  . URI    Cough-yellow;wheezing;;SOB; chest congestion x 1 week        Subjective:  Chief Complaint and HPI: Emily Clarke is a 65 y.o. female here today for reactive airways.  She was seen in the ED on 10/24/2015 and given prednisone and breathing treatments and was improving until last night when she started wheezing and coughing a lot more again.  She denies f/c.  Mucus is greenish yellow at times and also clear at times.  Her symptoms had mostly improved until last night.  She only took steroids for about 4 or 5 days.  She is doing breathing treatments at home which help some.  Her initial exacerbation started after a poison ivy/oak exposure.  No N/V.   ED notes/CXR reviewed.    ROS:   Constitutional:  No f/c, No night sweats, No unexplained weight loss. EENT:  No vision changes, No blurry vision, No hearing changes. No mouth, throat, or ear problems.  Respiratory: No cough, No SOB Cardiac: No CP, no palpitations GI:  No abd pain, No N/V/D. GU: No Urinary s/sx Musculoskeletal: No joint pain Neuro: No headache, no dizziness, no motor weakness.  Skin: No rash Endocrine:  No polydipsia. No polyuria.  Psych: Denies SI/HI  No problems updated.  ALLERGIES: No Known Allergies  PAST MEDICAL HISTORY: Past Medical History  Diagnosis Date  . Tachycardia   . Hypertension   . Asthma   . SVT (supraventricular tachycardia) ([place])   . Headache   . Chronic back pain   . Rotator cuff syndrome of left shoulder     MEDICATIONS AT HOME: Prior to Admission medications   Medication Sig Start Date End Date Taking? Authorizing Provider  albuterol (PROVENTIL HFA;VENTOLIN HFA) 108 (90 Base) MCG/ACT inhaler Inhale 2 puffs into the lungs every 6 (six) hours as needed for wheezing or  shortness of breath. 08/31/15  Yes Arnoldo Morale, MD  albuterol (PROVENTIL) (2.5 MG/3ML) 0.083% nebulizer solution Take 3 mLs (2.5 mg total) by nebulization every 6 (six) hours as needed for wheezing or shortness of breath. 08/31/15  Yes Arnoldo Morale, MD  amLODipine (NORVASC) 5 MG tablet Take 1 tablet (5 mg total) by mouth daily. 08/31/15  Yes Arnoldo Morale, MD  aspirin EC 81 MG tablet Take 1 tablet (81 mg total) by mouth daily. 08/23/15  Yes Pattricia Boss, MD  GuaiFENesin (TUSSIN PO) Take 10 mLs by mouth every 4 (four) hours as needed (cough).   Yes Historical Provider, MD  ipratropium (ATROVENT) 0.02 % nebulizer solution Take 2.5 mLs (0.5 mg total) by nebulization 4 (four) times daily. Use with your albuterol solution 10/24/15  Yes Charlesetta Shanks, MD  metoprolol succinate (TOPROL-XL) 25 MG 24 hr tablet Take 1 tablet (25 mg total) by mouth daily. 08/23/15  Yes Pattricia Boss, MD  pantoprazole (PROTONIX) 40 MG tablet Take 1 tablet (40 mg total) by mouth daily. 08/23/15  Yes Pattricia Boss, MD  azithromycin (ZITHROMAX) 250 MG tablet 2 tabs today, 1 tab daily thereafter 11/09/15   Argentina Donovan, PA-C  Fluticasone-Salmeterol (ADVAIR) 250-50 MCG/DOSE AEPB Inhale 1 puff into the lungs 2 (two) times daily. 11/09/15   Argentina Donovan, PA-C  predniSONE (DELTASONE) 10 MG tablet 6,5,4,3,2,1 take each days dose all at once in am  with food 11/09/15   Argentina Donovan, PA-C     Objective:  EXAM:   Filed Vitals:   11/09/15 1203  BP: 128/76  Pulse: 78  Temp: 97.8 F (36.6 C)  TempSrc: Oral  Weight: 161 lb (73.029 kg)  SpO2: 97%    General appearance : A&OX3. NAD. Non-toxic-appearing HEENT: Atraumatic and Normocephalic.  PERRLA. EOM intact.  TM clear B. Mouth-MMM, post pharynx WNL w/o erythema, No PND. Neck: supple, no JVD. No cervical lymphadenopathy. No thyromegaly Chest/Lungs:  Breathing-non-labored, NAD. Good air entry bilaterally, breath sounds without rales or rhonchi.  There is mild diffuse wheezing  throughout.  CVS: S1 S2 regular, no murmurs, gallops, rubs  Extremities: Bilateral Lower Ext shows no edema, both legs are warm to touch with = pulse throughout Neurology:  CN II-XII grossly intact, Non focal.   Psych:  TP linear. J/I WNL. Normal speech. Appropriate eye contact and affect.  Skin:  No Rash  Data Review Lab Results  Component Value Date   HGBA1C 5.8 08/31/2015   HGBA1C 5.90 12/07/2014   HGBA1C 5.8 06/29/2014     Assessment & Plan   1. Reactive airway disease, mild persistent, with acute exacerbation - predniSONE (DELTASONE) 10 MG tablet; 6,5,4,3,2,1 take each days dose all at once in am with food  Dispense: 21 tablet; Refill: 0 - Fluticasone-Salmeterol (ADVAIR) 250-50 MCG/DOSE AEPB; Inhale 1 puff into the lungs 2 (two) times daily.  Dispense: 60 each; Refill: 0  2. Essential hypertension Controlled.  Continue current regimen.   3. Acute bronchitis, unspecified organism Will cover for atypicals  - azithromycin (ZITHROMAX) 250 MG tablet; 2 tabs today, 1 tab daily thereafter  Dispense: 6 each; Refill: 0  Patient have been counseled extensively about nutrition and exercise  Return for keep current f/up appt..which is scheduled in a few weeks.  Recheck in 48hrs if not improving.  To ER if worsens.  OOWX 2 days.   The patient was given clear instructions to go to ER or return to medical center if symptoms don't improve, worsen or new problems develop. The patient verbalized understanding. The patient was told to call to get lab results if they haven't heard anything in the next week.     Freeman Caldron, PA-C [facility] [address] [place], [place]   11/09/2015, 1:37 PM

## 2015-12-23 ENCOUNTER — Other Ambulatory Visit: Payer: Self-pay

## 2015-12-23 ENCOUNTER — Emergency Department (HOSPITAL_COMMUNITY): Payer: BLUE CROSS/BLUE SHIELD

## 2015-12-23 ENCOUNTER — Encounter (HOSPITAL_COMMUNITY): Payer: Self-pay | Admitting: Emergency Medicine

## 2015-12-23 ENCOUNTER — Emergency Department (HOSPITAL_COMMUNITY)
Admission: EM | Admit: 2015-12-23 | Discharge: 2015-12-23 | Disposition: A | Discharge status: Home / Self Care | Payer: BLUE CROSS/BLUE SHIELD | Attending: Emergency Medicine | Admitting: Emergency Medicine

## 2015-12-23 DIAGNOSIS — Z7982 Long term (current) use of aspirin: Secondary | ICD-10-CM | POA: Diagnosis not present

## 2015-12-23 DIAGNOSIS — R079 Chest pain, unspecified: Secondary | ICD-10-CM | POA: Diagnosis present

## 2015-12-23 DIAGNOSIS — Z79899 Other long term (current) drug therapy: Secondary | ICD-10-CM | POA: Diagnosis not present

## 2015-12-23 DIAGNOSIS — M549 Dorsalgia, unspecified: Secondary | ICD-10-CM

## 2015-12-23 DIAGNOSIS — M546 Pain in thoracic spine: Secondary | ICD-10-CM | POA: Diagnosis not present

## 2015-12-23 DIAGNOSIS — I10 Essential (primary) hypertension: Secondary | ICD-10-CM | POA: Insufficient documentation

## 2015-12-23 DIAGNOSIS — J45909 Unspecified asthma, uncomplicated: Secondary | ICD-10-CM | POA: Diagnosis not present

## 2015-12-23 LAB — URINE MICROSCOPIC-ADD ON: RBC / HPF: NONE SEEN RBC/hpf (ref 0–5)

## 2015-12-23 LAB — URINALYSIS, ROUTINE W REFLEX MICROSCOPIC
Bilirubin Urine: NEGATIVE
Glucose, UA: NEGATIVE mg/dL
Ketones, ur: NEGATIVE mg/dL
Leukocytes, UA: NEGATIVE
Nitrite: NEGATIVE
Protein, ur: NEGATIVE mg/dL
Specific Gravity, Urine: 1.016 (ref 1.005–1.030)
pH: 6 (ref 5.0–8.0)

## 2015-12-23 LAB — CBC
HCT: 37.5 % (ref 36.0–46.0)
Hemoglobin: 12.1 g/dL (ref 12.0–15.0)
MCH: 27.4 pg (ref 26.0–34.0)
MCHC: 32.3 g/dL (ref 30.0–36.0)
MCV: 84.8 fL (ref 78.0–100.0)
Platelets: 265 10*3/uL (ref 150–400)
RBC: 4.42 MIL/uL (ref 3.87–5.11)
RDW: 13.5 % (ref 11.5–15.5)
WBC: 8.4 10*3/uL (ref 4.0–10.5)

## 2015-12-23 LAB — COMPREHENSIVE METABOLIC PANEL
ALT: 16 U/L (ref 14–54)
AST: 18 U/L (ref 15–41)
Albumin: 3.4 g/dL — ABNORMAL LOW (ref 3.5–5.0)
Alkaline Phosphatase: 127 U/L — ABNORMAL HIGH (ref 38–126)
Anion gap: 8 (ref 5–15)
BUN: 10 mg/dL (ref 6–20)
CO2: 28 mmol/L (ref 22–32)
Calcium: 9.6 mg/dL (ref 8.9–10.3)
Chloride: 106 mmol/L (ref 101–111)
Creatinine, Ser: 0.67 mg/dL (ref 0.44–1.00)
GFR calc Af Amer: >60 mL/min (ref >60)
GFR calc non Af Amer: >60 mL/min (ref >60)
Glucose, Bld: 105 mg/dL — ABNORMAL HIGH (ref 65–99)
Potassium: 3.4 mmol/L — ABNORMAL LOW (ref 3.5–5.1)
Sodium: 142 mmol/L (ref 135–145)
Total Bilirubin: 0.6 mg/dL (ref 0.3–1.2)
Total Protein: 7.1 g/dL (ref 6.5–8.1)

## 2015-12-23 LAB — I-STAT TROPONIN, ED
Troponin i, poc: 0 ng/mL (ref 0.00–0.08)
Troponin i, poc: 0 ng/mL (ref 0.00–0.08)

## 2015-12-23 MED ORDER — TRAMADOL HCL 50 MG PO TABS: 50.0000 mg | ORAL_TABLET | Freq: Four times a day (QID) | ORAL | Status: DC | PRN

## 2015-12-23 NOTE — ED Notes (Addendum)
Chest pressure left shoulder pain and neck pain since last Monday states that she has had swelling in her feet x 1 month,it comes and goes no sob has had a cough, light green phlegm

## 2015-12-23 NOTE — ED Provider Notes (Signed)
CSN: [id number]     Arrival date & time 12/23/15  0702 History   First MD Initiated Contact with Patient 12/23/15 0809     Chief Complaint  Patient presents with  . Chest Pain     (Consider location/radiation/quality/duration/timing/severity/associated sxs/prior Treatment) Patient is a 65 y.o. female presenting with chest pain. The history is provided by the patient (Patient complains of left upper back pain some chest pain.).  Chest Pain Pain location:  L chest Pain quality: aching   Pain radiates to:  Upper back Pain radiates to the back: no   Pain severity:  Mild Onset quality:  Sudden Timing:  Rare Associated symptoms: back pain   Associated symptoms: no abdominal pain, no cough, no fatigue and no headache     Past Medical History  Diagnosis Date  . Tachycardia   . Hypertension   . Asthma   . SVT (supraventricular tachycardia) ([place])   . Headache   . Chronic back pain   . Rotator cuff syndrome of left shoulder    Past Surgical History  Procedure Laterality Date  . Abdominal hysterectomy    . Cholecystectomy    . Tubal ligation     Family History  Problem Relation Age of Onset  . Heart disease Mother   . Prostate cancer Father   . Colon polyps Father   . Heart disease Father   . Hyperlipidemia Father   . Hyperlipidemia Mother   . Heart attack Mother   . Heart attack Father    Social History  Substance Use Topics  . Smoking status: Never Smoker   . Smokeless tobacco: Never Used  . Alcohol Use: No   OB History    Gravida Para Term Preterm AB TAB SAB Ectopic Multiple Living   3 3 3       2      Review of Systems  Constitutional: Negative for appetite change and fatigue.  HENT: Negative for congestion, ear discharge and sinus pressure.   Eyes: Negative for discharge.  Respiratory: Negative for cough.   Cardiovascular: Positive for chest pain.  Gastrointestinal: Negative for abdominal pain and diarrhea.  Genitourinary: Negative for frequency and  hematuria.  Musculoskeletal: Positive for back pain.  Skin: Negative for rash.  Neurological: Negative for seizures and headaches.  Psychiatric/Behavioral: Negative for hallucinations.      Allergies  Review of patient's allergies indicates no known allergies.  Home Medications   Prior to Admission medications   Medication Sig Start Date End Date Taking? Authorizing Provider  albuterol (PROVENTIL HFA;VENTOLIN HFA) 108 (90 Base) MCG/ACT inhaler Inhale 2 puffs into the lungs every 6 (six) hours as needed for wheezing or shortness of breath. 08/31/15  Yes Arnoldo Morale, MD  albuterol (PROVENTIL) (2.5 MG/3ML) 0.083% nebulizer solution Take 3 mLs (2.5 mg total) by nebulization every 6 (six) hours as needed for wheezing or shortness of breath. 08/31/15  Yes Arnoldo Morale, MD  amLODipine (NORVASC) 5 MG tablet Take 1 tablet (5 mg total) by mouth daily. 08/31/15  Yes Arnoldo Morale, MD  aspirin EC 81 MG tablet Take 1 tablet (81 mg total) by mouth daily. 08/23/15  Yes Pattricia Boss, MD  levalbuterol [facility] HFA) 45 MCG/ACT inhaler Inhale 2 puffs into the lungs every 4 (four) hours as needed for wheezing.   Yes Historical Provider, MD  metoprolol succinate (TOPROL-XL) 25 MG 24 hr tablet Take 1 tablet (25 mg total) by mouth daily. 08/23/15  Yes Pattricia Boss, MD  mometasone-formoterol (DULERA) 200-5 MCG/ACT AERO Inhale 2  puffs into the lungs 2 (two) times daily. 11/09/15  Yes Dionne Bucy McClung, PA-C  pantoprazole (PROTONIX) 40 MG tablet Take 1 tablet (40 mg total) by mouth daily. 08/23/15  Yes Pattricia Boss, MD  azithromycin (ZITHROMAX) 250 MG tablet 2 tabs today, 1 tab daily thereafter 11/09/15   Argentina Donovan, PA-C  GuaiFENesin (TUSSIN PO) Take 10 mLs by mouth every 4 (four) hours as needed (cough).    Historical Provider, MD  ipratropium (ATROVENT) 0.02 % nebulizer solution Take 2.5 mLs (0.5 mg total) by nebulization 4 (four) times daily. Use with your albuterol solution 10/24/15   Charlesetta Shanks, MD  predniSONE  (DELTASONE) 10 MG tablet 6,5,4,3,2,1 take each days dose all at once in am with food 11/09/15   Argentina Donovan, PA-C  traMADol (ULTRAM) 50 MG tablet Take 1 tablet (50 mg total) by mouth every 6 (six) hours as needed. 12/23/15   Milton Ferguson, MD   BP 125/86 mmHg  Pulse 69  Temp(Src) 98.4 F (36.9 C) (Oral)  Resp 17  Ht 5\' 4"  (1.626 m)  SpO2 95% Physical Exam  Constitutional: She is oriented to person, place, and time. She appears well-developed.  HENT:  Head: Normocephalic.  Eyes: Conjunctivae and EOM are normal. No scleral icterus.  Neck: Neck supple. No thyromegaly present.  Cardiovascular: Normal rate and regular rhythm.  Exam reveals no gallop and no friction rub.   No murmur heard. Pulmonary/Chest: No stridor. She has no wheezes. She has no rales. She exhibits no tenderness.  Abdominal: She exhibits no distension. There is no tenderness. There is no rebound.  Musculoskeletal: Normal range of motion. She exhibits no edema.  Tender left upper back  Lymphadenopathy:    She has no cervical adenopathy.  Neurological: She is oriented to person, place, and time. She exhibits normal muscle tone. Coordination normal.  Skin: No rash noted. No erythema.  Psychiatric: She has a normal mood and affect. Her behavior is normal.    ED Course  Procedures (including critical care time) Labs Review Labs Reviewed  COMPREHENSIVE METABOLIC PANEL - Abnormal; Notable for the following:    Potassium 3.4 (*)    Glucose, Bld 105 (*)    Albumin 3.4 (*)    Alkaline Phosphatase 127 (*)    All other components within normal limits  URINALYSIS, ROUTINE W REFLEX MICROSCOPIC (NOT AT [facility]) - Abnormal; Notable for the following:    APPearance TURBID (*)    Hgb urine dipstick TRACE (*)    All other components within normal limits  URINE MICROSCOPIC-ADD ON - Abnormal; Notable for the following:    Squamous Epithelial / LPF TOO NUMEROUS TO COUNT (*)    Bacteria, UA MANY (*)    All other components within  normal limits  CBC  I-STAT TROPOININ, ED  I-STAT TROPOININ, ED  Randolm Idol, ED    Imaging Review Dg Chest 2 View  12/23/2015  CLINICAL DATA:  Chest pain EXAM: CHEST  2 VIEW COMPARISON:  10/24/2015 FINDINGS: The heart size and mediastinal contours are within normal limits. Both lungs are clear. The visualized skeletal structures are unremarkable. IMPRESSION: No active cardiopulmonary disease. Electronically Signed   By: Franchot Gallo M.D.   On: 12/23/2015 07:44   I have personally reviewed and evaluated these images and lab results as part of my medical decision-making.   EKG Interpretation   Date/Time:  Thursday December 23 2015 07:16:33 EDT Ventricular Rate:  69 PR Interval:  194 QRS Duration: 88 QT Interval:  404  QTC Calculation: 432 R Axis:   60 Text Interpretation:  Normal sinus rhythm Normal ECG Confirmed by Wing Schoch   MD, Lisa-Marie Rueger [phone]) on 12/23/2015 8:16:15 AM Also confirmed by Kahmari Koller  MD,  Broadus John [phone])  on 12/23/2015 12:13:06 PM      MDM   Final diagnoses:  Upper back pain on left side    EKG unremarkable. Chest x-ray unremarkable. Troponin 2 normal. Suspect pain is not heart related. Suspect discomfort is more musculoskeletal. Patient will be given Ultram will follow-up with her PCP    Milton Ferguson, MD 12/23/15 1227

## 2015-12-23 NOTE — Discharge Instructions (Signed)
Follow up with your md next week. °

## 2016-01-19 ENCOUNTER — Encounter: Payer: Self-pay | Admitting: Internal Medicine

## 2016-02-01 ENCOUNTER — Encounter: Payer: Self-pay | Admitting: Internal Medicine

## 2016-03-09 ENCOUNTER — Other Ambulatory Visit: Payer: Self-pay | Admitting: Physician Assistant

## 2016-03-09 ENCOUNTER — Other Ambulatory Visit: Payer: Self-pay | Admitting: *Deleted

## 2016-03-09 ENCOUNTER — Other Ambulatory Visit: Payer: Self-pay

## 2016-03-09 DIAGNOSIS — Z1231 Encounter for screening mammogram for malignant neoplasm of breast: Secondary | ICD-10-CM

## 2016-03-23 ENCOUNTER — Ambulatory Visit (AMBULATORY_SURGERY_CENTER): Payer: Self-pay | Admitting: *Deleted

## 2016-03-23 VITALS — Ht 64.5 in | Wt 159.0 lb

## 2016-03-23 DIAGNOSIS — Z86010 Personal history of colonic polyps: Secondary | ICD-10-CM

## 2016-03-23 MED ORDER — NA SULFATE-K SULFATE-MG SULF 17.5-3.13-1.6 GM/177ML PO SOLN: 1.0000 | Freq: Once | ORAL | 0 refills | Status: AC

## 2016-03-23 NOTE — Progress Notes (Signed)
No egg or soy allergy known to patient  No issues with past sedation with any surgeries  or procedures, no intubation problems  No diet pills per patient No home 02 use per patient  No blood thinners per patient  Pt denies issues with constipation  No A fib or A flutter   emmi declined'   

## 2016-03-24 ENCOUNTER — Encounter: Payer: Self-pay | Admitting: Internal Medicine

## 2016-04-06 ENCOUNTER — Ambulatory Visit (AMBULATORY_SURGERY_CENTER): Payer: BLUE CROSS/BLUE SHIELD | Admitting: Internal Medicine

## 2016-04-06 ENCOUNTER — Encounter: Payer: Self-pay | Admitting: Internal Medicine

## 2016-04-06 VITALS — BP 113/60 | HR 54 | Temp 95.3°F | Resp 12 | Ht 64.0 in | Wt 159.0 lb

## 2016-04-06 DIAGNOSIS — Z86010 Personal history of colonic polyps: Secondary | ICD-10-CM

## 2016-04-06 DIAGNOSIS — D123 Benign neoplasm of transverse colon: Secondary | ICD-10-CM

## 2016-04-06 MED ORDER — SODIUM CHLORIDE 0.9 % IV SOLN: 500.0000 mL | INTRAVENOUS | Status: AC

## 2016-04-06 NOTE — Progress Notes (Signed)
Called to room to assist during endoscopic procedure.  Patient ID and intended procedure confirmed with present staff. Received instructions for my participation in the procedure from the performing physician.  

## 2016-04-06 NOTE — Progress Notes (Signed)
A and O x3. Report to RN. Tolerated MAC anesthesia well. 

## 2016-04-06 NOTE — Op Note (Signed)
[place] Patient Name: Emily Clarke Procedure Date: 04/06/2016 8:01 AM MRN: [id number] Endoscopist: Jerene Bears , MD Age: 65 Referring MD:  Date of Birth: [birth year] Gender: Female Account #: [id number] Procedure:                Colonoscopy Indications:              Surveillance: Personal history of adenomatous                            polyps on last colonoscopy 5 years ago Medicines:                Monitored Anesthesia Care Procedure:                Pre-Anesthesia Assessment:                           - Prior to the procedure, a History and Physical                            was performed, and patient medications and                            allergies were reviewed. The patient's tolerance of                            previous anesthesia was also reviewed. The risks                            and benefits of the procedure and the sedation                            options and risks were discussed with the patient.                            All questions were answered, and informed consent                            was obtained. Prior Anticoagulants: The patient has                            taken no previous anticoagulant or antiplatelet                            agents. ASA Grade Assessment: II - A patient with                            mild systemic disease. After reviewing the risks                            and benefits, the patient was deemed in                            satisfactory condition to undergo the procedure.  After obtaining informed consent, the colonoscope                            was passed under direct vision. Throughout the                            procedure, the patient's blood pressure, pulse, and                            oxygen saturations were monitored continuously. The                            Model PCF-H190DL [id number]) scope was introduced                            through the anus and  advanced to the the cecum,                            identified by appendiceal orifice and ileocecal                            valve. The quality of the bowel preparation was                            excellent. The ileocecal valve, appendiceal                            orifice, and rectum were photographed. Scope In: 8:12:15 AM Scope Out: 8:24:34 AM Scope Withdrawal Time: 0 hours 9 minutes 53 seconds  Total Procedure Duration: 0 hours 12 minutes 19 seconds  Findings:                 The digital rectal exam was normal.                           A 4 mm polyp was found in the transverse colon. The                            polyp was sessile. The polyp was removed with a                            cold snare. Resection and retrieval were complete.                           External hemorrhoids were found during                            retroflexion. The hemorrhoids were small.                           The exam was otherwise without abnormality. Complications:            No immediate complications. Estimated Blood Loss:     Estimated blood loss: none. Impression:               -  One 4 mm polyp in the transverse colon, removed                            with a cold snare. Resected and retrieved.                           - External hemorrhoids.                           - The examination was otherwise normal. Recommendation:           - Patient has a contact number available for                            emergencies. The signs and symptoms of potential                            delayed complications were discussed with the                            patient. Return to normal activities tomorrow.                            Written discharge instructions were provided to the                            patient.                           - Resume previous diet.                           - Continue present medications.                           - Await pathology results.                            - Repeat colonoscopy in 5 years for surveillance. Jerene Bears, MD 04/06/2016 8:27:15 AM This report has been signed electronically.

## 2016-04-06 NOTE — Patient Instructions (Signed)
Impression/recommendations:  Polyp (handout given) Hemorrhoids (handout given)  Repeat colonoscopy in 5 years.  YOU HAD AN ENDOSCOPIC PROCEDURE TODAY AT [facility]:   Refer to the procedure report that was given to you for any specific questions about what was found during the examination.  If the procedure report does not answer your questions, please call your gastroenterologist to clarify.  If you requested that your care partner not be given the details of your procedure findings, then the procedure report has been included in a sealed envelope for you to review at your convenience later.  YOU SHOULD EXPECT: Some feelings of bloating in the abdomen. Passage of more gas than usual.  Walking can help get rid of the air that was put into your GI tract during the procedure and reduce the bloating. If you had a lower endoscopy (such as a colonoscopy or flexible sigmoidoscopy) you may notice spotting of blood in your stool or on the toilet paper. If you underwent a bowel prep for your procedure, you may not have a normal bowel movement for a few days.  Please Note:  You might notice some irritation and congestion in your nose or some drainage.  This is from the oxygen used during your procedure.  There is no need for concern and it should clear up in a day or so.  SYMPTOMS TO REPORT IMMEDIATELY:   Following lower endoscopy (colonoscopy or flexible sigmoidoscopy):  Excessive amounts of blood in the stool  Significant tenderness or worsening of abdominal pains  Swelling of the abdomen that is new, acute  Fever of 100F or higher   For urgent or emergent issues, a gastroenterologist can be reached at any hour by calling [phone].   DIET:  We do recommend a small meal at first, but then you may proceed to your regular diet.  Drink plenty of fluids but you should avoid alcoholic beverages for 24 hours.  ACTIVITY:  You should plan to take it easy for the rest of today and  you should NOT DRIVE or use heavy machinery until tomorrow (because of the sedation medicines used during the test).    FOLLOW UP: Our staff will call the number listed on your records the next business day following your procedure to check on you and address any questions or concerns that you may have regarding the information given to you following your procedure. If we do not reach you, we will leave a message.  However, if you are feeling well and you are not experiencing any problems, there is no need to return our call.  We will assume that you have returned to your regular daily activities without incident.  If any biopsies were taken you will be contacted by phone or by letter within the next 1-3 weeks.  Please call us at [phone] if you have not heard about the biopsies in 3 weeks.    SIGNATURES/CONFIDENTIALITY: You and/or your care partner have signed paperwork which will be entered into your electronic medical record.  These signatures attest to the fact that that the information above on your After Visit Summary has been reviewed and is understood.  Full responsibility of the confidentiality of this discharge information lies with you and/or your care-partner.

## 2016-04-07 ENCOUNTER — Telehealth: Payer: Self-pay | Admitting: *Deleted

## 2016-04-07 ENCOUNTER — Telehealth: Payer: Self-pay

## 2016-04-07 NOTE — Telephone Encounter (Signed)
No answer, message left for the patient. 

## 2016-04-07 NOTE — Telephone Encounter (Signed)
  Follow up Call-  Call back number 04/06/2016  Post procedure Call Back phone  # [phone]  Permission to leave phone message Yes  Some recent data might be hidden    Patient was called for follow up after her procedure on 04/06/2016. No answer at the number given for follow up phone call. A message was left on the answering machine. This was the second attempt to contact the patient.

## 2016-04-07 NOTE — Telephone Encounter (Signed)
  Follow up Call-  Call back number 04/06/2016  Post procedure Call Back phone  # [phone]  Permission to leave phone message Yes  Some recent data might be hidden     Patient questions:  Do you have a fever, pain , or abdominal swelling? No. Pain Score  0 *  Have you tolerated food without any problems? Yes.    Have you been able to return to your normal activities? Yes.    Do you have any questions about your discharge instructions: Diet   No. Medications  No. Follow up visit  No.  Do you have questions or concerns about your Care? No.  Actions: * If pain score is 4 or above: No action needed, pain <4.

## 2016-04-11 ENCOUNTER — Ambulatory Visit
Admission: RE | Admit: 2016-04-11 | Discharge: 2016-04-11 | Disposition: A | Discharge status: Home / Self Care | Payer: BLUE CROSS/BLUE SHIELD | Source: Ambulatory Visit | Attending: Physician Assistant | Admitting: Physician Assistant

## 2016-04-11 DIAGNOSIS — Z1231 Encounter for screening mammogram for malignant neoplasm of breast: Secondary | ICD-10-CM

## 2016-04-14 ENCOUNTER — Encounter: Payer: Self-pay | Admitting: Internal Medicine

## 2017-01-23 ENCOUNTER — Emergency Department (HOSPITAL_COMMUNITY): Payer: BLUE CROSS/BLUE SHIELD

## 2017-01-23 ENCOUNTER — Encounter (HOSPITAL_COMMUNITY): Payer: Self-pay

## 2017-01-23 ENCOUNTER — Emergency Department (HOSPITAL_COMMUNITY)
Admission: EM | Admit: 2017-01-23 | Discharge: 2017-01-24 | Disposition: A | Discharge status: Home / Self Care | Payer: BLUE CROSS/BLUE SHIELD | Attending: Emergency Medicine | Admitting: Emergency Medicine

## 2017-01-23 ENCOUNTER — Other Ambulatory Visit: Payer: Self-pay

## 2017-01-23 DIAGNOSIS — R079 Chest pain, unspecified: Secondary | ICD-10-CM | POA: Insufficient documentation

## 2017-01-23 DIAGNOSIS — J45909 Unspecified asthma, uncomplicated: Secondary | ICD-10-CM | POA: Insufficient documentation

## 2017-01-23 DIAGNOSIS — I10 Essential (primary) hypertension: Secondary | ICD-10-CM | POA: Diagnosis not present

## 2017-01-23 DIAGNOSIS — Z7982 Long term (current) use of aspirin: Secondary | ICD-10-CM | POA: Insufficient documentation

## 2017-01-23 LAB — CBC
HCT: 36.7 % (ref 36.0–46.0)
Hemoglobin: 12.3 g/dL (ref 12.0–15.0)
MCH: 29 pg (ref 26.0–34.0)
MCHC: 33.5 g/dL (ref 30.0–36.0)
MCV: 86.6 fL (ref 78.0–100.0)
Platelets: 248 10*3/uL (ref 150–400)
RBC: 4.24 MIL/uL (ref 3.87–5.11)
RDW: 14 % (ref 11.5–15.5)
WBC: 10 10*3/uL (ref 4.0–10.5)

## 2017-01-23 LAB — BASIC METABOLIC PANEL
Anion gap: 8 (ref 5–15)
BUN: 11 mg/dL (ref 6–20)
CO2: 25 mmol/L (ref 22–32)
Calcium: 9.2 mg/dL (ref 8.9–10.3)
Chloride: 107 mmol/L (ref 101–111)
Creatinine, Ser: 0.61 mg/dL (ref 0.44–1.00)
GFR calc Af Amer: >60 mL/min (ref >60)
GFR calc non Af Amer: >60 mL/min (ref >60)
Glucose, Bld: 117 mg/dL — ABNORMAL HIGH (ref 65–99)
Potassium: 3.2 mmol/L — ABNORMAL LOW (ref 3.5–5.1)
Sodium: 140 mmol/L (ref 135–145)

## 2017-01-23 LAB — POCT I-STAT TROPONIN I: Troponin i, poc: 0 ng/mL (ref 0.00–0.08)

## 2017-01-23 MED ORDER — POTASSIUM CHLORIDE CRYS ER 20 MEQ PO TBCR
40.0000 meq | EXTENDED_RELEASE_TABLET | Freq: Once | ORAL | Status: AC
  Administered 2017-01-23: 40 meq via ORAL
  Filled 2017-01-23: qty 2

## 2017-01-23 NOTE — ED Provider Notes (Signed)
[facility] DEPT Provider Note   CSN: [id number] Arrival date & time: 01/23/17  1956     History   Chief Complaint Chief Complaint  Patient presents with  . Chest Pain    HPI Emily Clarke is a 66 y.o. female.  HPI  66 y.o. female with a hx of GERD, Asthma, HTN, presents to the Emergency Department today due to chest tightness all today. Notes similar occurrences on Saturday as well as Sunday. Occurred again this morning at rest. Notes pressure in chest most of the day. Pt went walking this morning which seemed to relieve chest pressure. Denies chest pain currently. No N/V. No diaphoresis. Noted headache this morning and checked her BP and said it was "high." Took BP meds and went for walk with improvement of BP. No hx ACS. No FH. Risk Factors include: HTN. No hx DVT/PE. No other symptoms noted.   Past Medical History:  Diagnosis Date  . Allergy    bronchitis  . Asthma   . Chronic back pain   . GERD (gastroesophageal reflux disease)    off meds currently  . Headache   . Hypertension   . Rotator cuff syndrome of left shoulder    pt denies  . Scoliosis   . SVT (supraventricular tachycardia) ([place])   . Tachycardia     Patient Active Problem List   Diagnosis Date Noted  . Cutaneous skin tags 09/23/2015  . Screening for colon cancer 07/01/2015  . Plantar callus 12/07/2014  . Hypokalemia 12/07/2014  . Cough 04/06/2014  . Essential hypertension, benign 04/06/2014  . Essential hypertension 11/18/2013  . SVT (supraventricular tachycardia) ([place]) 08/12/2013  . Chest pain 08/11/2013  . Levoscoliosis 04/07/2013  . Hepatic steatosis 01/16/2013  . Diverticulosis of jejunum without diverticulitis 01/16/2013  . Abnormal CT of the abdomen 01/16/2013  . Abdominal pain, other specified site 01/16/2013  . Low HDL (under 40) 10/13/2012  . Prediabetes 10/13/2012  . Chronic cough 10/11/2012  . Reactive airway disease 10/11/2012  . History of asthma 10/11/2012  . Ingrown toenail  10/11/2012  . Allergic rhinitis 10/11/2012  . Pleuritic chest pain 10/11/2012  . Tachycardia     Past Surgical History:  Procedure Laterality Date  . ABDOMINAL HYSTERECTOMY    . CHOLECYSTECTOMY    . COLONOSCOPY    . POLYPECTOMY    . TUBAL LIGATION      OB History    Gravida Para Term Preterm AB Living   3 3 3     2    SAB TAB Ectopic Multiple Live Births                   Home Medications    Prior to Admission medications   Medication Sig Start Date End Date Taking? Authorizing Provider  albuterol (PROVENTIL HFA;VENTOLIN HFA) 108 (90 Base) MCG/ACT inhaler Inhale 2 puffs into the lungs every 6 (six) hours as needed for wheezing or shortness of breath. 08/31/15  Yes Arnoldo Morale, MD  aspirin EC 81 MG tablet Take 1 tablet (81 mg total) by mouth daily. 08/23/15  Yes Pattricia Boss, MD  losartan (COZAAR) 100 MG tablet Take 1 tablet by mouth daily. 01/02/17  Yes [provider]  metoprolol succinate (TOPROL-XL) 25 MG 24 hr tablet Take 1 tablet (25 mg total) by mouth daily. 08/23/15  Yes Pattricia Boss, MD  albuterol (PROVENTIL) (2.5 MG/3ML) 0.083% nebulizer solution Take 3 mLs (2.5 mg total) by nebulization every 6 (six) hours as needed for wheezing or shortness of  breath. Patient not taking: Reported on 04/06/2016 08/31/15   Arnoldo Morale, MD  ipratropium (ATROVENT) 0.02 % nebulizer solution Take 2.5 mLs (0.5 mg total) by nebulization 4 (four) times daily. Use with your albuterol solution Patient not taking: Reported on 04/06/2016 10/24/15   Charlesetta Shanks, MD    Family History Family History  Problem Relation Age of Onset  . Heart disease Mother   . Hyperlipidemia Mother   . Heart attack Mother   . Prostate cancer Father   . Colon polyps Father   . Heart disease Father   . Hyperlipidemia Father   . Heart attack Father   . Colon cancer Neg Hx   . Esophageal cancer Neg Hx   . Rectal cancer Neg Hx   . Stomach cancer Neg Hx     Social History Social History  Substance  Use Topics  . Smoking status: Never Smoker  . Smokeless tobacco: Never Used  . Alcohol use No     Allergies   Patient has no known allergies.   Review of Systems Review of Systems ROS reviewed and all are negative for acute change except as noted in the HPI.  Physical Exam Updated Vital Signs BP 126/84 (BP Location: Right Arm)   Pulse 84   Temp 98 F (36.7 C) (Oral)   Resp 16   SpO2 99%   Physical Exam  Constitutional: She is oriented to person, place, and time. Vital signs are normal. She appears well-developed and well-nourished. No distress.  HENT:  Head: Normocephalic and atraumatic.  Right Ear: Hearing, tympanic membrane, external ear and ear canal normal.  Left Ear: Hearing, tympanic membrane, external ear and ear canal normal.  Nose: Nose normal.  Mouth/Throat: Uvula is midline, oropharynx is clear and moist and mucous membranes are normal. No trismus in the jaw. No oropharyngeal exudate, posterior oropharyngeal erythema or tonsillar abscesses.  Eyes: Pupils are equal, round, and reactive to light. Conjunctivae and EOM are normal.  Neck: Normal range of motion. Neck supple. No tracheal deviation present.  Cardiovascular: Normal rate, regular rhythm, S1 normal, S2 normal, normal heart sounds, intact distal pulses and normal pulses.   Pulmonary/Chest: Effort normal and breath sounds normal. No respiratory distress. She has no decreased breath sounds. She has no wheezes. She has no rhonchi. She has no rales.  Abdominal: Normal appearance and bowel sounds are normal. There is no tenderness.  Musculoskeletal: Normal range of motion.  Neurological: She is alert and oriented to person, place, and time.  Skin: Skin is warm and dry.  Psychiatric: She has a normal mood and affect. Her speech is normal and behavior is normal. Thought content normal.  Nursing note and vitals reviewed.  ED Treatments / Results  Labs (all labs ordered are listed, but only abnormal results are  displayed) Labs Reviewed  BASIC METABOLIC PANEL - Abnormal; Notable for the following:       Result Value   Potassium 3.2 (*)    Glucose, Bld 117 (*)    All other components within normal limits  CBC  I-STAT TROPONIN, ED  POCT I-STAT TROPONIN I  I-STAT TROPONIN, ED  POCT I-STAT TROPONIN I    EKG  EKG Interpretation None       Radiology Dg Chest 2 View  Result Date: 01/23/2017 CLINICAL DATA:  Chest tightness EXAM: CHEST  2 VIEW COMPARISON:  12/23/2015 FINDINGS: The heart size and mediastinal contours are within normal limits. Both lungs are clear. The visualized skeletal structures are unremarkable. IMPRESSION: No  active cardiopulmonary disease. Electronically Signed   By: Inez Catalina M.D.   On: 01/23/2017 20:52    Procedures Procedures (including critical care time)  Medications Ordered in ED Medications  potassium chloride SA (K-DUR,KLOR-CON) CR tablet 40 mEq (40 mEq Oral Given 01/23/17 2313)   Initial Impression / Assessment and Plan / ED Course  I have reviewed the triage vital signs and the nursing notes.  Pertinent labs & imaging results that were available during my care of the patient were reviewed by me and considered in my medical decision making (see chart for details).  Final Clinical Impressions(s) / ED Diagnoses  {I have reviewed and evaluated the relevant laboratory values. {I have reviewed and evaluated the relevant imaging studies. {I have interpreted the relevant EKG. {I have reviewed the relevant previous healthcare records.  {I obtained HPI from historian.   ED Course:  Assessment: Pt is a 66 y.o. female with a hx of GERD, Asthma, HTN, presents to the Emergency Department today due to chest tightness all today. Notes similar occurrences on Saturday as well as Sunday. Occurred again this morning at rest. Notes pressure in chest most of the day. Pt went walking this morning which seemed to relieve chest pressure. Denies chest pain currently. No N/V. No  diaphoresis. Noted headache this morning and checked her BP and said it was "high." Took BP meds and went for walk with improvement of BP. No hx ACS. No FH. Risk Factors include: HTN. No hx DVT/PE.Marland Kitchen Risk Factors HTN. Given potassium in ED due to CMP showing 3.2. Patient is to be discharged with recommendation to follow up with PCP in regards to today's hospital visit. Chest pain is not likely of cardiac or pulmonary etiology d/t presentation, perc negative, VSS, no tracheal deviation, no JVD or new murmur, RRR, breath sounds equal bilaterally, EKG without acute abnormalities, negative troponin x2, and negative CXR. Heart Score 2. Pt has been advised to  return to the ED is CP becomes exertional, associated with diaphoresis or nausea, radiates to left jaw/arm, worsens or becomes concerning in any way. Pt appears reliable for follow up and is agreeable to discharge. Patient is in no acute distress. Vital Signs are stable. Patient is able to ambulate. Patient able to tolerate PO.   Disposition/Plan:  DC Home Additional Verbal discharge instructions given and discussed with patient.  Pt Instructed to f/u with PCP in the next week for evaluation and treatment of symptoms. Return precautions given Pt acknowledges and agrees with plan  Supervising Physician Tegeler, Gwenyth Allegra, *  Final diagnoses:  Chest pain, unspecified type  Hypertension, unspecified type    New Prescriptions New Prescriptions   No medications on file     Shary Decamp, Hershal Coria 01/24/17 0123    Tegeler, Gwenyth Allegra, MD 01/24/17 [phone]

## 2017-01-23 NOTE — ED Triage Notes (Signed)
Pt complains of chest tightness all day today and she states her blood pressure is high and she has a headache

## 2017-01-24 LAB — POCT I-STAT TROPONIN I: Troponin i, poc: 0 ng/mL (ref 0.00–0.08)

## 2017-01-24 NOTE — Discharge Instructions (Signed)
Please read and follow all provided instructions.  Your diagnoses today include:  1. Chest pain, unspecified type   2. Hypertension, unspecified type     Tests performed today include: An EKG of your heart A chest x-ray Cardiac enzymes - a blood test for heart muscle damage Blood counts and electrolytes Vital signs. See below for your results today.   Medications prescribed:   Take any prescribed medications only as directed.  Follow-up instructions: Please follow-up with your primary care provider as soon as you can for further evaluation of your symptoms.   Return instructions:  SEEK IMMEDIATE MEDICAL ATTENTION IF: You have severe chest pain, especially if the pain is crushing or pressure-like and spreads to the arms, back, neck, or jaw, or if you have sweating, nausea (feeling sick to your stomach), or shortness of breath. THIS IS AN EMERGENCY. Don't wait to see if the pain will go away. Get medical help at once. Call 911 or 0 (operator). DO NOT drive yourself to the hospital.  Your chest pain gets worse and does not go away with rest.  You have an attack of chest pain lasting longer than usual, despite rest and treatment with the medications your caregiver has prescribed.  You wake from sleep with chest pain or shortness of breath. You feel dizzy or faint. You have chest pain not typical of your usual pain for which you originally saw your caregiver.  You have any other emergent concerns regarding your health.  Additional Information: Chest pain comes from many different causes. Your caregiver has diagnosed you as having chest pain that is not specific for one problem, but does not require admission.  You are at low risk for an acute heart condition or other serious illness.   Your vital signs today were: BP 126/84 (BP Location: Right Arm)    Pulse 84    Temp 98 F (36.7 C) (Oral)    Resp 16    SpO2 99%  If your blood pressure (BP) was elevated above 135/85 this visit, please  have this repeated by your doctor within one month. --------------

## 2017-04-10 ENCOUNTER — Other Ambulatory Visit: Payer: Self-pay | Admitting: Physician Assistant

## 2017-04-10 DIAGNOSIS — Z1231 Encounter for screening mammogram for malignant neoplasm of breast: Secondary | ICD-10-CM

## 2017-04-23 ENCOUNTER — Ambulatory Visit: Payer: BLUE CROSS/BLUE SHIELD

## 2017-04-24 ENCOUNTER — Ambulatory Visit
Admission: RE | Admit: 2017-04-24 | Discharge: 2017-04-24 | Disposition: A | Discharge status: Home / Self Care | Payer: BLUE CROSS/BLUE SHIELD | Source: Ambulatory Visit | Attending: Physician Assistant | Admitting: Physician Assistant

## 2017-04-24 DIAGNOSIS — Z1231 Encounter for screening mammogram for malignant neoplasm of breast: Secondary | ICD-10-CM

## 2017-05-16 ENCOUNTER — Encounter (HOSPITAL_COMMUNITY): Payer: Self-pay

## 2018-04-09 ENCOUNTER — Other Ambulatory Visit: Payer: Self-pay | Admitting: Physician Assistant

## 2018-04-09 DIAGNOSIS — Z1231 Encounter for screening mammogram for malignant neoplasm of breast: Secondary | ICD-10-CM

## 2018-05-14 ENCOUNTER — Ambulatory Visit
Admission: RE | Admit: 2018-05-14 | Discharge: 2018-05-14 | Disposition: A | Discharge status: Home / Self Care | Payer: BLUE CROSS/BLUE SHIELD | Source: Ambulatory Visit | Attending: Physician Assistant | Admitting: Physician Assistant

## 2018-05-14 DIAGNOSIS — Z1231 Encounter for screening mammogram for malignant neoplasm of breast: Secondary | ICD-10-CM

## 2019-04-17 ENCOUNTER — Other Ambulatory Visit: Payer: Self-pay | Admitting: Physician Assistant

## 2019-04-17 DIAGNOSIS — Z1231 Encounter for screening mammogram for malignant neoplasm of breast: Secondary | ICD-10-CM

## 2019-06-05 ENCOUNTER — Other Ambulatory Visit: Payer: Self-pay

## 2019-06-05 ENCOUNTER — Ambulatory Visit
Admission: RE | Admit: 2019-06-05 | Discharge: 2019-06-05 | Disposition: A | Discharge status: Home / Self Care | Payer: Medicare Other | Source: Ambulatory Visit | Attending: Physician Assistant | Admitting: Physician Assistant

## 2019-06-05 DIAGNOSIS — Z1231 Encounter for screening mammogram for malignant neoplasm of breast: Secondary | ICD-10-CM

## 2020-03-29 ENCOUNTER — Other Ambulatory Visit: Payer: Self-pay | Admitting: Physician Assistant

## 2020-03-29 DIAGNOSIS — Z780 Asymptomatic menopausal state: Secondary | ICD-10-CM

## 2020-06-17 ENCOUNTER — Other Ambulatory Visit: Payer: Self-pay | Admitting: Physician Assistant

## 2020-06-17 DIAGNOSIS — Z1231 Encounter for screening mammogram for malignant neoplasm of breast: Secondary | ICD-10-CM

## 2020-07-06 ENCOUNTER — Other Ambulatory Visit: Payer: Medicare Other

## 2020-07-07 ENCOUNTER — Ambulatory Visit
Admission: RE | Admit: 2020-07-07 | Discharge: 2020-07-07 | Disposition: A | Discharge status: Home / Self Care | Payer: Medicare Other | Source: Ambulatory Visit | Attending: Physician Assistant | Admitting: Physician Assistant

## 2020-07-07 ENCOUNTER — Other Ambulatory Visit: Payer: Self-pay

## 2020-07-07 DIAGNOSIS — Z780 Asymptomatic menopausal state: Secondary | ICD-10-CM | POA: Diagnosis not present

## 2020-07-07 DIAGNOSIS — Z13820 Encounter for screening for osteoporosis: Secondary | ICD-10-CM | POA: Diagnosis not present

## 2020-07-29 ENCOUNTER — Other Ambulatory Visit: Payer: Self-pay

## 2020-07-29 ENCOUNTER — Ambulatory Visit
Admission: RE | Admit: 2020-07-29 | Discharge: 2020-07-29 | Disposition: A | Discharge status: Home / Self Care | Payer: Medicare Other | Source: Ambulatory Visit | Attending: Physician Assistant | Admitting: Physician Assistant

## 2020-07-29 ENCOUNTER — Other Ambulatory Visit: Payer: Self-pay | Admitting: Physician Assistant

## 2020-07-29 DIAGNOSIS — Z1231 Encounter for screening mammogram for malignant neoplasm of breast: Secondary | ICD-10-CM

## 2020-09-10 ENCOUNTER — Other Ambulatory Visit: Payer: Self-pay

## 2020-09-10 ENCOUNTER — Ambulatory Visit
Admission: RE | Admit: 2020-09-10 | Discharge: 2020-09-10 | Disposition: A | Discharge status: Home / Self Care | Payer: Medicare Other | Source: Ambulatory Visit | Attending: Physician Assistant | Admitting: Physician Assistant

## 2020-09-10 DIAGNOSIS — Z1231 Encounter for screening mammogram for malignant neoplasm of breast: Secondary | ICD-10-CM

## 2020-09-28 DIAGNOSIS — H04123 Dry eye syndrome of bilateral lacrimal glands: Secondary | ICD-10-CM | POA: Diagnosis not present

## 2020-09-28 DIAGNOSIS — H40033 Anatomical narrow angle, bilateral: Secondary | ICD-10-CM | POA: Diagnosis not present

## 2020-10-17 ENCOUNTER — Emergency Department (HOSPITAL_BASED_OUTPATIENT_CLINIC_OR_DEPARTMENT_OTHER): Payer: Medicare Other

## 2020-10-17 ENCOUNTER — Other Ambulatory Visit: Payer: Self-pay

## 2020-10-17 ENCOUNTER — Emergency Department (HOSPITAL_BASED_OUTPATIENT_CLINIC_OR_DEPARTMENT_OTHER)
Admission: EM | Admit: 2020-10-17 | Discharge: 2020-10-17 | Disposition: A | Discharge status: Home / Self Care | Payer: Medicare Other | Attending: Emergency Medicine | Admitting: Emergency Medicine

## 2020-10-17 ENCOUNTER — Encounter (HOSPITAL_BASED_OUTPATIENT_CLINIC_OR_DEPARTMENT_OTHER): Payer: Self-pay | Admitting: Emergency Medicine

## 2020-10-17 DIAGNOSIS — R0602 Shortness of breath: Secondary | ICD-10-CM | POA: Diagnosis not present

## 2020-10-17 DIAGNOSIS — Z7982 Long term (current) use of aspirin: Secondary | ICD-10-CM | POA: Diagnosis not present

## 2020-10-17 DIAGNOSIS — Z8616 Personal history of COVID-19: Secondary | ICD-10-CM | POA: Diagnosis not present

## 2020-10-17 DIAGNOSIS — I10 Essential (primary) hypertension: Secondary | ICD-10-CM | POA: Insufficient documentation

## 2020-10-17 DIAGNOSIS — U071 COVID-19: Secondary | ICD-10-CM

## 2020-10-17 DIAGNOSIS — J9811 Atelectasis: Secondary | ICD-10-CM | POA: Diagnosis not present

## 2020-10-17 DIAGNOSIS — J45909 Unspecified asthma, uncomplicated: Secondary | ICD-10-CM | POA: Diagnosis not present

## 2020-10-17 DIAGNOSIS — Z79899 Other long term (current) drug therapy: Secondary | ICD-10-CM | POA: Diagnosis not present

## 2020-10-17 DIAGNOSIS — R0789 Other chest pain: Secondary | ICD-10-CM | POA: Diagnosis not present

## 2020-10-17 DIAGNOSIS — R059 Cough, unspecified: Secondary | ICD-10-CM | POA: Diagnosis not present

## 2020-10-17 DIAGNOSIS — R079 Chest pain, unspecified: Secondary | ICD-10-CM

## 2020-10-17 HISTORY — DX: COVID-19: U07.1

## 2020-10-17 LAB — COMPREHENSIVE METABOLIC PANEL
ALT: 18 U/L (ref 0–44)
AST: 23 U/L (ref 15–41)
Albumin: 3.7 g/dL (ref 3.5–5.0)
Alkaline Phosphatase: 111 U/L (ref 38–126)
Anion gap: 9 (ref 5–15)
BUN: 9 mg/dL (ref 8–23)
CO2: 26 mmol/L (ref 22–32)
Calcium: 9 mg/dL (ref 8.9–10.3)
Chloride: 102 mmol/L (ref 98–111)
Creatinine, Ser: 0.62 mg/dL (ref 0.44–1.00)
GFR, Estimated: >60 mL/min (ref >60)
Glucose, Bld: 112 mg/dL — ABNORMAL HIGH (ref 70–99)
Potassium: 3.9 mmol/L (ref 3.5–5.1)
Sodium: 137 mmol/L (ref 135–145)
Total Bilirubin: 0.4 mg/dL (ref 0.3–1.2)
Total Protein: 8 g/dL (ref 6.5–8.1)

## 2020-10-17 LAB — CBC WITH DIFFERENTIAL/PLATELET
Abs Immature Granulocytes: 0.02 10*3/uL (ref 0.00–0.07)
Basophils Absolute: 0 10*3/uL (ref 0.0–0.1)
Basophils Relative: 0 %
Eosinophils Absolute: 0.1 10*3/uL (ref 0.0–0.5)
Eosinophils Relative: 2 %
HCT: 39 % (ref 36.0–46.0)
Hemoglobin: 12.7 g/dL (ref 12.0–15.0)
Immature Granulocytes: 0 %
Lymphocytes Relative: 31 %
Lymphs Abs: 2.1 10*3/uL (ref 0.7–4.0)
MCH: 29 pg (ref 26.0–34.0)
MCHC: 32.6 g/dL (ref 30.0–36.0)
MCV: 89 fL (ref 80.0–100.0)
Monocytes Absolute: 0.5 10*3/uL (ref 0.1–1.0)
Monocytes Relative: 8 %
Neutro Abs: 4.1 10*3/uL (ref 1.7–7.7)
Neutrophils Relative %: 59 %
Platelets: 240 10*3/uL (ref 150–400)
RBC: 4.38 MIL/uL (ref 3.87–5.11)
RDW: 13.6 % (ref 11.5–15.5)
WBC: 6.9 10*3/uL (ref 4.0–10.5)
nRBC: 0 % (ref 0.0–0.2)

## 2020-10-17 LAB — D-DIMER, QUANTITATIVE: D-Dimer, Quant: 0.83 ug/mL-FEU — ABNORMAL HIGH (ref 0.00–0.50)

## 2020-10-17 LAB — TROPONIN I (HIGH SENSITIVITY)
Troponin I (High Sensitivity): 2 ng/L (ref <18)
Troponin I (High Sensitivity): 2 ng/L (ref <18)

## 2020-10-17 LAB — LIPASE, BLOOD: Lipase: 32 U/L (ref 11–51)

## 2020-10-17 LAB — SARS CORONAVIRUS 2 (TAT 6-24 HRS): SARS Coronavirus 2: POSITIVE — AB

## 2020-10-17 MED ORDER — IOHEXOL 350 MG/ML SOLN
100.0000 mL | Freq: Once | INTRAVENOUS | Status: AC | PRN
  Administered 2020-10-17: 86 mL via INTRAVENOUS

## 2020-10-17 MED ORDER — DIPHENHYDRAMINE HCL 50 MG/ML IJ SOLN
50.0000 mg | Freq: Once | INTRAMUSCULAR | Status: DC
  Filled 2020-10-17: qty 1

## 2020-10-17 NOTE — ED Notes (Addendum)
States," I am itching all over" ED MD informed, denies difficulty breathing or throat swelling

## 2020-10-17 NOTE — ED Notes (Signed)
ED Provider at bedside. 

## 2020-10-17 NOTE — ED Triage Notes (Signed)
Pt c/o tested positive for COVID this morning. Pt c/o cough onset Wednesday. Pt reports that other family members tested positive for COVID as well.

## 2020-10-17 NOTE — ED Provider Notes (Signed)
[facility] EMERGENCY DEPARTMENT Provider Note   CSN: [id number] Arrival date & time: 10/17/20  9169     History Chief Complaint  Patient presents with  . Cough    Emily Clarke is a 70 y.o. female.  HPI      70 year old female with history of hypertension, SVT, asthma, presents with concern for cough, congestion and positive Covid test at home and while in the emergency department, she also developed right side lower chest pain worse with deep breaths.  Cough began Wednesday, Thursday felt self, felt fine Friday Saturday, took temperature no fever. Morning took test and was positive.  Vaccines, booster  Just started having RUQ/right lower chest pain while in the ED Feels like pressure when breath in, localized in area on right , now feeling it in back with breaths.    Past Medical History:  Diagnosis Date  . Allergy    bronchitis  . Asthma   . Chronic back pain   . COVID   . GERD (gastroesophageal reflux disease)    off meds currently  . Headache   . Hypertension   . Rotator cuff syndrome of left shoulder    pt denies  . Scoliosis   . SVT (supraventricular tachycardia) ([place])   . Tachycardia     Patient Active Problem List   Diagnosis Date Noted  . Cutaneous skin tags 09/23/2015  . Screening for colon cancer 07/01/2015  . Plantar callus 12/07/2014  . Hypokalemia 12/07/2014  . Cough 04/06/2014  . Essential hypertension, benign 04/06/2014  . Essential hypertension 11/18/2013  . SVT (supraventricular tachycardia) ([place]) 08/12/2013  . Chest pain 08/11/2013  . Levoscoliosis 04/07/2013  . Hepatic steatosis 01/16/2013  . Diverticulosis of jejunum without diverticulitis 01/16/2013  . Abnormal CT of the abdomen 01/16/2013  . Abdominal pain, other specified site 01/16/2013  . Low HDL (under 40) 10/13/2012  . Prediabetes 10/13/2012  . Chronic cough 10/11/2012  . Reactive airway disease 10/11/2012  . History of asthma 10/11/2012  . Ingrown toenail  10/11/2012  . Allergic rhinitis 10/11/2012  . Pleuritic chest pain 10/11/2012  . Tachycardia     Past Surgical History:  Procedure Laterality Date  . ABDOMINAL HYSTERECTOMY    . BREAST CYST ASPIRATION Left 2014  . CHOLECYSTECTOMY    . COLONOSCOPY    . POLYPECTOMY    . TUBAL LIGATION       OB History    Gravida  3   Para  3   Term  3   Preterm      AB      Living  2     SAB      IAB      Ectopic      Multiple      Live Births              Family History  Problem Relation Age of Onset  . Heart disease Mother   . Hyperlipidemia Mother   . Heart attack Mother   . Prostate cancer Father   . Colon polyps Father   . Heart disease Father   . Hyperlipidemia Father   . Heart attack Father   . Breast cancer Sister 37  . Colon cancer Neg Hx   . Esophageal cancer Neg Hx   . Rectal cancer Neg Hx   . Stomach cancer Neg Hx     Social History   Tobacco Use  . Smoking status: Never Smoker  . Smokeless tobacco: Never Used  Vaping Use  . Vaping Use: Never used  Substance Use Topics  . Alcohol use: No  . Drug use: No    Home Medications Prior to Admission medications   Medication Sig Start Date End Date Taking? Authorizing Provider  olmesartan (BENICAR) 40 MG tablet Take 40 mg by mouth daily.   Yes [provider]  Rosuvastatin Calcium 10 MG CPSP Take by mouth.   Yes [provider]  albuterol (PROVENTIL HFA;VENTOLIN HFA) 108 (90 Base) MCG/ACT inhaler Inhale 2 puffs into the lungs every 6 (six) hours as needed for wheezing or shortness of breath. 08/31/15   Charlott Rakes, MD  albuterol (PROVENTIL) (2.5 MG/3ML) 0.083% nebulizer solution Take 3 mLs (2.5 mg total) by nebulization every 6 (six) hours as needed for wheezing or shortness of breath. Patient not taking: Reported on 04/06/2016 08/31/15   Charlott Rakes, MD  aspirin EC 81 MG tablet Take 1 tablet (81 mg total) by mouth daily. 08/23/15   Pattricia Boss, MD  ipratropium (ATROVENT) 0.02  % nebulizer solution Take 2.5 mLs (0.5 mg total) by nebulization 4 (four) times daily. Use with your albuterol solution Patient not taking: Reported on 04/06/2016 10/24/15   Charlesetta Shanks, MD  losartan (COZAAR) 100 MG tablet Take 1 tablet by mouth daily. 01/02/17   [provider]  metoprolol succinate (TOPROL-XL) 25 MG 24 hr tablet Take 1 tablet (25 mg total) by mouth daily. 08/23/15   Pattricia Boss, MD    Allergies    Contrast media [iodinated diagnostic agents]  Review of Systems   Review of Systems  Constitutional: Negative for fever.  HENT: Positive for congestion. Negative for sore throat.   Respiratory: Positive for cough. Negative for shortness of breath.   Cardiovascular: Negative for chest pain.  Gastrointestinal: Positive for abdominal pain (just started). Negative for constipation, diarrhea, nausea and vomiting.  Genitourinary: Negative for dysuria.  Skin: Negative for rash.  Neurological: Positive for headaches (now resolved). Negative for syncope.    Physical Exam Updated Vital Signs BP (!) 178/92 (BP Location: Right Arm)   Pulse 61   Temp 98 F (36.7 C) (Oral)   Resp 18   Ht 5' 4.5" (1.638 m)   Wt 70.8 kg   SpO2 95%   BMI 26.36 kg/m   Physical Exam Vitals and nursing note reviewed.  Constitutional:      General: She is not in acute distress.    Appearance: She is well-developed. She is not diaphoretic.  HENT:     Head: Normocephalic and atraumatic.  Eyes:     Conjunctiva/sclera: Conjunctivae normal.  Cardiovascular:     Rate and Rhythm: Normal rate and regular rhythm.     Heart sounds: Normal heart sounds. No murmur heard. No friction rub. No gallop.   Pulmonary:     Effort: Pulmonary effort is normal. No respiratory distress.     Breath sounds: Normal breath sounds. No wheezing or rales.  Abdominal:     General: There is no distension.     Palpations: Abdomen is soft.     Tenderness: There is no abdominal tenderness. There is no guarding.   Musculoskeletal:        General: No tenderness.     Cervical back: Normal range of motion.  Skin:    General: Skin is warm and dry.     Findings: No erythema or rash.  Neurological:     Mental Status: She is alert and oriented to person, place, and time.     ED  Results / Procedures / Treatments   Labs (all labs ordered are listed, but only abnormal results are displayed) Labs Reviewed  SARS CORONAVIRUS 2 (TAT 6-24 HRS) - Abnormal; Notable for the following components:      Result Value   SARS Coronavirus 2 POSITIVE (*)    All other components within normal limits  COMPREHENSIVE METABOLIC PANEL - Abnormal; Notable for the following components:   Glucose, Bld 112 (*)    All other components within normal limits  D-DIMER, QUANTITATIVE - Abnormal; Notable for the following components:   D-Dimer, Quant 0.83 (*)    All other components within normal limits  CBC WITH DIFFERENTIAL/PLATELET  LIPASE, BLOOD  TROPONIN I (HIGH SENSITIVITY)  TROPONIN I (HIGH SENSITIVITY)    EKG None  Radiology CT Angio Chest PE W and/or Wo Contrast  Result Date: 10/17/2020 CLINICAL DATA:  COVID positive. Slight cough, no fever, no chest pain. No respiratory distress. Elevated D-dimer. EXAM: CT ANGIOGRAPHY CHEST WITH CONTRAST TECHNIQUE: Multidetector CT imaging of the chest was performed using the standard protocol during bolus administration of intravenous contrast. Multiplanar CT image reconstructions and MIPs were obtained to evaluate the vascular anatomy. CONTRAST:  55mL OMNIPAQUE IOHEXOL 350 MG/ML SOLN COMPARISON:  None. FINDINGS: Cardiovascular: Satisfactory opacification of the pulmonary arteries to the segmental level. No evidence of pulmonary embolism. Normal heart size. No pericardial effusion. Mediastinum/Nodes: No enlarged mediastinal, hilar, or axillary lymph nodes. Thyroid gland, trachea, and esophagus demonstrate no significant findings. Lungs/Pleura: Scattered mild bibasilar linear opacities,  most likely representing atelectasis. No focal consolidation. No pneumothorax or pleural effusion. The central airways are patent. Upper Abdomen: There is a cyst in the left kidney. Otherwise unremarkable. Musculoskeletal: No chest wall abnormality. No acute or significant osseous findings. Review of the MIP images confirms the above findings. IMPRESSION: 1. No evidence of pulmonary embolism or acute intrathoracic process. 2. Mild bibasilar linear opacities favored to represent atelectasis. Electronically Signed   By: Audie Pinto M.D.   On: 10/17/2020 14:31   DG Chest Portable 1 View  Result Date: 10/17/2020 CLINICAL DATA:  Cough and shortness of breath for 3 days. Positive COVID. EXAM: PORTABLE CHEST 1 VIEW COMPARISON:  January 23, 2017 FINDINGS: The heart size and mediastinal contours are within normal limits. Both lungs are clear. The visualized skeletal structures are unremarkable. IMPRESSION: No active disease. Electronically Signed   By: Abelardo Diesel M.D.   On: 10/17/2020 12:04    Procedures Procedures   Medications Ordered in ED Medications  iohexol (OMNIPAQUE) 350 MG/ML injection 100 mL (86 mLs Intravenous Contrast Given 10/17/20 1338)    ED Course  I have reviewed the triage vital signs and the nursing notes.  Pertinent labs & imaging results that were available during my care of the patient were reviewed by me and considered in my medical decision making (see chart for details).    MDM Rules/Calculators/A&P                          70 year old female with history of hypertension, SVT, asthma, presents with concern for cough, congestion and positive Covid test at home and while in the emergency department, she also developed right side lower chest pain worse with deep breaths.   Given development of right sided chest pain, labs including ddimer, troponin, EKG, CXR obtained.  Troponin negative, low suspicion for ACS by history and exam.  EKG shows no acute findings.  Labs show no  significant abnormalities with exception of  positive D-dimer.  CT PE study was completed which showed no evidence of PE.  Suspect the right lower chest pain she is experiencing with cough and deep breaths is related to muscular pain from coughing.  Suspect likely COVID-19 as etiology of her symptoms.  Ordered confirmatory PCR test and Covid ambulatory treatment consultation.    Final Clinical Impression(s) / ED Diagnoses Final diagnoses:  COVID-19  Right-sided chest pain    Rx / DC Orders ED Discharge Orders         Ordered    Ambulatory referral for Covid Treatment        10/17/20 1437           Gareth Morgan, MD 10/18/20 [phone]

## 2020-10-18 ENCOUNTER — Other Ambulatory Visit: Payer: Self-pay | Admitting: Physician Assistant

## 2020-10-18 ENCOUNTER — Telehealth: Payer: Self-pay | Admitting: Unknown Physician Specialty

## 2020-10-18 ENCOUNTER — Telehealth: Payer: Self-pay

## 2020-10-18 DIAGNOSIS — U071 COVID-19: Secondary | ICD-10-CM

## 2020-10-18 DIAGNOSIS — Z8709 Personal history of other diseases of the respiratory system: Secondary | ICD-10-CM

## 2020-10-18 DIAGNOSIS — I471 Supraventricular tachycardia: Secondary | ICD-10-CM

## 2020-10-18 DIAGNOSIS — I10 Essential (primary) hypertension: Secondary | ICD-10-CM

## 2020-10-18 NOTE — Addendum Note (Signed)
Addended by: Eileen Stanford on: 10/18/2020 07:21 PM   Modules accepted: Orders, SmartSet

## 2020-10-18 NOTE — Telephone Encounter (Signed)
Called to discuss with patient about COVID-19 symptoms and the use of one of the available treatments for those with mild to moderate Covid symptoms and at a high risk of hospitalization.  Pt appears to qualify for outpatient treatment due to co-morbid conditions and/or a member of an at-risk group in accordance with the FDA Emergency Use Authorization.    Symptom onset:10/13/20 Cough Vaccinated: Yes Booster? Yes Immunocompromised? No Qualifiers: HTN, SVT,Asthma  Pt. Would like to speak with APP.  Emily Clarke

## 2020-10-18 NOTE — Telephone Encounter (Signed)
Pt. Called back and states she is now coughing. States she changed her mind in regard to further treatment. Information to APP.

## 2020-10-18 NOTE — Progress Notes (Signed)
I connected by phone with Signe Colt on 10/18/2020 at 1:27 PM to discuss the potential use of a new treatment for mild to moderate COVID-19 viral infection in non-hospitalized patients.  This patient is a 70 y.o. female that meets the FDA criteria for Emergency Use Authorization of COVID monoclonal antibody bebtelovimab.  Has a (+) direct SARS-CoV-2 viral test result  Has mild or moderate COVID-19   Is NOT hospitalized due to COVID-19  Is within 10 days of symptom onset  Has at least one of the high risk factor(s) for progression to severe COVID-19 and/or hospitalization as defined in EUA.  Specific high risk criteria : Older age (>/= 68 yo), BMI > 25, Cardiovascular disease or hypertension and Chronic Lung Disease   I have spoken and communicated the following to the patient or parent/caregiver regarding COVID monoclonal antibody treatment:  1. FDA has authorized the emergency use for the treatment of mild to moderate COVID-19 in adults and pediatric patients with positive results of direct SARS-CoV-2 viral testing who are 108 years of age and older weighing at least 40 kg, and who are at high risk for progressing to severe COVID-19 and/or hospitalization.  2. The significant known and potential risks and benefits of COVID monoclonal antibody, and the extent to which such potential risks and benefits are unknown.  3. Information on available alternative treatments and the risks and benefits of those alternatives, including clinical trials.  4. Patients treated with COVID monoclonal antibody should continue to self-isolate and use infection control measures (e.g., wear mask, isolate, social distance, avoid sharing personal items, clean and disinfect "high touch" surfaces, and frequent handwashing) according to CDC guidelines.   5. The patient or parent/caregiver has the option to accept or refuse COVID monoclonal antibody treatment.  6. Discussion about the monoclonal antibody infusion  does not ensure treatment. The patient will be placed on a list and scheduled according to risk, symptom onset and availability. A scheduler will reach to the patient to let them know if we can accommodate their infusion or not.  After reviewing this information with the patient, the patient has agreed to receive one of the available covid 19 monoclonal antibodies and will be provided an appropriate fact sheet prior to infusion. Angelena Form, PA-C 10/18/2020 1:27 PM

## 2020-10-18 NOTE — Telephone Encounter (Signed)
Called to discuss with patient about COVID-19 symptoms and the use of one of the available treatments for those with mild to moderate Covid symptoms and at a high risk of hospitalization.  Pt appears to qualify for outpatient treatment due to co-morbid conditions and/or a member of an at-risk group in accordance with the FDA Emergency Use Authorization.    She is at day 6 today and reports "feeling fine" without any symptoms.  Recommended continued supportive care with oral or IV treatments no indicated as currently asymptomatic.    Emily Clarke

## 2020-10-19 ENCOUNTER — Other Ambulatory Visit: Payer: Self-pay

## 2020-10-19 ENCOUNTER — Ambulatory Visit (INDEPENDENT_AMBULATORY_CARE_PROVIDER_SITE_OTHER): Payer: Medicare Other

## 2020-10-19 DIAGNOSIS — U071 COVID-19: Secondary | ICD-10-CM

## 2020-10-19 DIAGNOSIS — I10 Essential (primary) hypertension: Secondary | ICD-10-CM

## 2020-10-19 DIAGNOSIS — I471 Supraventricular tachycardia: Secondary | ICD-10-CM

## 2020-10-19 DIAGNOSIS — Z8709 Personal history of other diseases of the respiratory system: Secondary | ICD-10-CM

## 2020-10-19 MED ORDER — ALBUTEROL SULFATE HFA 108 (90 BASE) MCG/ACT IN AERS: 2.0000 | INHALATION_SPRAY | Freq: Once | RESPIRATORY_TRACT | Status: AC | PRN

## 2020-10-19 MED ORDER — SODIUM CHLORIDE 0.9 % IV SOLN: INTRAVENOUS | Status: AC | PRN

## 2020-10-19 MED ORDER — EPINEPHRINE 0.3 MG/0.3ML IJ SOAJ: 0.3000 mg | Freq: Once | INTRAMUSCULAR | Status: AC | PRN

## 2020-10-19 MED ORDER — METHYLPREDNISOLONE SODIUM SUCC 125 MG IJ SOLR: 125.0000 mg | Freq: Once | INTRAMUSCULAR | Status: AC | PRN

## 2020-10-19 MED ORDER — BEBTELOVIMAB 175 MG/2 ML IV (EUA)
175.0000 mg | Freq: Once | INTRAMUSCULAR | Status: AC
  Administered 2020-10-19: 175 mg via INTRAVENOUS

## 2020-10-19 MED ORDER — DIPHENHYDRAMINE HCL 50 MG/ML IJ SOLN: 50.0000 mg | Freq: Once | INTRAMUSCULAR | Status: AC | PRN

## 2020-10-19 MED ORDER — FAMOTIDINE IN NACL 20-0.9 MG/50ML-% IV SOLN: 20.0000 mg | Freq: Once | INTRAVENOUS | Status: AC | PRN

## 2020-10-19 NOTE — Progress Notes (Signed)
Diagnosis: covid  Provider:  Marshell Garfinkel, MD  Procedure: Infusion  IV Type: Peripheral, IV Location: L Hand  Bebtelovimab, Dose: 175mg   Infusion Start Time: 1512  Infusion Stop Time: 1615  Post Infusion IV Care: Observation period completed and Peripheral IV Discontinued  Discharge: Condition: Good, Destination: Home . AVS provided to patient.   Performed by:  Arnoldo Morale, RN

## 2020-10-19 NOTE — Patient Instructions (Signed)
10 Things You Can Do to Manage Your COVID-19 Symptoms at Home If you have possible or confirmed COVID-19: 1. Stay home except to get medical care. 2. Monitor your symptoms carefully. If your symptoms get worse, call your healthcare provider immediately. 3. Get rest and stay hydrated. 4. If you have a medical appointment, call the healthcare provider ahead of time and tell them that you have or may have COVID-19. 5. For medical emergencies, call 911 and notify the dispatch personnel that you have or may have COVID-19. 6. Cover your cough and sneezes with a tissue or use the inside of your elbow. 7. Wash your hands often with soap and water for at least 20 seconds or clean your hands with an alcohol-based hand sanitizer that contains at least 60% alcohol. 8. As much as possible, stay in a specific room and away from other people in your home. Also, you should use a separate bathroom, if available. If you need to be around other people in or outside of the home, wear a mask. 9. Avoid sharing personal items with other people in your household, like dishes, towels, and bedding. 10. Clean all surfaces that are touched often, like counters, tabletops, and doorknobs. Use household cleaning sprays or wipes according to the label instructions. cdc.gov/coronavirus 01/16/2020 This information is not intended to replace advice given to you by your health care provider. Make sure you discuss any questions you have with your health care provider. Document Revised: 05/03/2020 Document Reviewed: 05/03/2020 Elsevier Patient Education  2021 Elsevier Inc.  What types of side effects do monoclonal antibody drugs cause?  Common side effects  In general, the more common side effects caused by monoclonal antibody drugs include: . Allergic reactions, such as hives or itching . Flu-like signs and symptoms, including chills, fatigue, fever, and muscle aches and pains . Nausea, vomiting . Diarrhea . Skin  rashes . Low blood pressure   The CDC is recommending patients who receive monoclonal antibody treatments wait at least 90 days before being vaccinated.  Currently, there are no data on the safety and efficacy of mRNA COVID-19 vaccines in persons who received monoclonal antibodies or convalescent plasma as part of COVID-19 treatment. Based on the estimated half-life of such therapies as well as evidence suggesting that reinfection is uncommon in the 90 days after initial infection, vaccination should be deferred for at least 90 days, as a precautionary measure until additional information becomes available, to avoid interference of the antibody treatment with vaccine-induced immune responses.  

## 2020-10-28 DIAGNOSIS — L810 Postinflammatory hyperpigmentation: Secondary | ICD-10-CM | POA: Diagnosis not present

## 2020-10-28 DIAGNOSIS — M12811 Other specific arthropathies, not elsewhere classified, right shoulder: Secondary | ICD-10-CM | POA: Diagnosis not present

## 2020-10-28 DIAGNOSIS — U099 Post covid-19 condition, unspecified: Secondary | ICD-10-CM | POA: Diagnosis not present

## 2020-11-04 DIAGNOSIS — E785 Hyperlipidemia, unspecified: Secondary | ICD-10-CM | POA: Diagnosis not present

## 2020-11-04 DIAGNOSIS — I10 Essential (primary) hypertension: Secondary | ICD-10-CM | POA: Diagnosis not present

## 2020-11-04 DIAGNOSIS — K219 Gastro-esophageal reflux disease without esophagitis: Secondary | ICD-10-CM | POA: Diagnosis not present

## 2020-11-04 DIAGNOSIS — J45909 Unspecified asthma, uncomplicated: Secondary | ICD-10-CM | POA: Diagnosis not present

## 2020-11-04 DIAGNOSIS — E1169 Type 2 diabetes mellitus with other specified complication: Secondary | ICD-10-CM | POA: Diagnosis not present

## 2020-11-09 DIAGNOSIS — M25511 Pain in right shoulder: Secondary | ICD-10-CM | POA: Diagnosis not present

## 2020-12-23 DIAGNOSIS — M12811 Other specific arthropathies, not elsewhere classified, right shoulder: Secondary | ICD-10-CM | POA: Diagnosis not present

## 2021-03-14 DIAGNOSIS — I10 Essential (primary) hypertension: Secondary | ICD-10-CM | POA: Diagnosis not present

## 2021-03-14 DIAGNOSIS — E785 Hyperlipidemia, unspecified: Secondary | ICD-10-CM | POA: Diagnosis not present

## 2021-03-31 DIAGNOSIS — J4521 Mild intermittent asthma with (acute) exacerbation: Secondary | ICD-10-CM | POA: Diagnosis not present

## 2021-03-31 DIAGNOSIS — E1169 Type 2 diabetes mellitus with other specified complication: Secondary | ICD-10-CM | POA: Diagnosis not present

## 2021-03-31 DIAGNOSIS — Z23 Encounter for immunization: Secondary | ICD-10-CM | POA: Diagnosis not present

## 2021-03-31 DIAGNOSIS — I10 Essential (primary) hypertension: Secondary | ICD-10-CM | POA: Diagnosis not present

## 2021-03-31 DIAGNOSIS — Z0000 Encounter for general adult medical examination without abnormal findings: Secondary | ICD-10-CM | POA: Diagnosis not present

## 2021-03-31 DIAGNOSIS — E785 Hyperlipidemia, unspecified: Secondary | ICD-10-CM | POA: Diagnosis not present

## 2021-04-20 DIAGNOSIS — J029 Acute pharyngitis, unspecified: Secondary | ICD-10-CM | POA: Diagnosis not present

## 2021-04-21 ENCOUNTER — Encounter: Payer: Self-pay | Admitting: Internal Medicine

## 2021-06-02 DIAGNOSIS — J029 Acute pharyngitis, unspecified: Secondary | ICD-10-CM | POA: Diagnosis not present

## 2021-06-02 DIAGNOSIS — R12 Heartburn: Secondary | ICD-10-CM | POA: Diagnosis not present

## 2021-06-09 ENCOUNTER — Other Ambulatory Visit: Payer: Self-pay

## 2021-06-09 ENCOUNTER — Ambulatory Visit (AMBULATORY_SURGERY_CENTER): Payer: Medicare Other

## 2021-06-09 ENCOUNTER — Encounter: Payer: Self-pay | Admitting: Internal Medicine

## 2021-06-09 VITALS — Ht 64.5 in | Wt 153.0 lb

## 2021-06-09 DIAGNOSIS — Z86010 Personal history of colonic polyps: Secondary | ICD-10-CM

## 2021-06-09 MED ORDER — PEG 3350-KCL-NA BICARB-NACL 420 G PO SOLR: 4000.0000 mL | Freq: Once | ORAL | 0 refills | Status: AC

## 2021-06-09 NOTE — Progress Notes (Signed)

## 2021-06-20 DIAGNOSIS — Z20822 Contact with and (suspected) exposure to covid-19: Secondary | ICD-10-CM | POA: Diagnosis not present

## 2021-06-23 ENCOUNTER — Ambulatory Visit (AMBULATORY_SURGERY_CENTER): Payer: Medicare Other | Admitting: Internal Medicine

## 2021-06-23 ENCOUNTER — Encounter: Payer: Self-pay | Admitting: Internal Medicine

## 2021-06-23 ENCOUNTER — Other Ambulatory Visit: Payer: Self-pay

## 2021-06-23 VITALS — BP 117/66 | HR 76 | Temp 97.9°F | Resp 20 | Ht 64.0 in | Wt 153.0 lb

## 2021-06-23 DIAGNOSIS — K623 Rectal prolapse: Secondary | ICD-10-CM

## 2021-06-23 DIAGNOSIS — D124 Benign neoplasm of descending colon: Secondary | ICD-10-CM

## 2021-06-23 DIAGNOSIS — Z86010 Personal history of colonic polyps: Secondary | ICD-10-CM

## 2021-06-23 DIAGNOSIS — J45909 Unspecified asthma, uncomplicated: Secondary | ICD-10-CM | POA: Diagnosis not present

## 2021-06-23 DIAGNOSIS — D128 Benign neoplasm of rectum: Secondary | ICD-10-CM

## 2021-06-23 MED ORDER — SODIUM CHLORIDE 0.9 % IV SOLN: 500.0000 mL | INTRAVENOUS | Status: DC

## 2021-06-23 NOTE — Op Note (Signed)
[facility] Patient Name: Emily Clarke Procedure Date: 06/23/2021 8:43 AM MRN: [id number] Endoscopist: Jerene Bears , MD Age: 70 Referring MD:  Date of Birth: 02/25/1951 Gender: Female Account #: [id number] Procedure:                Colonoscopy Indications:              Surveillance: Personal history of adenomatous polyp                            (without high risk features) on last colonoscopy 5                            years ago, Last colonoscopy: October 2017 Medicines:                Monitored Anesthesia Care Procedure:                Pre-Anesthesia Assessment:                           - Prior to the procedure, a History and Physical                            was performed, and patient medications and                            allergies were reviewed. The patient's tolerance of                            previous anesthesia was also reviewed. The risks                            and benefits of the procedure and the sedation                            options and risks were discussed with the patient.                            All questions were answered, and informed consent                            was obtained. Prior Anticoagulants: The patient has                            taken no previous anticoagulant or antiplatelet                            agents. ASA Grade Assessment: II - A patient with                            mild systemic disease. After reviewing the risks                            and benefits, the patient was deemed in  satisfactory condition to undergo the procedure.                           After obtaining informed consent, the colonoscope                            was passed under direct vision. Throughout the                            procedure, the patient's blood pressure, pulse, and                            oxygen saturations were monitored continuously. The                            Olympus  PCF-[id number] (#[id number]) Colonoscope was                            introduced through the anus and advanced to the                            cecum, identified by appendiceal orifice and                            ileocecal valve. The colonoscopy was performed                            without difficulty. The patient tolerated the                            procedure well. The quality of the bowel                            preparation was good. The ileocecal valve,                            appendiceal orifice, and rectum were photographed. Scope In: 8:56:03 AM Scope Out: 9:12:12 AM Scope Withdrawal Time: 0 hours 11 minutes 49 seconds  Total Procedure Duration: 0 hours 16 minutes 9 seconds  Findings:                 The digital rectal exam was normal.                           A 5 mm polyp was found in the descending colon. The                            polyp was sessile. The polyp was removed with a                            cold snare. Resection and retrieval were complete.                           A 4 mm polyp was found in the distal rectum  immediately above the dentate line. The polyp was                            sessile. The polyp was removed with a cold snare.                            Resection and retrieval were complete.                           A few small-mouthed diverticula were found in the                            sigmoid colon and descending colon.                           No additional abnormalities were found on                            retroflexion. Complications:            No immediate complications. Estimated Blood Loss:     Estimated blood loss was minimal. Impression:               - One 5 mm polyp in the descending colon, removed                            with a cold snare. Resected and retrieved.                           - One 4 mm polyp in the distal rectum, removed with                            a cold snare. Resected  and retrieved.                           - Diverticulosis in the sigmoid colon and in the                            descending colon. Recommendation:           - Patient has a contact number available for                            emergencies. The signs and symptoms of potential                            delayed complications were discussed with the                            patient. Return to normal activities tomorrow.                            Written discharge instructions were provided to the  patient.                           - Resume previous diet.                           - Continue present medications.                           - Await pathology results.                           - Repeat colonoscopy is recommended for                            surveillance. The colonoscopy date will be                            determined after pathology results from today's                            exam become available for review. Jerene Bears, MD 06/23/2021 9:15:19 AM This report has been signed electronically.

## 2021-06-23 NOTE — Progress Notes (Signed)
Pt waiting on transportation

## 2021-06-23 NOTE — Progress Notes (Signed)
° °GASTROENTEROLOGY PROCEDURE H&P NOTE  ° °Primary Care Physician: °Scifres, Dorothy, PA-C ° ° ° °Reason for Procedure:  History of adenomatous colon polyps ° °Plan:    Surveillance colonoscopy ° °Patient is appropriate for endoscopic procedure(s) in the ambulatory ([facility]) setting. ° °The nature of the procedure, as well as the risks, benefits, and alternatives were carefully and thoroughly reviewed with the patient. Ample time for discussion and questions allowed. The patient understood, was satisfied, and agreed to proceed.  ° ° ° °HPI: °Emily Clarke is a 70 y.o. female who presents for surveillance colonoscopy.  Medical history as below.  Tolerated the prep.  No recent chest pain or shortness of breath.  No abdominal pain today. ° °Past Medical History:  °Diagnosis Date  ° Asthma   ° inhaler- uses PRN  ° Chronic back pain   ° COVID   ° GERD (gastroesophageal reflux disease)   ° off meds currently  ° Headache   ° Hyperlipidemia   ° does not take meds- does not "like the wasy they make me feel"  ° Hypertension   ° on meds  ° Rotator cuff syndrome of left shoulder   ° pt denies  ° Scoliosis   ° Seasonal allergies   ° SVT (supraventricular tachycardia) (HCC)   ° Tachycardia   ° ° °Past Surgical History:  °Procedure Laterality Date  ° ABDOMINAL HYSTERECTOMY  1985  ° BREAST CYST ASPIRATION Left 2014  ° CHOLECYSTECTOMY  1986  ° COLONOSCOPY  2017  ° JMP-MAC-exc prep-polyps x 1  ° POLYPECTOMY  2017  ° TUBAL LIGATION  1983  ° WISDOM TOOTH EXTRACTION    ° ° °Prior to Admission medications   °Medication Sig Start Date End Date Taking? Authorizing Provider  °Ascorbic Acid (VITAMIN C) 500 MG CAPS Take 1 capsule by mouth daily at 6 (six) AM.   Yes [provider]  °aspirin EC 81 MG tablet Take 1 tablet (81 mg total) by mouth daily. 08/23/15  Yes Ray, Danielle, MD  °Blood Glucose Monitoring Suppl (ONETOUCH VERIO) w/Device KIT See admin instructions. 09/25/19  Yes [provider]  °calcium carbonate (OSCAL) 1500  (600 Ca) MG TABS tablet Take 1 tablet by mouth daily at 6 (six) AM.   Yes [provider]  °glucose blood (ONETOUCH VERIO) test strip Use to check your blood sugar 09/08/20  Yes [provider]  °Lancets (ONETOUCH DELICA PLUS LANCET30G) MISC  05/20/21  Yes [provider]  °Lancets (ONETOUCH ULTRASOFT) lancets See admin instructions. 09/25/19  Yes [provider]  °metoprolol succinate (TOPROL-XL) 25 MG 24 hr tablet Take 1 tablet (25 mg total) by mouth daily. 08/23/15  Yes Ray, Danielle, MD  °Omega-3 Fatty Acids (FISH OIL) 1000 MG CAPS Take 1 capsule by mouth daily.   Yes [provider]  °ONETOUCH VERIO test strip 1 each daily. 06/07/21  Yes [provider]  °fluticasone (FLONASE) 50 MCG/ACT nasal spray Place 1 spray into both nostrils daily as needed.    [provider]  °PROAIR RESPICLICK 108 (90 Base) MCG/ACT AEPB Inhale into the lungs. 03/31/21   [provider]  ° ° °Current Outpatient Medications  °Medication Sig Dispense Refill  ° Ascorbic Acid (VITAMIN C) 500 MG CAPS Take 1 capsule by mouth daily at 6 (six) AM.    ° aspirin EC 81 MG tablet Take 1 tablet (81 mg total) by mouth daily. 30 tablet 2  ° Blood Glucose Monitoring Suppl (ONETOUCH VERIO) w/Device KIT See admin instructions.    °   calcium carbonate (OSCAL) 1500 (600 Ca) MG TABS tablet Take 1 tablet by mouth daily at 6 (six) AM.    ° glucose blood (ONETOUCH VERIO) test strip Use to check your blood sugar    ° Lancets (ONETOUCH DELICA PLUS LANCET30G) MISC     ° Lancets (ONETOUCH ULTRASOFT) lancets See admin instructions.    ° metoprolol succinate (TOPROL-XL) 25 MG 24 hr tablet Take 1 tablet (25 mg total) by mouth daily. 90 tablet 0  ° Omega-3 Fatty Acids (FISH OIL) 1000 MG CAPS Take 1 capsule by mouth daily.    ° ONETOUCH VERIO test strip 1 each daily.    ° fluticasone (FLONASE) 50 MCG/ACT nasal spray Place 1 spray into both nostrils daily as needed.    ° PROAIR RESPICLICK 108 (90 Base)  MCG/ACT AEPB Inhale into the lungs.    ° °Current Facility-Administered Medications  °Medication Dose Route Frequency Provider Last Rate Last Admin  ° 0.9 %  sodium chloride infusion  500 mL Intravenous Continuous Pyrtle, Jay M, MD      ° 0.9 %  sodium chloride infusion   Intravenous PRN Thompson, Kathryn R, PA-C      ° 0.9 %  sodium chloride infusion  500 mL Intravenous Continuous Pyrtle, Jay M, MD      ° ° °Allergies as of 06/23/2021 - Review Complete 06/23/2021  °Allergen Reaction Noted  ° Amlodipine besylate  04/20/2021  ° Atorvastatin  04/20/2021  ° Cyclobenzaprine  04/20/2021  ° Hydrochlorothiazide  04/20/2021  ° Iodinated diagnostic agents Itching 10/17/2020  ° ° °Family History  °Problem Relation Age of Onset  ° Heart disease Mother   ° Hyperlipidemia Mother   ° Heart attack Mother   ° Prostate cancer Father   ° Colon polyps Father   ° Heart disease Father   ° Hyperlipidemia Father   ° Heart attack Father   ° Breast cancer Sister 67  ° Colon cancer Neg Hx   ° Esophageal cancer Neg Hx   ° Rectal cancer Neg Hx   ° Stomach cancer Neg Hx   ° ° °Social History  ° °Socioeconomic History  ° Marital status: Single  °  Spouse name: Not on file  ° Number of children: 3  ° Years of education: Not on file  ° Highest education level: Not on file  °Occupational History  °  Employer: [employer]  °Tobacco Use  ° Smoking status: Never  ° Smokeless tobacco: Never  °Vaping Use  ° Vaping Use: Never used  °Substance and Sexual Activity  ° Alcohol use: No  ° Drug use: No  ° Sexual activity: Yes  °  Birth control/protection: Surgical  °Other Topics Concern  ° Not on file  °Social History Narrative  ° Not on file  ° °Social Determinants of Health  ° °Financial Resource Strain: Not on file  °Food Insecurity: Not on file  °Transportation Needs: Not on file  °Physical Activity: Not on file  °Stress: Not on file  °Social Connections: Not on file  °Intimate Partner Violence: Not on file  ° ° °Physical Exam: °Vital signs in last 24 hours: °@BP  (!) 117/54    Pulse 69    Temp 97.9 °F (36.6 °C)    Ht 5' 4" (1.626 m)    Wt 153 lb (69.4 kg)    SpO2 98%    BMI 26.26 kg/m²  °GEN: NAD °EYE: Sclerae anicteric °ENT: MMM °CV: Non-tachycardic °Pulm: CTA b/l °GI: Soft, NT/ND °NEURO:  Alert & Oriented   NT/ND NEURO:  Alert & Oriented x 3   Zenovia Jarred, MD [facility]  06/23/2021 8:38 AM

## 2021-06-23 NOTE — Progress Notes (Signed)
Pt's states no medical or surgical changes since previsit or office visit.   Cw vitals.

## 2021-06-23 NOTE — Progress Notes (Signed)
0900 Ephedrine 10 mg given IV due to low BP, MD updated.

## 2021-06-23 NOTE — Progress Notes (Signed)
Called to room to assist during endoscopic procedure.  Patient ID and intended procedure confirmed with present staff. Received instructions for my participation in the procedure from the performing physician.  

## 2021-06-23 NOTE — Progress Notes (Signed)
Report given to PACU, vss 

## 2021-06-23 NOTE — Patient Instructions (Signed)
Resume previous medications.  Diverticulosis.  2 polyps removed and sent to pathology.  Await results for final recommendations.  Handouts on findings given to patient (polyps and diverticulosis).     YOU HAD AN ENDOSCOPIC PROCEDURE TODAY AT [facility]:   Refer to the procedure report that was given to you for any specific questions about what was found during the examination.  If the procedure report does not answer your questions, please call your gastroenterologist to clarify.  If you requested that your care partner not be given the details of your procedure findings, then the procedure report has been included in a sealed envelope for you to review at your convenience later.  YOU SHOULD EXPECT: Some feelings of bloating in the abdomen. Passage of more gas than usual.  Walking can help get rid of the air that was put into your GI tract during the procedure and reduce the bloating. If you had a lower endoscopy (such as a colonoscopy or flexible sigmoidoscopy) you may notice spotting of blood in your stool or on the toilet paper. If you underwent a bowel prep for your procedure, you may not have a normal bowel movement for a few days.  Please Note:  You might notice some irritation and congestion in your nose or some drainage.  This is from the oxygen used during your procedure.  There is no need for concern and it should clear up in a day or so.  SYMPTOMS TO REPORT IMMEDIATELY:  Following lower endoscopy (colonoscopy or flexible sigmoidoscopy):  Excessive amounts of blood in the stool  Significant tenderness or worsening of abdominal pains  Swelling of the abdomen that is new, acute  Fever of 100F or higher   For urgent or emergent issues, a gastroenterologist can be reached at any hour by calling [phone]. Do not use MyChart messaging for urgent concerns.    DIET:  We do recommend a small meal at first, but then you may proceed to your regular diet.  Drink plenty of  fluids but you should avoid alcoholic beverages for 24 hours.  ACTIVITY:  You should plan to take it easy for the rest of today and you should NOT DRIVE or use heavy machinery until tomorrow (because of the sedation medicines used during the test).    FOLLOW UP: Our staff will call the number listed on your records 48-72 hours following your procedure to check on you and address any questions or concerns that you may have regarding the information given to you following your procedure. If we do not reach you, we will leave a message.  We will attempt to reach you two times.  During this call, we will ask if you have developed any symptoms of COVID 19. If you develop any symptoms (ie: fever, flu-like symptoms, shortness of breath, cough etc.) before then, please call [phone].  If you test positive for Covid 19 in the 2 weeks post procedure, please call and report this information to [place].    If any biopsies were taken you will be contacted by phone or by letter within the next 1-3 weeks.  Please call us at [phone] if you have not heard about the biopsies in 3 weeks.    SIGNATURES/CONFIDENTIALITY: You and/or your care partner have signed paperwork which will be entered into your electronic medical record.  These signatures attest to the fact that that the information above on your After Visit Summary has been reviewed and is understood.  Full  responsibility of the confidentiality of this discharge information lies with you and/or your care-partner.

## 2021-06-28 ENCOUNTER — Telehealth: Payer: Self-pay

## 2021-06-28 NOTE — Telephone Encounter (Signed)
Attempted f/u call back. No answer, left VM. 

## 2021-06-28 NOTE — Telephone Encounter (Signed)
Patient returned call, stated she was doing great.

## 2021-07-05 ENCOUNTER — Encounter: Payer: Self-pay | Admitting: Internal Medicine

## 2021-07-28 DIAGNOSIS — R519 Headache, unspecified: Secondary | ICD-10-CM | POA: Diagnosis not present

## 2021-07-30 DIAGNOSIS — I10 Essential (primary) hypertension: Secondary | ICD-10-CM | POA: Diagnosis not present

## 2021-07-31 DIAGNOSIS — I10 Essential (primary) hypertension: Secondary | ICD-10-CM | POA: Diagnosis not present

## 2021-08-03 DIAGNOSIS — R5383 Other fatigue: Secondary | ICD-10-CM | POA: Diagnosis not present

## 2021-08-03 DIAGNOSIS — R519 Headache, unspecified: Secondary | ICD-10-CM | POA: Diagnosis not present

## 2021-08-03 DIAGNOSIS — J3489 Other specified disorders of nose and nasal sinuses: Secondary | ICD-10-CM | POA: Diagnosis not present

## 2021-08-03 DIAGNOSIS — H6592 Unspecified nonsuppurative otitis media, left ear: Secondary | ICD-10-CM | POA: Diagnosis not present

## 2021-08-05 DIAGNOSIS — Z7729 Contact with and (suspected ) exposure to other hazardous substances: Secondary | ICD-10-CM | POA: Diagnosis not present

## 2021-08-05 DIAGNOSIS — I10 Essential (primary) hypertension: Secondary | ICD-10-CM | POA: Diagnosis not present

## 2021-09-13 DIAGNOSIS — Z23 Encounter for immunization: Secondary | ICD-10-CM | POA: Diagnosis not present

## 2021-10-12 ENCOUNTER — Other Ambulatory Visit: Payer: Self-pay | Admitting: Physician Assistant

## 2021-10-12 DIAGNOSIS — Z1231 Encounter for screening mammogram for malignant neoplasm of breast: Secondary | ICD-10-CM

## 2021-10-25 ENCOUNTER — Ambulatory Visit
Admission: RE | Admit: 2021-10-25 | Discharge: 2021-10-25 | Disposition: A | Discharge status: Home / Self Care | Payer: Medicare Other | Source: Ambulatory Visit | Attending: Physician Assistant | Admitting: Physician Assistant

## 2021-10-25 DIAGNOSIS — Z1231 Encounter for screening mammogram for malignant neoplasm of breast: Secondary | ICD-10-CM

## 2021-12-16 ENCOUNTER — Other Ambulatory Visit: Payer: Self-pay | Admitting: Family Medicine

## 2021-12-16 ENCOUNTER — Ambulatory Visit: Payer: Self-pay

## 2021-12-16 DIAGNOSIS — M79675 Pain in left toe(s): Secondary | ICD-10-CM

## 2022-01-30 ENCOUNTER — Encounter (HOSPITAL_COMMUNITY): Payer: Self-pay

## 2022-01-30 ENCOUNTER — Other Ambulatory Visit: Payer: Self-pay

## 2022-01-30 ENCOUNTER — Emergency Department (HOSPITAL_COMMUNITY): Payer: Medicare Other

## 2022-01-30 ENCOUNTER — Emergency Department (HOSPITAL_COMMUNITY)
Admission: EM | Admit: 2022-01-30 | Discharge: 2022-01-30 | Disposition: A | Discharge status: Home / Self Care | Payer: Medicare Other | Attending: Emergency Medicine | Admitting: Emergency Medicine

## 2022-01-30 DIAGNOSIS — J45909 Unspecified asthma, uncomplicated: Secondary | ICD-10-CM | POA: Insufficient documentation

## 2022-01-30 DIAGNOSIS — Z7951 Long term (current) use of inhaled steroids: Secondary | ICD-10-CM | POA: Diagnosis not present

## 2022-01-30 DIAGNOSIS — Z79899 Other long term (current) drug therapy: Secondary | ICD-10-CM | POA: Insufficient documentation

## 2022-01-30 DIAGNOSIS — R0781 Pleurodynia: Secondary | ICD-10-CM | POA: Diagnosis not present

## 2022-01-30 DIAGNOSIS — Z8616 Personal history of COVID-19: Secondary | ICD-10-CM | POA: Diagnosis not present

## 2022-01-30 DIAGNOSIS — R0789 Other chest pain: Secondary | ICD-10-CM

## 2022-01-30 DIAGNOSIS — R079 Chest pain, unspecified: Secondary | ICD-10-CM | POA: Diagnosis not present

## 2022-01-30 DIAGNOSIS — Z7982 Long term (current) use of aspirin: Secondary | ICD-10-CM | POA: Insufficient documentation

## 2022-01-30 DIAGNOSIS — I10 Essential (primary) hypertension: Secondary | ICD-10-CM | POA: Insufficient documentation

## 2022-01-30 LAB — CBC
HCT: 37.8 % (ref 36.0–46.0)
Hemoglobin: 12.3 g/dL (ref 12.0–15.0)
MCH: 29.4 pg (ref 26.0–34.0)
MCHC: 32.5 g/dL (ref 30.0–36.0)
MCV: 90.2 fL (ref 80.0–100.0)
Platelets: 249 10*3/uL (ref 150–400)
RBC: 4.19 MIL/uL (ref 3.87–5.11)
RDW: 13.4 % (ref 11.5–15.5)
WBC: 7.1 10*3/uL (ref 4.0–10.5)
nRBC: 0 % (ref 0.0–0.2)

## 2022-01-30 LAB — BASIC METABOLIC PANEL
Anion gap: 6 (ref 5–15)
BUN: 14 mg/dL (ref 8–23)
CO2: 25 mmol/L (ref 22–32)
Calcium: 9.3 mg/dL (ref 8.9–10.3)
Chloride: 109 mmol/L (ref 98–111)
Creatinine, Ser: 0.75 mg/dL (ref 0.44–1.00)
GFR, Estimated: >60 mL/min (ref >60)
Glucose, Bld: 109 mg/dL — ABNORMAL HIGH (ref 70–99)
Potassium: 3.8 mmol/L (ref 3.5–5.1)
Sodium: 140 mmol/L (ref 135–145)

## 2022-01-30 LAB — TROPONIN I (HIGH SENSITIVITY)
Troponin I (High Sensitivity): 3 ng/L (ref <18)
Troponin I (High Sensitivity): 4 ng/L (ref <18)

## 2022-01-30 MED ORDER — METHOCARBAMOL 500 MG PO TABS: 500.0000 mg | ORAL_TABLET | Freq: Two times a day (BID) | ORAL | 0 refills | Status: AC

## 2022-01-30 MED ORDER — METHYLPREDNISOLONE SODIUM SUCC 125 MG IJ SOLR
125.0000 mg | Freq: Once | INTRAMUSCULAR | Status: AC
  Administered 2022-01-30: 125 mg via INTRAVENOUS
  Filled 2022-01-30: qty 2

## 2022-01-30 MED ORDER — FAMOTIDINE IN NACL 20-0.9 MG/50ML-% IV SOLN
20.0000 mg | Freq: Once | INTRAVENOUS | Status: AC
  Administered 2022-01-30: 20 mg via INTRAVENOUS
  Filled 2022-01-30: qty 50

## 2022-01-30 MED ORDER — LIDOCAINE 5 % EX PTCH: 1.0000 | MEDICATED_PATCH | Freq: Every day | CUTANEOUS | 0 refills | Status: DC | PRN

## 2022-01-30 MED ORDER — IOHEXOL 350 MG/ML SOLN
75.0000 mL | Freq: Once | INTRAVENOUS | Status: AC | PRN
  Administered 2022-01-30: 75 mL via INTRAVENOUS

## 2022-01-30 MED ORDER — DIPHENHYDRAMINE HCL 50 MG/ML IJ SOLN
50.0000 mg | Freq: Once | INTRAMUSCULAR | Status: AC
  Administered 2022-01-30: 50 mg via INTRAVENOUS
  Filled 2022-01-30: qty 1

## 2022-01-30 NOTE — ED Triage Notes (Signed)
Patients chest pain began under her right breast at 5am. No radiation. No dizziness, sweatiness or nausea. Shortness of breath while sitting.

## 2022-01-30 NOTE — ED Provider Notes (Signed)
[facility] DEPT Provider Note   CSN: [id number] Arrival date & time: 01/30/22  9983     History {Add pertinent medical, surgical, social history, OB history to HPI:1} Chief Complaint  Patient presents with   Chest Pain    Emily Clarke is a 71 y.o. female.  Patient as above with significant medical history as below, including HLD, SVT,  GERD who presents to the ED with complaint of chest pain.  Patient with right-sided chest pain onset approximately 5 AM this morning.  Pain began spontaneously.  She noticed after she woke up.  Pain is described as pleuritic.  Pain worse with deep inspiration.  Not reproducible on palpation.  She has no significant dyspnea.  No palpitations.  Pain is sharp and stabbing.  Nonradiating.  No prior history of DVT or PE.  She had recent travel to [place] by car.  No significant lower extremity swelling.  No oral hormone use.  Pain is persisted since this morning, has not improved.     Past Medical History:  Diagnosis Date   Asthma    inhaler- uses PRN   Chronic back pain    COVID    GERD (gastroesophageal reflux disease)    off meds currently   Headache    Hyperlipidemia    does not take meds- does not "like the wasy they make me feel"   Hypertension    on meds   Rotator cuff syndrome of left shoulder    pt denies   Scoliosis    Seasonal allergies    SVT (supraventricular tachycardia) ([place])    Tachycardia     Past Surgical History:  Procedure Laterality Date   ABDOMINAL HYSTERECTOMY  1985   BREAST CYST ASPIRATION Left 2014   CHOLECYSTECTOMY  1986   COLONOSCOPY  2017   JMP-MAC-exc prep-polyps x 1   POLYPECTOMY  2017   TUBAL LIGATION  1983   WISDOM TOOTH EXTRACTION       The history is provided by the patient. No language interpreter was used.  Chest Pain Associated symptoms: no abdominal pain, no back pain, no cough, no dysphagia, no fever, no headache, no nausea, no palpitations and no shortness of  breath        Home Medications Prior to Admission medications   Medication Sig Start Date End Date Taking? Authorizing Provider  Ascorbic Acid (VITAMIN C) 500 MG CAPS Take 1 capsule by mouth daily at 6 (six) AM.    [provider]  aspirin EC 81 MG tablet Take 1 tablet (81 mg total) by mouth daily. 08/23/15   Pattricia Boss, MD  Blood Glucose Monitoring Suppl (ONETOUCH VERIO) w/Device KIT See admin instructions. 09/25/19   [provider]  calcium carbonate (OSCAL) 1500 (600 Ca) MG TABS tablet Take 1 tablet by mouth daily at 6 (six) AM.    [provider]  fluticasone (FLONASE) 50 MCG/ACT nasal spray Place 1 spray into both nostrils daily as needed.    [provider]  glucose blood (ONETOUCH VERIO) test strip Use to check your blood sugar 09/08/20   [provider]  Lancets (ONETOUCH DELICA PLUS [id number]) [place]  05/20/21   [provider]  Lancets Glory Rosebush ULTRASOFT) lancets See admin instructions. 09/25/19   [provider]  metoprolol succinate (TOPROL-XL) 25 MG 24 hr tablet Take 1 tablet (25 mg total) by mouth daily. 08/23/15   Pattricia Boss, MD  Omega-3 Fatty Acids (FISH OIL) 1000 MG CAPS Take 1 capsule by  mouth daily.    [provider]  [facility] VERIO test strip 1 each daily. 06/07/21   [provider]  [place] 403 (90 Base) MCG/ACT AEPB Inhale into the lungs. 03/31/21   [provider]      Allergies    Amlodipine besylate, Atorvastatin, Cyclobenzaprine, and Hydrochlorothiazide    Review of Systems   Review of Systems  Constitutional:  Negative for activity change and fever.  HENT:  Negative for facial swelling and trouble swallowing.   Eyes:  Negative for discharge and redness.  Respiratory:  Negative for cough and shortness of breath.   Cardiovascular:  Positive for chest pain. Negative for palpitations.  Gastrointestinal:  Negative for abdominal pain and nausea.  Genitourinary:   Negative for dysuria and flank pain.  Musculoskeletal:  Negative for back pain and gait problem.  Skin:  Negative for pallor and rash.  Neurological:  Negative for syncope and headaches.    Physical Exam Updated Vital Signs BP (!) 145/90 (BP Location: Left Arm)   Pulse 76   Temp 97.9 F (36.6 C)   Resp 18   Ht _0  (1.626 m)   Wt 70.2 kg   SpO2 97%   BMI 26.57 kg/m  Physical Exam Vitals and nursing note reviewed.  Constitutional:      General: She is not in acute distress.    Appearance: Normal appearance. She is well-developed. She is obese. She is not ill-appearing, toxic-appearing or diaphoretic.  HENT:     Head: Normocephalic and atraumatic.     Right Ear: External ear normal.     Left Ear: External ear normal.     Nose: Nose normal.     Mouth/Throat:     Mouth: Mucous membranes are moist.  Eyes:     General: No scleral icterus.       Right eye: No discharge.        Left eye: No discharge.  Cardiovascular:     Rate and Rhythm: Normal rate and regular rhythm.     Pulses: Normal pulses.     Heart sounds: Normal heart sounds.  Pulmonary:     Effort: Pulmonary effort is normal. No tachypnea, accessory muscle usage or respiratory distress.     Breath sounds: Normal breath sounds. No decreased breath sounds or wheezing.  Abdominal:     General: Abdomen is flat.     Palpations: Abdomen is soft.     Tenderness: There is no abdominal tenderness. There is no guarding or rebound.  Musculoskeletal:        General: Normal range of motion.     Cervical back: Normal range of motion.     Right lower leg: No edema.     Left lower leg: No edema.  Skin:    General: Skin is warm and dry.     Capillary Refill: Capillary refill takes less than 2 seconds.  Neurological:     Mental Status: She is alert and oriented to person, place, and time.     GCS: GCS eye subscore is 4. GCS verbal subscore is 5. GCS motor subscore is 6.  Psychiatric:        Mood and Affect: Mood normal.         Behavior: Behavior normal.     ED Results / Procedures / Treatments   Labs (all labs ordered are listed, but only abnormal results are displayed) Labs Reviewed  BASIC METABOLIC PANEL - Abnormal; Notable for the following components:  Result Value   Glucose, Bld 109 (*)    All other components within normal limits  CBC  TROPONIN I (HIGH SENSITIVITY)  TROPONIN I (HIGH SENSITIVITY)    EKG EKG Interpretation  Date/Time:  Monday January 30 2022 06:45:38 EDT Ventricular Rate:  65 PR Interval:  203 QRS Duration: 90 QT Interval:  392 QTC Calculation: 408 R Axis:   61 Text Interpretation: Sinus rhythm no stemi similar to prior Confirmed by Wynona Dove ([id number]) on 01/30/2022 6:23:33 PM  Radiology DG Chest 2 View  Result Date: 01/30/2022 CLINICAL DATA:  Chest pain EXAM: CHEST - 2 VIEW COMPARISON:  10/17/2020 FINDINGS: Normal heart size and mediastinal contours. No acute infiltrate or edema. No effusion or pneumothorax. No acute osseous findings. IMPRESSION: No active cardiopulmonary disease. Electronically Signed   By: Jorje Guild M.D.   On: 01/30/2022 07:08    Procedures Procedures  {Document cardiac monitor, telemetry assessment procedure when appropriate:1}  Medications Ordered in ED Medications - No data to display  ED Course/ Medical Decision Making/ A&P                           Medical Decision Making Amount and/or Complexity of Data Reviewed Labs: ordered. Radiology: ordered.    CC: cp  This patient presents to the Emergency Department for the above complaint. This involves an extensive number of treatment options and is a complaint that carries with it a high risk of complications and morbidity. Vital signs were reviewed. Serious etiologies considered.  Differential includes all life-threatening causes for chest pain. This includes but is not exclusive to acute coronary syndrome, aortic dissection, pulmonary embolism, cardiac tamponade, community-acquired  pneumonia, pericarditis, musculoskeletal chest wall pain, etc.  Record review:  Previous records obtained and reviewed prior office/ed visits, prior labs/imaging   Additional history obtained from ***  Medical and surgical history as noted above.   Work up as above, notable for:  Labs & imaging results that were available during my care of the patient were visualized by me and considered in my medical decision making.  Physical exam as above.   I ordered imaging studies which included ***. I visualized the imaging, interpreted images, and I agree with radiologist interpretation. ***  Cardiac monitoring reviewed and interpreted personally which shows ***   Personally discussed patient care with consultant; ***  Management: ***  ED Course:     Reassessment:  ***  Admission was considered.                Social determinants of health include -  Social History   Socioeconomic History   Marital status: Single    Spouse name: Not on file   Number of children: 3   Years of education: Not on file   Highest education level: Not on file  Occupational History    Employer: [employer]  Tobacco Use   Smoking status: Never   Smokeless tobacco: Never  Vaping Use   Vaping Use: Never used  Substance and Sexual Activity   Alcohol use: No   Drug use: No   Sexual activity: Yes    Birth control/protection: Surgical  Other Topics Concern   Not on file  Social History Narrative   Not on file   Social Determinants of Health   Financial Resource Strain: Not on file  Food Insecurity: Not on file  Transportation Needs: Not on file  Physical Activity: Not on file  Stress: Not on file  Social  Connections: Not on file  Intimate Partner Violence: Not on file      This chart was dictated using voice recognition software.  Despite best efforts to proofread,  errors can occur which can change the documentation meaning.   {Document critical care time when  appropriate:1} {Document review of labs and clinical decision tools ie heart score, Chads2Vasc2 etc:1}  {Document your independent review of radiology images, and any outside records:1} {Document your discussion with family members, caretakers, and with consultants:1} {Document social determinants of health affecting pt's care:1} {Document your decision making why or why not admission, treatments were needed:1} Final Clinical Impression(s) / ED Diagnoses Final diagnoses:  Atypical chest pain    Rx / DC Orders ED Discharge Orders     None

## 2022-01-30 NOTE — ED Notes (Signed)
20GA IV inserted in Left AC for CT scan

## 2022-01-30 NOTE — Discharge Instructions (Signed)
It was a pleasure caring for you today in the emergency department. ° °Please return to the emergency department for any worsening or worrisome symptoms. ° ° °

## 2022-03-07 DIAGNOSIS — R079 Chest pain, unspecified: Secondary | ICD-10-CM | POA: Diagnosis not present

## 2022-03-07 DIAGNOSIS — I10 Essential (primary) hypertension: Secondary | ICD-10-CM | POA: Diagnosis not present

## 2022-04-06 DIAGNOSIS — I2510 Atherosclerotic heart disease of native coronary artery without angina pectoris: Secondary | ICD-10-CM | POA: Diagnosis not present

## 2022-04-06 DIAGNOSIS — Z0000 Encounter for general adult medical examination without abnormal findings: Secondary | ICD-10-CM | POA: Diagnosis not present

## 2022-04-06 DIAGNOSIS — R7303 Prediabetes: Secondary | ICD-10-CM | POA: Diagnosis not present

## 2022-04-06 DIAGNOSIS — J302 Other seasonal allergic rhinitis: Secondary | ICD-10-CM | POA: Diagnosis not present

## 2022-04-06 DIAGNOSIS — E785 Hyperlipidemia, unspecified: Secondary | ICD-10-CM | POA: Diagnosis not present

## 2022-04-06 DIAGNOSIS — I10 Essential (primary) hypertension: Secondary | ICD-10-CM | POA: Diagnosis not present

## 2022-04-06 DIAGNOSIS — Z23 Encounter for immunization: Secondary | ICD-10-CM | POA: Diagnosis not present

## 2022-04-07 ENCOUNTER — Other Ambulatory Visit: Payer: Self-pay | Admitting: Physician Assistant

## 2022-04-07 DIAGNOSIS — I2510 Atherosclerotic heart disease of native coronary artery without angina pectoris: Secondary | ICD-10-CM

## 2022-04-10 DIAGNOSIS — H1013 Acute atopic conjunctivitis, bilateral: Secondary | ICD-10-CM | POA: Diagnosis not present

## 2022-04-10 DIAGNOSIS — J3089 Other allergic rhinitis: Secondary | ICD-10-CM | POA: Diagnosis not present

## 2022-04-24 ENCOUNTER — Encounter (HOSPITAL_COMMUNITY): Payer: Self-pay | Admitting: Emergency Medicine

## 2022-04-24 ENCOUNTER — Emergency Department (HOSPITAL_COMMUNITY): Payer: Medicare Other

## 2022-04-24 ENCOUNTER — Emergency Department (HOSPITAL_COMMUNITY)
Admission: EM | Admit: 2022-04-24 | Discharge: 2022-04-24 | Disposition: A | Discharge status: Home / Self Care | Payer: Medicare Other | Attending: Emergency Medicine | Admitting: Emergency Medicine

## 2022-04-24 DIAGNOSIS — Z743 Need for continuous supervision: Secondary | ICD-10-CM | POA: Diagnosis not present

## 2022-04-24 DIAGNOSIS — J45909 Unspecified asthma, uncomplicated: Secondary | ICD-10-CM | POA: Insufficient documentation

## 2022-04-24 DIAGNOSIS — R0602 Shortness of breath: Secondary | ICD-10-CM | POA: Insufficient documentation

## 2022-04-24 DIAGNOSIS — Z7982 Long term (current) use of aspirin: Secondary | ICD-10-CM | POA: Insufficient documentation

## 2022-04-24 DIAGNOSIS — R0789 Other chest pain: Secondary | ICD-10-CM | POA: Diagnosis not present

## 2022-04-24 DIAGNOSIS — R0600 Dyspnea, unspecified: Secondary | ICD-10-CM | POA: Diagnosis not present

## 2022-04-24 DIAGNOSIS — R079 Chest pain, unspecified: Secondary | ICD-10-CM | POA: Diagnosis not present

## 2022-04-24 DIAGNOSIS — I499 Cardiac arrhythmia, unspecified: Secondary | ICD-10-CM | POA: Diagnosis not present

## 2022-04-24 DIAGNOSIS — R6889 Other general symptoms and signs: Secondary | ICD-10-CM | POA: Diagnosis not present

## 2022-04-24 LAB — D-DIMER, QUANTITATIVE: D-Dimer, Quant: 0.65 ug/mL-FEU — ABNORMAL HIGH (ref 0.00–0.50)

## 2022-04-24 LAB — CBC WITH DIFFERENTIAL/PLATELET
Abs Immature Granulocytes: 0.03 10*3/uL (ref 0.00–0.07)
Basophils Absolute: 0.1 10*3/uL (ref 0.0–0.1)
Basophils Relative: 1 %
Eosinophils Absolute: 0.2 10*3/uL (ref 0.0–0.5)
Eosinophils Relative: 2 %
HCT: 39.8 % (ref 36.0–46.0)
Hemoglobin: 12.9 g/dL (ref 12.0–15.0)
Immature Granulocytes: 0 %
Lymphocytes Relative: 32 %
Lymphs Abs: 3.2 10*3/uL (ref 0.7–4.0)
MCH: 29.9 pg (ref 26.0–34.0)
MCHC: 32.4 g/dL (ref 30.0–36.0)
MCV: 92.1 fL (ref 80.0–100.0)
Monocytes Absolute: 0.8 10*3/uL (ref 0.1–1.0)
Monocytes Relative: 8 %
Neutro Abs: 6 10*3/uL (ref 1.7–7.7)
Neutrophils Relative %: 57 %
Platelets: 248 10*3/uL (ref 150–400)
RBC: 4.32 MIL/uL (ref 3.87–5.11)
RDW: 12.8 % (ref 11.5–15.5)
WBC: 10.3 10*3/uL (ref 4.0–10.5)
nRBC: 0 % (ref 0.0–0.2)

## 2022-04-24 LAB — COMPREHENSIVE METABOLIC PANEL
ALT: 21 U/L (ref 0–44)
AST: 23 U/L (ref 15–41)
Albumin: 3.7 g/dL (ref 3.5–5.0)
Alkaline Phosphatase: 118 U/L (ref 38–126)
Anion gap: 9 (ref 5–15)
BUN: 16 mg/dL (ref 8–23)
CO2: 27 mmol/L (ref 22–32)
Calcium: 9.2 mg/dL (ref 8.9–10.3)
Chloride: 105 mmol/L (ref 98–111)
Creatinine, Ser: 0.74 mg/dL (ref 0.44–1.00)
GFR, Estimated: >60 mL/min (ref >60)
Glucose, Bld: 110 mg/dL — ABNORMAL HIGH (ref 70–99)
Potassium: 3 mmol/L — ABNORMAL LOW (ref 3.5–5.1)
Sodium: 141 mmol/L (ref 135–145)
Total Bilirubin: 0.5 mg/dL (ref 0.3–1.2)
Total Protein: 7.6 g/dL (ref 6.5–8.1)

## 2022-04-24 LAB — TROPONIN I (HIGH SENSITIVITY)
Troponin I (High Sensitivity): 5 ng/L (ref <18)
Troponin I (High Sensitivity): 7 ng/L (ref <18)

## 2022-04-24 MED ORDER — IBUPROFEN 200 MG PO TABS
400.0000 mg | ORAL_TABLET | Freq: Once | ORAL | Status: AC
  Administered 2022-04-24: 400 mg via ORAL
  Filled 2022-04-24: qty 2

## 2022-04-24 MED ORDER — OXYCODONE-ACETAMINOPHEN 5-325 MG PO TABS
1.0000 | ORAL_TABLET | Freq: Once | ORAL | Status: AC
  Administered 2022-04-24: 1 via ORAL
  Filled 2022-04-24: qty 1

## 2022-04-24 MED ORDER — METHOCARBAMOL 500 MG PO TABS: 500.0000 mg | ORAL_TABLET | Freq: Two times a day (BID) | ORAL | 0 refills | Status: DC | PRN

## 2022-04-24 NOTE — ED Notes (Signed)
Pt transported to XR.  

## 2022-04-24 NOTE — ED Provider Notes (Signed)
[facility] DEPT Provider Note   CSN: [id number] Arrival date & time: 04/24/22  0224     History  Chief Complaint  Patient presents with   Shortness of Breath    Emily Clarke is a 71 y.o. female.  Patient presents to the emergency department for evaluation of chest pain and shortness of breath.  Patient reports that she had sudden onset of sharp pain in the right chest around 10 PM.  Patient reports shortness of breath, pain worsens when she tries to take a deep breath.  She does have a history of asthma.  Patient administered albuterol and Atrovent during transport.       Home Medications Prior to Admission medications   Medication Sig Start Date End Date Taking? Authorizing Provider  methocarbamol (ROBAXIN) 500 MG tablet Take 1 tablet (500 mg total) by mouth 2 (two) times daily as needed for muscle spasms. 04/24/22  Yes Oney Folz, Gwenyth Allegra, MD  Ascorbic Acid (VITAMIN C) 500 MG CAPS Take 1 capsule by mouth daily at 6 (six) AM.    [provider]  aspirin EC 81 MG tablet Take 1 tablet (81 mg total) by mouth daily. 08/23/15   Pattricia Boss, MD  Blood Glucose Monitoring Suppl (ONETOUCH VERIO) w/Device KIT See admin instructions. 09/25/19   [provider]  calcium carbonate (OSCAL) 1500 (600 Ca) MG TABS tablet Take 1 tablet by mouth daily at 6 (six) AM.    [provider]  fluticasone (FLONASE) 50 MCG/ACT nasal spray Place 1 spray into both nostrils daily as needed.    [provider]  glucose blood (ONETOUCH VERIO) test strip Use to check your blood sugar 09/08/20   [provider]  Lancets (ONETOUCH DELICA PLUS [id number]) [place]  05/20/21   [provider]  Lancets Glory Rosebush ULTRASOFT) lancets See admin instructions. 09/25/19   [provider]  lidocaine (LIDODERM) 5 % Place 1 patch onto the skin daily as needed. Remove & Discard patch within 12 hours or as directed by MD 01/30/22   Wynona Dove A, DO  metoprolol succinate (TOPROL-XL) 25 MG 24 hr tablet Take 1 tablet (25 mg total) by mouth daily. 08/23/15   Pattricia Boss, MD  olmesartan (BENICAR) 20 MG tablet Take 20 mg by mouth daily. 03/14/22   [provider]  Omega-3 Fatty Acids (FISH OIL) 1000 MG CAPS Take 1 capsule by mouth daily.    [provider]  [facility] VERIO test strip 1 each daily. 06/07/21   [provider]  [place] 381 (90 Base) MCG/ACT AEPB Inhale 1 puff into the lungs daily. 03/31/21   [provider]      Allergies    Iodine, Amlodipine besylate, Atorvastatin, Cyclobenzaprine, and Hydrochlorothiazide    Review of Systems   Review of Systems  Physical Exam Updated Vital Signs BP 133/84   Pulse 74   Temp 97.9 F (36.6 C)   Resp (!) 23   Ht '5\' 4"'  (1.626 m)   Wt 70.2 kg   SpO2 96%   BMI 26.57 kg/m  Physical Exam Vitals and nursing note reviewed.  Constitutional:      General: She is not in acute distress.    Appearance: She is well-developed.  HENT:     Head: Normocephalic and atraumatic.     Mouth/Throat:     Mouth: Mucous membranes are moist.  Eyes:     General: Vision grossly intact. Gaze aligned appropriately.     Extraocular Movements: Extraocular  movements intact.     Conjunctiva/sclera: Conjunctivae normal.  Cardiovascular:     Rate and Rhythm: Normal rate and regular rhythm.     Pulses: Normal pulses.     Heart sounds: Normal heart sounds, S1 normal and S2 normal. No murmur heard.    No friction rub. No gallop.  Pulmonary:     Effort: Pulmonary effort is normal. No respiratory distress.     Breath sounds: Normal breath sounds.  Abdominal:     General: Bowel sounds are normal.     Palpations: Abdomen is soft.     Tenderness: There is no abdominal tenderness. There is no guarding or rebound.     Hernia: No hernia is present.  Musculoskeletal:        General: No swelling.     Cervical back: Full passive range of motion without pain,  normal range of motion and neck supple. No spinous process tenderness or muscular tenderness. Normal range of motion.     Right lower leg: No edema.     Left lower leg: No edema.  Skin:    General: Skin is warm and dry.     Capillary Refill: Capillary refill takes less than 2 seconds.     Findings: No ecchymosis, erythema, rash or wound.  Neurological:     General: No focal deficit present.     Mental Status: She is alert and oriented to person, place, and time.     GCS: GCS eye subscore is 4. GCS verbal subscore is 5. GCS motor subscore is 6.     Cranial Nerves: Cranial nerves 2-12 are intact.     Sensory: Sensation is intact.     Motor: Motor function is intact.     Coordination: Coordination is intact.  Psychiatric:        Attention and Perception: Attention normal.        Mood and Affect: Mood normal.        Speech: Speech normal.        Behavior: Behavior normal.     ED Results / Procedures / Treatments   Labs (all labs ordered are listed, but only abnormal results are displayed) Labs Reviewed  COMPREHENSIVE METABOLIC PANEL - Abnormal; Notable for the following components:      Result Value   Potassium 3.0 (*)    Glucose, Bld 110 (*)    All other components within normal limits  D-DIMER, QUANTITATIVE - Abnormal; Notable for the following components:   D-Dimer, Quant 0.65 (*)    All other components within normal limits  CBC WITH DIFFERENTIAL/PLATELET  TROPONIN I (HIGH SENSITIVITY)  TROPONIN I (HIGH SENSITIVITY)    EKG EKG Interpretation  Date/Time:  Monday April 24 2022 02:33:27 EDT Ventricular Rate:  78 PR Interval:  195 QRS Duration: 91 QT Interval:  385 QTC Calculation: 439 R Axis:   20 Text Interpretation: Sinus rhythm LVH by voltage Confirmed by Orpah Greek [phone]) on 04/24/2022 2:36:10 AM  Radiology DG Chest 2 View  Result Date: 04/24/2022 CLINICAL DATA:  Right chest pain, dyspnea EXAM: CHEST - 2 VIEW COMPARISON:  01/30/2022 FINDINGS:  Bibasilar atelectasis or infiltrate is developed, left greater than right. Lung volumes are small. No pneumothorax or pleural effusion. Cardiac size within normal limits. Pulmonary vascularity is normal. No acute bone abnormality. IMPRESSION: Bibasilar atelectasis or infiltrate, left greater than right. Electronically Signed   By: Fidela Salisbury M.D.   On: 04/24/2022 02:46    Procedures Procedures    Medications Ordered in  ED Medications  oxyCODONE-acetaminophen (PERCOCET/ROXICET) 5-325 MG per tablet 1 tablet (1 tablet Oral Given 04/24/22 0637)  ibuprofen (ADVIL) tablet 400 mg (400 mg Oral Given 04/24/22 2831)    ED Course/ Medical Decision Making/ A&P                           Medical Decision Making Amount and/or Complexity of Data Reviewed Labs: ordered. Radiology: ordered.  Risk OTC drugs. Prescription drug management.   Patient presents to the emergency department for evaluation of somewhat sudden onset right rib and chest pain.  Patient reports shortness of breath.  He is having trouble breathing because she cannot take a deep breath because it hurts.  Reviewing her records reveals that this is a third visit for similar symptoms.  She has had PE work-ups including CT angiography at each of her prior visits without any pulmonary abnormalities.  Unstable angina/ACS, pleurisy, pneumonia, PE all considered.  Patient's cardiac evaluation has been unremarkable.  EKG without changes.  Troponin negative x2.  Patient's D-dimer is normal with age adjustment.  As she recently had an ER visit with identical, sudden onset right chest pain, I do not feel she requires repeat PE work-up.  Pain is secondary to chest wall pain most likely.  Treated with analgesia with improvement.  Patient needs to follow-up with primary care.        Final Clinical Impression(s) / ED Diagnoses Final diagnoses:  Chest wall pain    Rx / DC Orders ED Discharge Orders          Ordered    methocarbamol  (ROBAXIN) 500 MG tablet  2 times daily PRN        04/24/22 0658              Orpah Greek, MD 04/24/22 [phone]

## 2022-04-24 NOTE — ED Triage Notes (Signed)
Pt BIB [facility] from home. Reports chest pain under R breast amd SOB suddent onset at 2200. EMS reports decreased lung sounds on the R side. Hx of asthma. Received 5 of albuterol and 0.5 atrovent en route.

## 2022-04-25 DIAGNOSIS — R0789 Other chest pain: Secondary | ICD-10-CM | POA: Diagnosis not present

## 2022-04-25 DIAGNOSIS — J302 Other seasonal allergic rhinitis: Secondary | ICD-10-CM | POA: Diagnosis not present

## 2022-05-01 ENCOUNTER — Telehealth: Payer: Self-pay | Admitting: *Deleted

## 2022-05-01 NOTE — Telephone Encounter (Signed)
     Patient  visit on 04/24/2022  at [facility] was for Chest pain  Have you been able to follow up with your primary care physician Patient has not had any more pain , saw Pcp already .Finished prednisone she believes helped   The patient was able to obtain any needed medicine or equipment.  Are there diet recommendations that you are having difficulty following?  Patient expresses understanding of discharge instructions and education provided has no other needs at this time.   [facility] [phone] [address] , [place] [address] Email : [email or web address]

## 2022-06-08 ENCOUNTER — Other Ambulatory Visit: Payer: Medicare Other

## 2022-06-30 DIAGNOSIS — Z03818 Encounter for observation for suspected exposure to other biological agents ruled out: Secondary | ICD-10-CM | POA: Diagnosis not present

## 2022-06-30 DIAGNOSIS — B349 Viral infection, unspecified: Secondary | ICD-10-CM | POA: Diagnosis not present

## 2022-07-20 ENCOUNTER — Other Ambulatory Visit: Payer: Medicare Other

## 2022-07-20 ENCOUNTER — Ambulatory Visit
Admission: RE | Admit: 2022-07-20 | Discharge: 2022-07-20 | Disposition: A | Discharge status: Home / Self Care | Payer: Medicare Other | Source: Ambulatory Visit | Attending: Physician Assistant | Admitting: Physician Assistant

## 2022-07-20 DIAGNOSIS — I2510 Atherosclerotic heart disease of native coronary artery without angina pectoris: Secondary | ICD-10-CM

## 2022-08-01 DIAGNOSIS — E7800 Pure hypercholesterolemia, unspecified: Secondary | ICD-10-CM | POA: Diagnosis not present

## 2022-08-01 DIAGNOSIS — I2510 Atherosclerotic heart disease of native coronary artery without angina pectoris: Secondary | ICD-10-CM | POA: Diagnosis not present

## 2022-08-18 DIAGNOSIS — Z03818 Encounter for observation for suspected exposure to other biological agents ruled out: Secondary | ICD-10-CM | POA: Diagnosis not present

## 2022-08-18 DIAGNOSIS — R051 Acute cough: Secondary | ICD-10-CM | POA: Diagnosis not present

## 2022-08-18 DIAGNOSIS — R52 Pain, unspecified: Secondary | ICD-10-CM | POA: Diagnosis not present

## 2022-08-18 DIAGNOSIS — R5383 Other fatigue: Secondary | ICD-10-CM | POA: Diagnosis not present

## 2022-09-05 DIAGNOSIS — R899 Unspecified abnormal finding in specimens from other organs, systems and tissues: Secondary | ICD-10-CM | POA: Diagnosis not present

## 2022-09-28 DIAGNOSIS — H25813 Combined forms of age-related cataract, bilateral: Secondary | ICD-10-CM | POA: Diagnosis not present

## 2022-09-28 DIAGNOSIS — H3581 Retinal edema: Secondary | ICD-10-CM | POA: Diagnosis not present

## 2022-09-28 DIAGNOSIS — H43811 Vitreous degeneration, right eye: Secondary | ICD-10-CM | POA: Diagnosis not present

## 2022-09-28 DIAGNOSIS — H524 Presbyopia: Secondary | ICD-10-CM | POA: Diagnosis not present

## 2022-11-02 DIAGNOSIS — E1169 Type 2 diabetes mellitus with other specified complication: Secondary | ICD-10-CM | POA: Diagnosis not present

## 2022-11-02 DIAGNOSIS — R0789 Other chest pain: Secondary | ICD-10-CM | POA: Diagnosis not present

## 2022-11-02 DIAGNOSIS — E119 Type 2 diabetes mellitus without complications: Secondary | ICD-10-CM | POA: Diagnosis not present

## 2022-11-02 DIAGNOSIS — I10 Essential (primary) hypertension: Secondary | ICD-10-CM | POA: Diagnosis not present

## 2022-11-02 DIAGNOSIS — E785 Hyperlipidemia, unspecified: Secondary | ICD-10-CM | POA: Diagnosis not present

## 2022-11-23 ENCOUNTER — Encounter: Payer: Self-pay | Admitting: Cardiology

## 2022-11-23 ENCOUNTER — Ambulatory Visit: Payer: Medicare Other | Admitting: Cardiology

## 2022-11-23 VITALS — BP 131/86 | HR 72 | Ht 64.0 in | Wt 146.0 lb

## 2022-11-23 DIAGNOSIS — R7303 Prediabetes: Secondary | ICD-10-CM | POA: Diagnosis not present

## 2022-11-23 DIAGNOSIS — R072 Precordial pain: Secondary | ICD-10-CM

## 2022-11-23 DIAGNOSIS — I2510 Atherosclerotic heart disease of native coronary artery without angina pectoris: Secondary | ICD-10-CM

## 2022-11-23 DIAGNOSIS — I2584 Coronary atherosclerosis due to calcified coronary lesion: Secondary | ICD-10-CM | POA: Diagnosis not present

## 2022-11-23 DIAGNOSIS — I10 Essential (primary) hypertension: Secondary | ICD-10-CM | POA: Diagnosis not present

## 2022-11-23 DIAGNOSIS — Z789 Other specified health status: Secondary | ICD-10-CM

## 2022-11-23 DIAGNOSIS — E7800 Pure hypercholesterolemia, unspecified: Secondary | ICD-10-CM | POA: Diagnosis not present

## 2022-11-23 MED ORDER — REPATHA SURECLICK 140 MG/ML ~~LOC~~ SOAJ: 140.0000 mg | SUBCUTANEOUS | 0 refills | Status: DC

## 2022-11-23 NOTE — Progress Notes (Signed)
ID:  TRENT CLEARE, DOB Mar 19, 1951, MRN [id number]  PCP:  Clementeen Graham, PA-C (Inactive)  Cardiologist:  [name], DO, [facility] (established care Nov 23, 2022)  REASON FOR CONSULT: Coronary artery calcification, chest pain, intolerant to statin  REQUESTING PHYSICIAN:  Ceasar Lund, PA [address] [place],  [place] [address]  Chief Complaint  Patient presents with   Chest discomfort   New Patient (Initial Visit)    HPI  Emily Clarke is a 72 y.o. African-American female who presents to the clinic for evaluation of coronary calcification, chest pain, intolerant to statin therapy, at the request of Turmel, Caleb P, PA. Her past medical history and cardiovascular risk factors include: Moderate CAC, hyperlipidemia, hypertension, GERD, asthma, prediabetic.   Patient is referred to the practice given her coronary calcification, prior episode of chest pain, and inability to tolerate statins.  In October 2023 she had an episode of precordial discomfort, left-sided, woke her up from sleep, worse with deep breath, not brought on by effort and did not resolve with rest.  EMS was called and patient was brought to the ED for further evaluation and management.  At the follow-up PCP visit coronary calcium score was performed for further risk stratification which notes moderate CAC.  She has been intolerant to statin therapy.  She has tried atorvastatin, simvastatin, rosuvastatin without much success.  Given the coronary artery calcification she was started on atorvastatin 20 mg once a week until today's visit.  Today she denies anginal chest pain or heart failure symptoms.  ALLERGIES: Allergies  Allergen Reactions   Iodine Hives   Amlodipine Besylate     Other reaction(s): Edema Other reaction(s): Edema   Atorvastatin     Other reaction(s): headaches Other reaction(s): headaches   Cyclobenzaprine     Other reaction(s): headaches Other reaction(s): headaches    Hydrochlorothiazide     Other reaction(s): Urinary frequency Other reaction(s): Urinary frequency    MEDICATION LIST PRIOR TO VISIT: Current Meds  Medication Sig   Ascorbic Acid (VITAMIN C) 500 MG CAPS Take 1 capsule by mouth daily at 6 (six) AM.   aspirin EC 81 MG tablet Take 1 tablet (81 mg total) by mouth daily.   atorvastatin (LIPITOR) 20 MG tablet Take 20 mg by mouth once a week. Wednesday   Blood Glucose Monitoring Suppl (ONETOUCH VERIO) w/Device KIT See admin instructions.   calcium carbonate (OSCAL) 1500 (600 Ca) MG TABS tablet Take 1 tablet by mouth daily at 6 (six) AM.   Evolocumab (REPATHA SURECLICK) 140 MG/ML SOAJ Inject 140 mg into the skin every 14 (fourteen) days for 6 doses.   fluticasone (FLONASE) 50 MCG/ACT nasal spray Place 1 spray into both nostrils daily as needed.   glucose blood (ONETOUCH VERIO) test strip Use to check your blood sugar   Lancets (ONETOUCH DELICA PLUS LANCET30G) MISC    Lancets (ONETOUCH ULTRASOFT) lancets See admin instructions.   metoprolol succinate (TOPROL-XL) 25 MG 24 hr tablet Take 1 tablet (25 mg total) by mouth daily.   Omega-3 Fatty Acids (FISH OIL) 1000 MG CAPS Take 1 capsule by mouth daily.   ONETOUCH VERIO test strip 1 each daily.   PROAIR RESPICLICK 108 (90 Base) MCG/ACT AEPB Inhale 1 puff into the lungs daily.   Current Facility-Administered Medications for the 11/23/22 encounter (Office Visit) with [name], DO  Medication   0.9 %  sodium chloride infusion   0.9 %  sodium chloride infusion     PAST MEDICAL  HISTORY: Past Medical History:  Diagnosis Date   Asthma    inhaler- uses PRN   Chronic back pain    COVID    GERD (gastroesophageal reflux disease)    off meds currently   Headache    Hyperlipidemia    does not take meds- does not "like the wasy they make me feel"   Hypertension    on meds   Rotator cuff syndrome of left shoulder    pt denies   Scoliosis    Seasonal allergies    SVT (supraventricular  tachycardia)    Tachycardia     PAST SURGICAL HISTORY: Past Surgical History:  Procedure Laterality Date   ABDOMINAL HYSTERECTOMY  1985   BREAST CYST ASPIRATION Left 2014   CHOLECYSTECTOMY  1986   COLONOSCOPY  2017   JMP-MAC-exc prep-polyps x 1   POLYPECTOMY  2017   TUBAL LIGATION  1983   WISDOM TOOTH EXTRACTION      FAMILY HISTORY: The patient family history includes Angina in her mother; Breast cancer (age of onset: 41) in her sister; Cancer - Lung in her mother; Colon polyps in her father; Fibromyalgia in her sister; Healthy in her brother; Heart attack in her father and mother; Heart disease in her father and mother; Hyperlipidemia in her father and mother; Prostate cancer in her father.  SOCIAL HISTORY:  The patient  reports that she has never smoked. She has never used smokeless tobacco. She reports that she does not drink alcohol and does not use drugs.  REVIEW OF SYSTEMS: Review of Systems  Cardiovascular:  Negative for chest pain, claudication, dyspnea on exertion, irregular heartbeat, leg swelling, near-syncope, orthopnea, palpitations, paroxysmal nocturnal dyspnea and syncope.  Respiratory:  Negative for shortness of breath.   Hematologic/Lymphatic: Negative for bleeding problem.  Musculoskeletal:  Negative for muscle cramps and myalgias.  Neurological:  Negative for dizziness and light-headedness.    PHYSICAL EXAM:    11/23/2022    2:16 PM 04/24/2022    7:00 AM 04/24/2022    6:00 AM  Vitals with BMI  Height 5\' 4"     Weight 146 lbs    BMI 25.05    Systolic 131 126 295  Diastolic 86 75 84  Pulse 72 78 74    Physical Exam  Constitutional: No distress.  Age appropriate, hemodynamically stable.   Neck: No JVD present.  Cardiovascular: Normal rate, regular rhythm, S1 normal, S2 normal, intact distal pulses and normal pulses. Exam reveals no gallop, no S3 and no S4.  No murmur heard. Pulmonary/Chest: Effort normal and breath sounds normal. No stridor. She has  no wheezes. She has no rales.  Abdominal: Soft. Bowel sounds are normal. She exhibits no distension. There is no abdominal tenderness.  Musculoskeletal:        General: No edema.     Cervical back: Neck supple.  Neurological: She is alert and oriented to person, place, and time. She has intact cranial nerves (2-12).  Skin: Skin is warm and moist.     CARDIAC DATABASE: EKG: Nov 23, 2022: Normal sinus rhythm, 64 bpm, normal axis, without underlying ischemia or injury pattern.  Echocardiogram: No results found for this or any previous visit from the past 1095 days.    Stress Testing: No results found for this or any previous visit from the past 1095 days.   Heart Catheterization: None  CT Cardiac Scoring: July 20, 2022 Left Main: 0 LAD: 86.7 LCx: 63.8 RCA: 0 Total Agatston Score: 151 MESA database percentile: 22  LABORATORY DATA: External Labs: Collected: Nov 02, 2022 provided by PCP. Hemoglobin A1c 6.0. BUN 12, creatinine 0.69. Sodium 142, potassium 3.9, chloride 105, bicarb 30 AST 18, ALT 14, alkaline phosphatase 118. Total cholesterol 207, triglycerides 206, HDL 41, LDL calculated 129, non-HDL 166 Hemoglobin A1c 6.0  IMPRESSION:    ICD-10-CM   1. Precordial pain  R07.2 EKG 12-Lead    PCV ECHOCARDIOGRAM COMPLETE    PCV MYOCARDIAL PERFUSION WO LEXISCAN    2. Coronary atherosclerosis due to calcified coronary lesion  I25.10 Evolocumab (REPATHA SURECLICK) 140 MG/ML SOAJ   I25.84 PCV ECHOCARDIOGRAM COMPLETE    PCV MYOCARDIAL PERFUSION WO LEXISCAN    3. Pure hypercholesterolemia  E78.00 Evolocumab (REPATHA SURECLICK) 140 MG/ML SOAJ    Lipid Panel With LDL/HDL Ratio    LDL cholesterol, direct    CMP14+EGFR    4. Statin intolerance  Z78.9 Evolocumab (REPATHA SURECLICK) 140 MG/ML SOAJ    5. Benign hypertension  I10     6. Prediabetes  R73.03        RECOMMENDATIONS: Emily Clarke is a 72 y.o. African-American female whose past medical history and cardiac  risk factors include: Moderate CAC, hyperlipidemia, hypertension, GERD, asthma, prediabetic.   Precordial pain Coronary atherosclerosis due to calcified coronary lesion Total CAC 151, 83rd percentile Last precordial discomfort in October 2023 which led to the ED visit. No active chest pain. EKG: Sinus rhythm without ischemia/injury pattern Echo will be ordered to evaluate for structural heart disease and left ventricular systolic function. Exercise nuclear stress test to evaluate for functional capacity and reversible ischemia Continue aspirin Continue atorvastatin 20 mg p.o. q. q. Weekly -on maximally tolerated dose of statin Shared decision was to proceed with Repatha  Pure hypercholesterolemia Statin intolerance Has been intolerant to atorvastatin, simvastatin, rosuvastatin Given her CAC she was started on atorvastatin low-dose on a weekly basis Discussed nonstatin medications as well as PCSK9 inhibitors. Shared decision was to proceed with PCSK9 inhibitors.  Side effect profile for Repatha reviewed. Will check lipids prior to next office visit once she has started Repatha.  Benign hypertension Office blood pressures within acceptable limits. No changes warranted. Currently managed by primary care provider.  Prediabetes Reemphasized importance of glycemic control.  Data Reviewed: I have independently reviewed external notes provided by the referring provider as part of this office visit.   I have independently reviewed results of EKG, labs as part of medical decision making. I have ordered the following tests:  Orders Placed This Encounter  Procedures   Lipid Panel With LDL/HDL Ratio    Standing Status:   Future    Standing Expiration Date:   11/23/2023   LDL cholesterol, direct    Standing Status:   Future    Standing Expiration Date:   11/23/2023   CMP14+EGFR    Standing Status:   Future    Standing Expiration Date:   11/23/2023   PCV MYOCARDIAL PERFUSION WO LEXISCAN     Standing Status:   Future    Standing Expiration Date:   11/23/2023   EKG 12-Lead   PCV ECHOCARDIOGRAM COMPLETE    Standing Status:   Future    Standing Expiration Date:   11/23/2023   I have made medications changes at today's encounter as noted above.  FINAL MEDICATION LIST END OF ENCOUNTER: Meds ordered this encounter  Medications   Evolocumab (REPATHA SURECLICK) 140 MG/ML SOAJ    Sig: Inject 140 mg into the skin every 14 (fourteen) days for 6 doses.  Dispense:  6 mL    Refill:  0    Medications Discontinued During This Encounter  Medication Reason   methocarbamol (ROBAXIN) 500 MG tablet    lidocaine (LIDODERM) 5 %    olmesartan (BENICAR) 20 MG tablet      Current Outpatient Medications:    Ascorbic Acid (VITAMIN C) 500 MG CAPS, Take 1 capsule by mouth daily at 6 (six) AM., Disp: , Rfl:    aspirin EC 81 MG tablet, Take 1 tablet (81 mg total) by mouth daily., Disp: 30 tablet, Rfl: 2   atorvastatin (LIPITOR) 20 MG tablet, Take 20 mg by mouth once a week. Wednesday, Disp: , Rfl:    Blood Glucose Monitoring Suppl (ONETOUCH VERIO) w/Device KIT, See admin instructions., Disp: , Rfl:    calcium carbonate (OSCAL) 1500 (600 Ca) MG TABS tablet, Take 1 tablet by mouth daily at 6 (six) AM., Disp: , Rfl:    Evolocumab (REPATHA SURECLICK) 140 MG/ML SOAJ, Inject 140 mg into the skin every 14 (fourteen) days for 6 doses., Disp: 6 mL, Rfl: 0   fluticasone (FLONASE) 50 MCG/ACT nasal spray, Place 1 spray into both nostrils daily as needed., Disp: , Rfl:    glucose blood (ONETOUCH VERIO) test strip, Use to check your blood sugar, Disp: , Rfl:    Lancets (ONETOUCH DELICA PLUS LANCET30G) MISC, , Disp: , Rfl:    Lancets (ONETOUCH ULTRASOFT) lancets, See admin instructions., Disp: , Rfl:    metoprolol succinate (TOPROL-XL) 25 MG 24 hr tablet, Take 1 tablet (25 mg total) by mouth daily., Disp: 90 tablet, Rfl: 0   Omega-3 Fatty Acids (FISH OIL) 1000 MG CAPS, Take 1 capsule by mouth daily., Disp: , Rfl:     ONETOUCH VERIO test strip, 1 each daily., Disp: , Rfl:    PROAIR RESPICLICK 108 (90 Base) MCG/ACT AEPB, Inhale 1 puff into the lungs daily., Disp: , Rfl:   Current Facility-Administered Medications:    0.9 %  sodium chloride infusion, 500 mL, Intravenous, Continuous, Pyrtle, Carie Caddy, MD   0.9 %  sodium chloride infusion, , Intravenous, PRN, Janetta Hora, PA-C  Orders Placed This Encounter  Procedures   Lipid Panel With LDL/HDL Ratio   LDL cholesterol, direct   [id number]   PCV MYOCARDIAL PERFUSION WO LEXISCAN   EKG 12-Lead   PCV ECHOCARDIOGRAM COMPLETE    There are no Patient Instructions on file for this visit.   --Continue cardiac medications as reconciled in final medication list. --Return in about 7 weeks (around 01/11/2023) for Follow up, Coronary artery calcification, Chest pain, Review test results. or sooner if needed. --Continue follow-up with your primary care physician regarding the management of your other chronic comorbid conditions.  Patient's questions and concerns were addressed to her satisfaction. She voices understanding of the instructions provided during this encounter.   This note was created using a voice recognition software as a result there may be grammatical errors inadvertently enclosed that do not reflect the nature of this encounter. Every attempt is made to correct such errors.  [name], [place], [facility]  Pager:  [phone] Office: [phone]

## 2022-11-28 DIAGNOSIS — E785 Hyperlipidemia, unspecified: Secondary | ICD-10-CM | POA: Diagnosis not present

## 2022-11-29 ENCOUNTER — Other Ambulatory Visit: Payer: Self-pay

## 2022-11-29 DIAGNOSIS — E7800 Pure hypercholesterolemia, unspecified: Secondary | ICD-10-CM

## 2022-11-29 DIAGNOSIS — I2510 Atherosclerotic heart disease of native coronary artery without angina pectoris: Secondary | ICD-10-CM

## 2022-11-29 DIAGNOSIS — Z789 Other specified health status: Secondary | ICD-10-CM

## 2022-11-29 MED ORDER — REPATHA SURECLICK 140 MG/ML ~~LOC~~ SOAJ: 140.0000 mg | SUBCUTANEOUS | 0 refills | Status: DC

## 2022-12-06 ENCOUNTER — Telehealth: Payer: Self-pay | Admitting: Cardiology

## 2022-12-09 DIAGNOSIS — L298 Other pruritus: Secondary | ICD-10-CM | POA: Diagnosis not present

## 2022-12-09 DIAGNOSIS — J392 Other diseases of pharynx: Secondary | ICD-10-CM | POA: Diagnosis not present

## 2022-12-09 DIAGNOSIS — Z79899 Other long term (current) drug therapy: Secondary | ICD-10-CM | POA: Diagnosis not present

## 2022-12-09 DIAGNOSIS — T7840XA Allergy, unspecified, initial encounter: Secondary | ICD-10-CM | POA: Diagnosis not present

## 2022-12-09 DIAGNOSIS — T50995A Adverse effect of other drugs, medicaments and biological substances, initial encounter: Secondary | ICD-10-CM | POA: Diagnosis not present

## 2022-12-22 ENCOUNTER — Other Ambulatory Visit: Payer: Self-pay | Admitting: Physician Assistant

## 2022-12-22 DIAGNOSIS — Z1231 Encounter for screening mammogram for malignant neoplasm of breast: Secondary | ICD-10-CM

## 2022-12-22 DIAGNOSIS — T7840XA Allergy, unspecified, initial encounter: Secondary | ICD-10-CM | POA: Diagnosis not present

## 2022-12-22 DIAGNOSIS — E785 Hyperlipidemia, unspecified: Secondary | ICD-10-CM | POA: Diagnosis not present

## 2022-12-22 IMAGING — MG MM DIGITAL SCREENING BILAT W/ TOMO AND CAD
8 series · 9 of 24 positions shown · non-contrast
Comparison: Previous exam(s).

CLINICAL DATA: Screening.

EXAM:
DIGITAL SCREENING BILATERAL MAMMOGRAM WITH TOMOSYNTHESIS AND CAD
TECHNIQUE: Bilateral screening digital craniocaudal and mediolateral oblique
mammograms were obtained. Bilateral screening digital breast
tomosynthesis was performed. The images were evaluated with
computer-aided detection.

[R CC synth-2D]
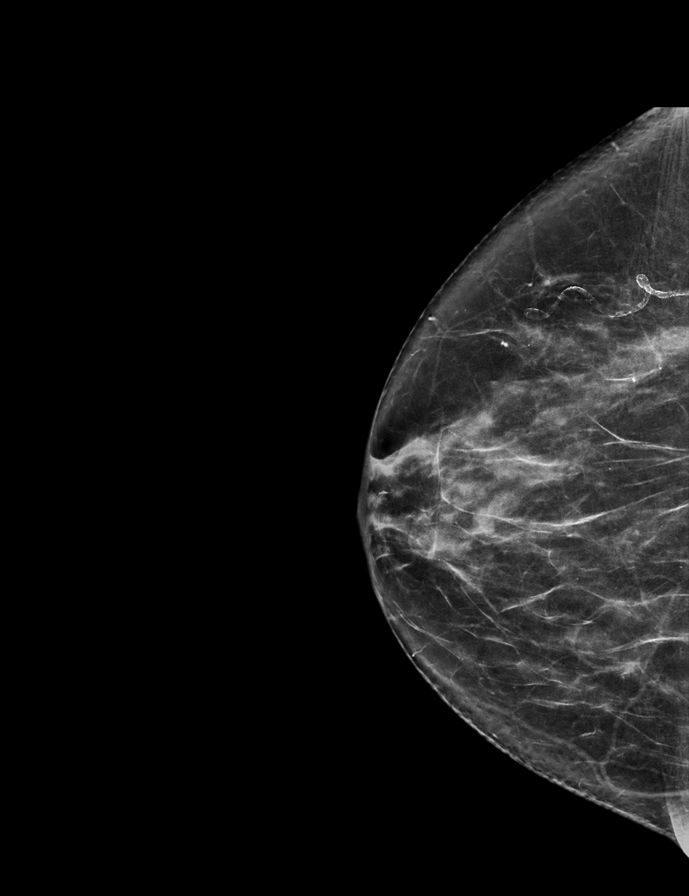

[R MLO synth-2D]
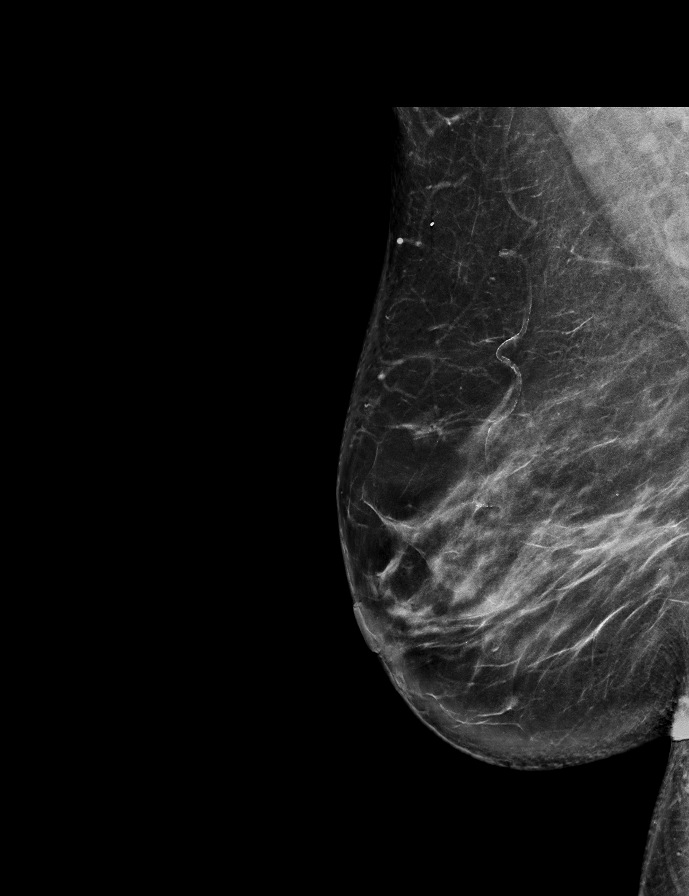

[L MLO synth-2D]
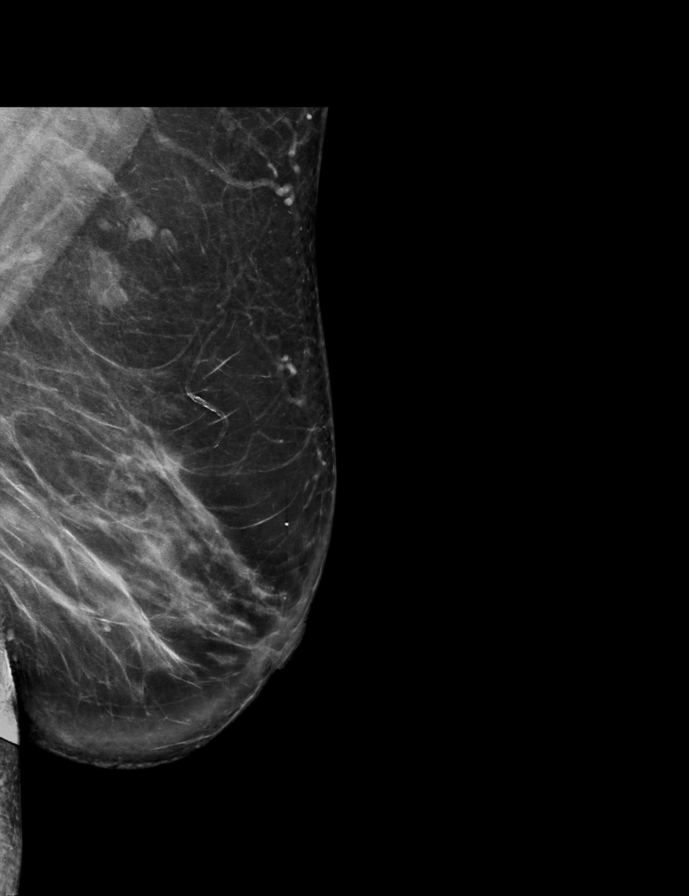

[L CC synth-2D]
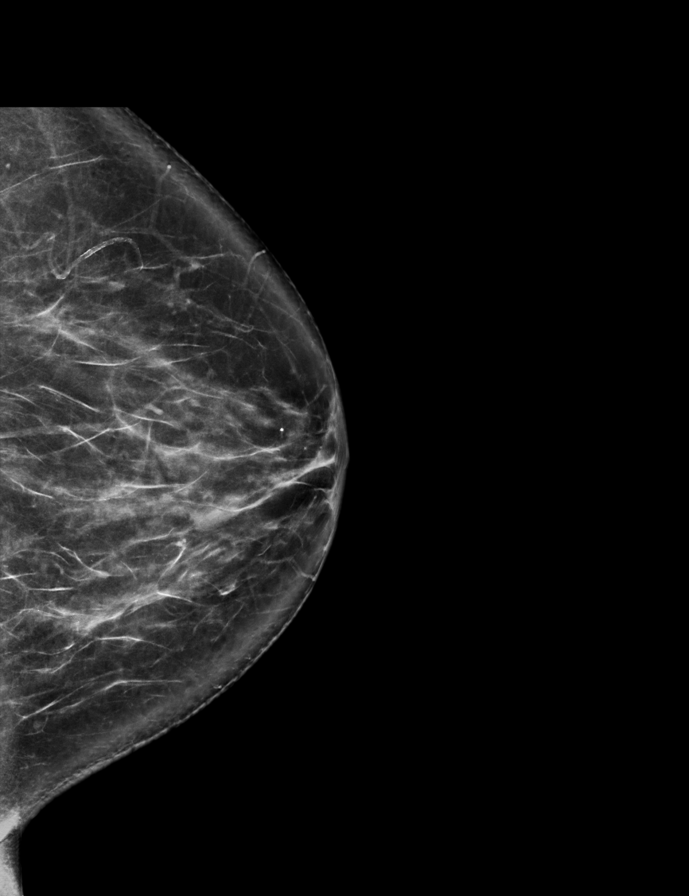

[R CC tomo · 2 of 66 frames shown]
[frame 22/66]
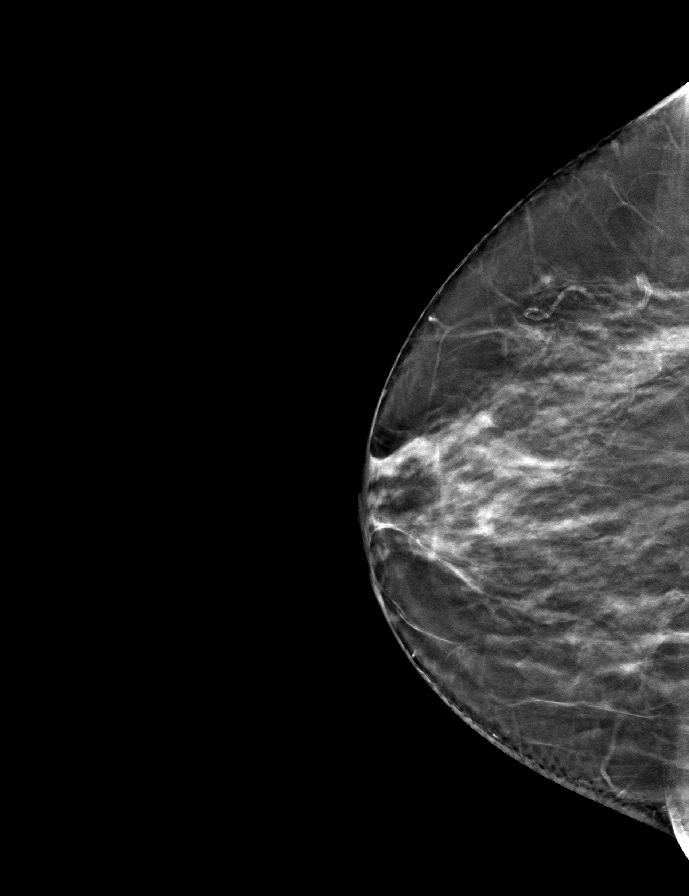
[frame 33/66]
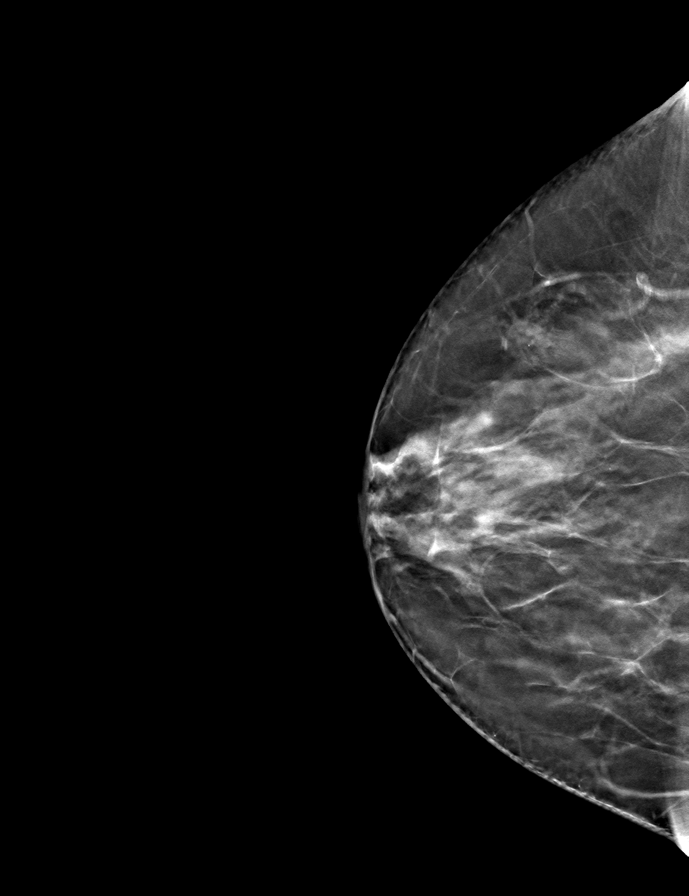

[L CC tomo · tomo slice 38/75.0]
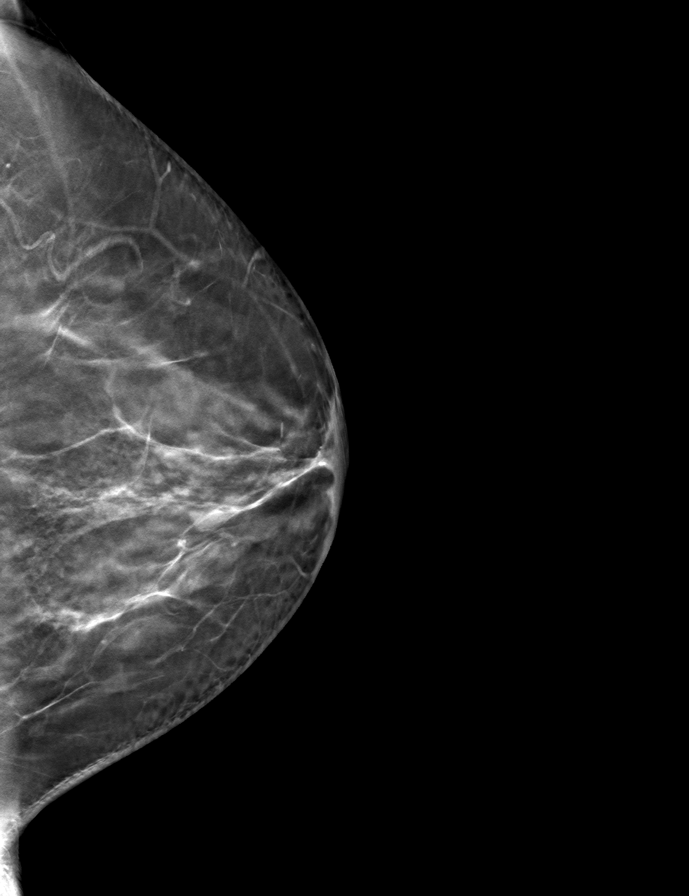

[R MLO tomo · tomo slice 45/88.0]
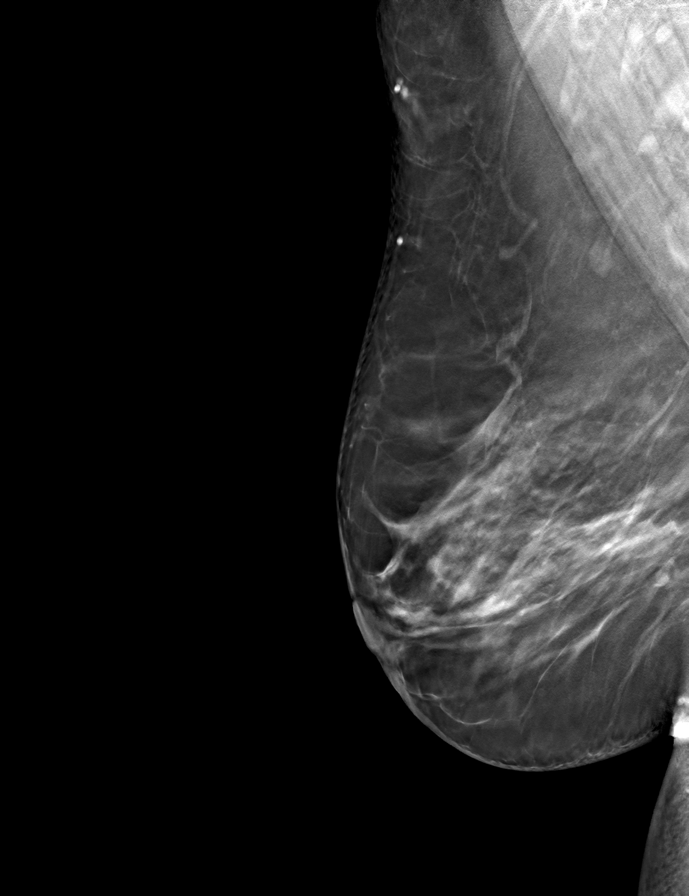

[L MLO tomo · tomo slice 47/93.0]
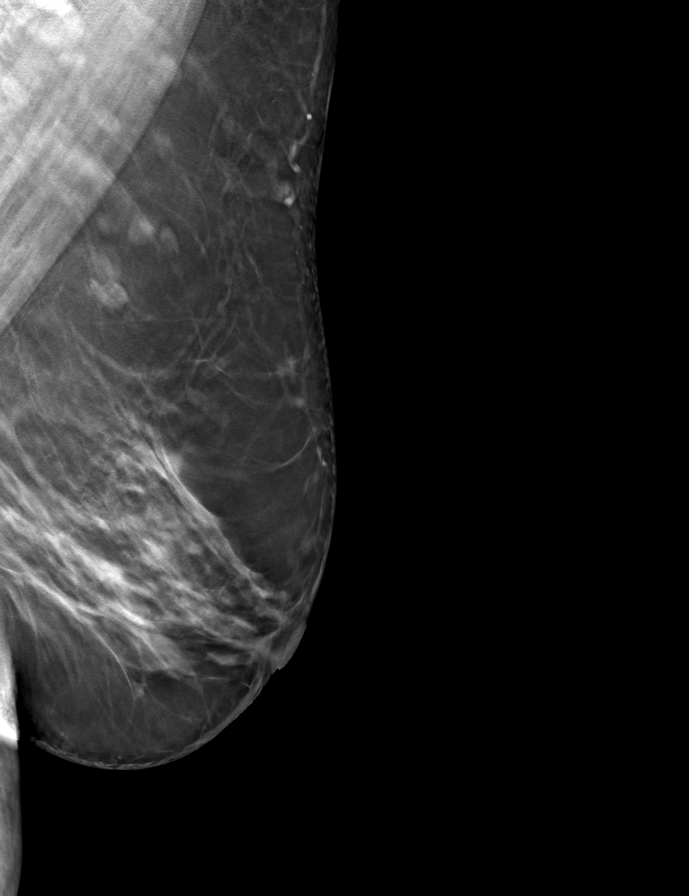

[9 of 24 positions shown; findings below may reference images not displayed]

ACR Breast Density Category c: The breast tissue is heterogeneously
dense, which may obscure small masses.
FINDINGS: There are no findings suspicious for malignancy.
IMPRESSION: No mammographic evidence of malignancy. A result letter of this
screening mammogram will be mailed directly to the patient.

RECOMMENDATION:
Screening mammogram in one year. (Code:[id number])

BI-RADS CATEGORY  1: Negative.

## 2022-12-26 ENCOUNTER — Telehealth: Payer: Self-pay

## 2022-12-26 ENCOUNTER — Ambulatory Visit
Admission: RE | Admit: 2022-12-26 | Discharge: 2022-12-26 | Disposition: A | Discharge status: Home / Self Care | Payer: Medicare Other | Source: Ambulatory Visit | Attending: Physician Assistant | Admitting: Physician Assistant

## 2022-12-26 ENCOUNTER — Ambulatory Visit: Payer: Medicare Other

## 2022-12-26 DIAGNOSIS — I2510 Atherosclerotic heart disease of native coronary artery without angina pectoris: Secondary | ICD-10-CM | POA: Diagnosis not present

## 2022-12-26 DIAGNOSIS — Z1231 Encounter for screening mammogram for malignant neoplasm of breast: Secondary | ICD-10-CM | POA: Diagnosis not present

## 2022-12-26 DIAGNOSIS — R072 Precordial pain: Secondary | ICD-10-CM | POA: Diagnosis not present

## 2022-12-26 NOTE — Telephone Encounter (Signed)
Patient had a severe reaction to repatha, she went to ER for anaphylaxis

## 2022-12-26 NOTE — Telephone Encounter (Signed)
We can review the incidence at the upcoming visit.  She can hold the Repatha for now.   Jaimere Feutz [place], DO, [facility]

## 2022-12-27 NOTE — Telephone Encounter (Signed)
Lvm for pt

## 2023-01-02 DIAGNOSIS — M79672 Pain in left foot: Secondary | ICD-10-CM | POA: Diagnosis not present

## 2023-01-02 DIAGNOSIS — I700 Atherosclerosis of aorta: Secondary | ICD-10-CM | POA: Diagnosis not present

## 2023-01-02 DIAGNOSIS — M79642 Pain in left hand: Secondary | ICD-10-CM | POA: Diagnosis not present

## 2023-01-02 DIAGNOSIS — R52 Pain, unspecified: Secondary | ICD-10-CM | POA: Diagnosis not present

## 2023-01-16 ENCOUNTER — Encounter: Payer: Self-pay | Admitting: Cardiology

## 2023-01-16 ENCOUNTER — Ambulatory Visit: Payer: Medicare Other | Admitting: Cardiology

## 2023-01-16 VITALS — BP 138/78 | HR 74 | Resp 15 | Ht 64.0 in | Wt 145.0 lb

## 2023-01-16 DIAGNOSIS — Z789 Other specified health status: Secondary | ICD-10-CM

## 2023-01-16 DIAGNOSIS — R7303 Prediabetes: Secondary | ICD-10-CM | POA: Diagnosis not present

## 2023-01-16 DIAGNOSIS — I2510 Atherosclerotic heart disease of native coronary artery without angina pectoris: Secondary | ICD-10-CM | POA: Diagnosis not present

## 2023-01-16 DIAGNOSIS — E7800 Pure hypercholesterolemia, unspecified: Secondary | ICD-10-CM | POA: Diagnosis not present

## 2023-01-16 DIAGNOSIS — I10 Essential (primary) hypertension: Secondary | ICD-10-CM | POA: Diagnosis not present

## 2023-01-16 DIAGNOSIS — I2584 Coronary atherosclerosis due to calcified coronary lesion: Secondary | ICD-10-CM | POA: Diagnosis not present

## 2023-01-16 MED ORDER — NEXLIZET 180-10 MG PO TABS
1.0000 | ORAL_TABLET | Freq: Every day | ORAL | 0 refills | Status: DC
Start: 2023-01-16 — End: 2023-06-21

## 2023-01-16 MED ORDER — ATORVASTATIN CALCIUM 20 MG PO TABS
20.0000 mg | ORAL_TABLET | ORAL | Status: DC
Start: 2023-01-16 — End: 2023-06-21

## 2023-01-16 NOTE — Progress Notes (Signed)
ID:  Emily Clarke, DOB [birth year], MRN [id number]  PCP:  Emily Clarke (Inactive)  Cardiologist:  Emily Lerner, DO, [facility] (established care Nov 23, 2022)  Date: 01/16/23 Last Office Visit: 11/23/2022  Chief Complaint  Patient presents with   Follow-up    7 week Coronary calcification Lipid management     HPI  Emily Clarke is a 72 y.o. African-American female whose  past medical history and cardiovascular risk factors include: Moderate CAC, hyperlipidemia, statin intolerance, hypertension, GERD, asthma, prediabetic.   Patient is referred to the practice given her coronary calcification, prior episode of chest pain, and inability to tolerate statins.  In October 2023 she had an episode of precordial discomfort likely noncardiac based on symptoms but did go to the ED via EMS for evaluation.  Thereafter a coronary calcium score was checked by PCP which noted moderate CAC.  Given the new findings she was placed on atorvastatin 20 mg p.o. q. weekly and referred to cardiology for further evaluation and management given her moderate CAC, recent chest pain, and history of statin intolerance.  Patient has been on atorvastatin, simvastatin, rosuvastatin without much success.  At last office visit the shared decision was to proceed with echocardiogram, stress test, and initiation of PCSK9 inhibitors.  Stress test is still pending.  She was started on Repatha and subsequently had a visit at [facility] on 12/09/2022 and based on ER documentation available in Care Everywhere patient experienced throat irritation.  Based on examination it is not consistent with severe reaction or ture allergic reaction.  However patient states that she had an anaphylactic reaction.  She now presents for follow-up.  She is accompanied by her sister Dr. Leeann Clarke who teaches adult education (daughter).  Patient denies anginal chest pain or heart failure symptoms.  ALLERGIES: Allergies  Allergen Reactions    Repatha [Evolocumab] Anaphylaxis   Iodine Hives   Amlodipine Besylate     Other reaction(s): Edema Other reaction(s): Edema   Atorvastatin     Other reaction(s): headaches Other reaction(s): headaches   Cyclobenzaprine     Other reaction(s): headaches Other reaction(s): headaches   Hydrochlorothiazide     Other reaction(s): Urinary frequency Other reaction(s): Urinary frequency    MEDICATION LIST PRIOR TO VISIT: Current Meds  Medication Sig   Ascorbic Acid (VITAMIN C) 500 MG CAPS Take 1 capsule by mouth daily at 6 (six) AM.   aspirin EC 81 MG tablet Take 1 tablet (81 mg total) by mouth daily.   atorvastatin (LIPITOR) 20 MG tablet Take 1 tablet (20 mg total) by mouth once a week.   Bempedoic Acid-Ezetimibe (NEXLIZET) 180-10 MG TABS Take 1 tablet by mouth daily at 12 noon.   Blood Glucose Monitoring Suppl (ONETOUCH VERIO) w/Device KIT See admin instructions.   Elderberry 500 MG CAPS as directed Orally   fluticasone (FLONASE) 50 MCG/ACT nasal spray Place 1 spray into both nostrils daily as needed.   glucose blood (ONETOUCH VERIO) test strip Use to check your blood sugar   Ibuprofen-diphenhydrAMINE Cit 200-38 MG TABS 1 tablet at bedtime as needed Orally Once a day   Lancets (ONETOUCH DELICA PLUS LANCET30G) MISC    Lancets (ONETOUCH ULTRASOFT) lancets See admin instructions.   metoprolol succinate (TOPROL-XL) 25 MG 24 hr tablet Take 1 tablet (25 mg total) by mouth daily.   Omega-3 Fatty Acids (FISH OIL) 1000 MG CAPS Take 1 capsule by mouth daily.   ONETOUCH VERIO test strip 1 each daily.   PROAIR RESPICLICK 108 (90  Base) MCG/ACT AEPB Inhale 1 puff into the lungs daily.   Current Facility-Administered Medications for the 01/16/23 encounter (Office Visit) with Odis Hollingshead, Kelliann Pendergraph, DO  Medication   0.9 %  sodium chloride infusion   0.9 %  sodium chloride infusion     PAST MEDICAL HISTORY: Past Medical History:  Diagnosis Date   Asthma    inhaler- uses PRN   Chronic back pain     COVID    GERD (gastroesophageal reflux disease)    off meds currently   Headache    Hyperlipidemia    does not take meds- does not "like the wasy they make me feel"   Hypertension    on meds   Rotator cuff syndrome of left shoulder    pt denies   Scoliosis    Seasonal allergies    SVT (supraventricular tachycardia)    Tachycardia     PAST SURGICAL HISTORY: Past Surgical History:  Procedure Laterality Date   ABDOMINAL HYSTERECTOMY  1985   BREAST CYST ASPIRATION Left 2014   CHOLECYSTECTOMY  1986   COLONOSCOPY  2017   JMP-MAC-exc prep-polyps x 1   POLYPECTOMY  2017   TUBAL LIGATION  1983   WISDOM TOOTH EXTRACTION      FAMILY HISTORY: The patient family history includes Angina in her mother; Breast cancer (age of onset: 39) in her sister; Cancer - Lung in her mother; Colon polyps in her father; Fibromyalgia in her sister; Healthy in her brother; Heart attack in her father and mother; Heart disease in her father and mother; Hyperlipidemia in her father and mother; Prostate cancer in her father.  SOCIAL HISTORY:  The patient  reports that she has never smoked. She has never used smokeless tobacco. She reports that she does not drink alcohol and does not use drugs.  REVIEW OF SYSTEMS: Review of Systems  Cardiovascular:  Negative for chest pain, claudication, dyspnea on exertion, irregular heartbeat, leg swelling, near-syncope, orthopnea, palpitations, paroxysmal nocturnal dyspnea and syncope.  Respiratory:  Negative for shortness of breath.   Hematologic/Lymphatic: Negative for bleeding problem.  Musculoskeletal:  Negative for muscle cramps and myalgias.  Neurological:  Negative for dizziness and light-headedness.    PHYSICAL EXAM:    01/16/2023    1:28 PM 11/23/2022    2:16 PM 04/24/2022    7:00 AM  Vitals with BMI  Height 5\' 4"  5\' 4"    Weight 145 lbs 146 lbs   BMI 24.88 25.05   Systolic 138 131 161  Diastolic 78 86 75  Pulse 74 72 78    Physical Exam   Constitutional: No distress.  Age appropriate, hemodynamically stable.   Neck: No JVD present.  Cardiovascular: Normal rate, regular rhythm, S1 normal, S2 normal, intact distal pulses and normal pulses. Exam reveals no gallop, no S3 and no S4.  No murmur heard. Pulmonary/Chest: Effort normal and breath sounds normal. No stridor. She has no wheezes. She has no rales.  Abdominal: Soft. Bowel sounds are normal. She exhibits no distension. There is no abdominal tenderness.  Musculoskeletal:        General: No edema.     Cervical back: Neck supple.  Neurological: She is alert and oriented to person, place, and time. She has intact cranial nerves (2-12).  Skin: Skin is warm and moist.     CARDIAC DATABASE: EKG: Nov 23, 2022: Normal sinus rhythm, 64 bpm, normal axis, without underlying ischemia or injury pattern.  Echocardiogram: 12/26/2022: Normal LV systolic function with visual EF 60-65%. Left ventricle  cavity is normal in size. Normal left ventricular wall thickness. Normal global wall motion. Normal diastolic filling pattern, normal LAP.  No significant valvular heart disease.  No prior study for comparison.     Stress Testing: No results found for this or any previous visit from the past 1095 days.   Heart Catheterization: None  CT Cardiac Scoring: July 20, 2022 Left Main: 0 LAD: 86.7 LCx: 63.8 RCA: 0 Total Agatston Score: 151 MESA database percentile: 83  LABORATORY DATA: External Labs: Collected: Nov 02, 2022 provided by PCP. Hemoglobin A1c 6.0. BUN 12, creatinine 0.69. Sodium 142, potassium 3.9, chloride 105, bicarb 30 AST 18, ALT 14, alkaline phosphatase 118. Total cholesterol 207, triglycerides 206, HDL 41, LDL calculated 129, non-HDL 166 Hemoglobin A1c 6.0  IMPRESSION:    ICD-10-CM   1. Coronary atherosclerosis due to calcified coronary lesion  I25.10 atorvastatin (LIPITOR) 20 MG tablet   I25.84 Bempedoic Acid-Ezetimibe (NEXLIZET) 180-10 MG TABS     Lipid Panel With LDL/HDL Ratio    LDL cholesterol, direct    CMP14+EGFR    CMP14+EGFR    LDL cholesterol, direct    Lipid Panel With LDL/HDL Ratio    2. Pure hypercholesterolemia  E78.00 atorvastatin (LIPITOR) 20 MG tablet    Bempedoic Acid-Ezetimibe (NEXLIZET) 180-10 MG TABS    Lipid Panel With LDL/HDL Ratio    LDL cholesterol, direct    CMP14+EGFR    CMP14+EGFR    LDL cholesterol, direct    Lipid Panel With LDL/HDL Ratio    3. Statin intolerance  Z78.9 atorvastatin (LIPITOR) 20 MG tablet    Bempedoic Acid-Ezetimibe (NEXLIZET) 180-10 MG TABS    4. Benign hypertension  I10     5. Prediabetes  R73.03         RECOMMENDATIONS: DOLLIE BRESSI is a 72 y.o. African-American female whose past medical history and cardiac risk factors include: Moderate CAC, hyperlipidemia, hypertension, GERD, asthma, prediabetic.   Coronary atherosclerosis due to calcified coronary lesion Total CAC 151, 83rd percentile Last precordial discomfort in October 2023 which led to the ED visit. No active chest pain. Stress test is still pending. Echocardiogram results reviewed with the patient and sister at today's office visit. Prior EKG: Sinus rhythm without ischemia/injury pattern Continue aspirin Did not tolerate Repatha secondary to throat irritation.  She does not want to try different medication and PCSK9 family. Shared decision was to restart atorvastatin 20 mg p.o. q. weekly as this is the maximally tolerated dose of statin.  Will also start next visit.  Labs in 6 weeks to reevaluate therapy Of note, patient did not want to do Leqvio.  Pure hypercholesterolemia Statin intolerance Has been intolerant to atorvastatin, simvastatin, rosuvastatin Given her CAC she was started on atorvastatin low-dose on a weekly basis Does not want a retrial of PCSK9 inhibitors. She also does not want to do Leqvio infusion every 6 months. Will start with Nexlizet  Benign hypertension Office blood pressures  within acceptable limits. No changes warranted. Currently managed by primary care provider.  Prediabetes Reemphasized importance of glycemic control.   FINAL MEDICATION LIST END OF ENCOUNTER: Meds ordered this encounter  Medications   atorvastatin (LIPITOR) 20 MG tablet    Sig: Take 1 tablet (20 mg total) by mouth once a week.   Bempedoic Acid-Ezetimibe (NEXLIZET) 180-10 MG TABS    Sig: Take 1 tablet by mouth daily at 12 noon.    Dispense:  90 tablet    Refill:  0    Medications Discontinued During This  Encounter  Medication Reason   atorvastatin (LIPITOR) 20 MG tablet       Current Outpatient Medications:    Ascorbic Acid (VITAMIN C) 500 MG CAPS, Take 1 capsule by mouth daily at 6 (six) AM., Disp: , Rfl:    aspirin EC 81 MG tablet, Take 1 tablet (81 mg total) by mouth daily., Disp: 30 tablet, Rfl: 2   atorvastatin (LIPITOR) 20 MG tablet, Take 1 tablet (20 mg total) by mouth once a week., Disp: , Rfl:    Bempedoic Acid-Ezetimibe (NEXLIZET) 180-10 MG TABS, Take 1 tablet by mouth daily at 12 noon., Disp: 90 tablet, Rfl: 0   Blood Glucose Monitoring Suppl (ONETOUCH VERIO) w/Device KIT, See admin instructions., Disp: , Rfl:    Elderberry 500 MG CAPS, as directed Orally, Disp: , Rfl:    fluticasone (FLONASE) 50 MCG/ACT nasal spray, Place 1 spray into both nostrils daily as needed., Disp: , Rfl:    glucose blood (ONETOUCH VERIO) test strip, Use to check your blood sugar, Disp: , Rfl:    Ibuprofen-diphenhydrAMINE Cit 200-38 MG TABS, 1 tablet at bedtime as needed Orally Once a day, Disp: , Rfl:    Lancets (ONETOUCH DELICA PLUS LANCET30G) MISC, , Disp: , Rfl:    Lancets (ONETOUCH ULTRASOFT) lancets, See admin instructions., Disp: , Rfl:    metoprolol succinate (TOPROL-XL) 25 MG 24 hr tablet, Take 1 tablet (25 mg total) by mouth daily., Disp: 90 tablet, Rfl: 0   Omega-3 Fatty Acids (FISH OIL) 1000 MG CAPS, Take 1 capsule by mouth daily., Disp: , Rfl:    ONETOUCH VERIO test strip, 1 each  daily., Disp: , Rfl:    PROAIR RESPICLICK 108 (90 Base) MCG/ACT AEPB, Inhale 1 puff into the lungs daily., Disp: , Rfl:    calcium carbonate (OSCAL) 1500 (600 Ca) MG TABS tablet, Take 1 tablet by mouth daily at 6 (six) AM. (Patient not taking: Reported on 01/16/2023), Disp: , Rfl:   Current Facility-Administered Medications:    0.9 %  sodium chloride infusion, 500 mL, Intravenous, Continuous, Pyrtle, Carie Caddy, MD   0.9 %  sodium chloride infusion, , Intravenous, PRN, Janetta Hora, PA-C  Orders Placed This Encounter  Procedures   Lipid Panel With LDL/HDL Ratio   LDL cholesterol, direct   ZOX09+[id number]    There are no Patient Instructions on file for this visit.   --Continue cardiac medications as reconciled in final medication list. --Return in about 7 weeks (around 03/06/2023) for Coronary artery calcification, Lipid, Review test results. or sooner if needed. --Continue follow-up with your primary care physician regarding the management of your other chronic comorbid conditions.  Patient's questions and concerns were addressed to her satisfaction. She voices understanding of the instructions provided during this encounter.   This note was created using a voice recognition software as a result there may be grammatical errors inadvertently enclosed that do not reflect the nature of this encounter. Every attempt is made to correct such errors.  Emily Clarke, [place], [facility]  Pager:  [phone] Office: [phone]

## 2023-02-14 NOTE — Telephone Encounter (Signed)
done

## 2023-03-06 DIAGNOSIS — Z0000 Encounter for general adult medical examination without abnormal findings: Secondary | ICD-10-CM | POA: Diagnosis not present

## 2023-03-06 DIAGNOSIS — I10 Essential (primary) hypertension: Secondary | ICD-10-CM | POA: Diagnosis not present

## 2023-03-06 DIAGNOSIS — R7303 Prediabetes: Secondary | ICD-10-CM | POA: Diagnosis not present

## 2023-03-06 DIAGNOSIS — H5461 Unqualified visual loss, right eye, normal vision left eye: Secondary | ICD-10-CM | POA: Diagnosis not present

## 2023-03-06 DIAGNOSIS — Z789 Other specified health status: Secondary | ICD-10-CM | POA: Diagnosis not present

## 2023-03-06 DIAGNOSIS — E785 Hyperlipidemia, unspecified: Secondary | ICD-10-CM | POA: Diagnosis not present

## 2023-03-06 DIAGNOSIS — I2510 Atherosclerotic heart disease of native coronary artery without angina pectoris: Secondary | ICD-10-CM | POA: Diagnosis not present

## 2023-03-06 DIAGNOSIS — E7800 Pure hypercholesterolemia, unspecified: Secondary | ICD-10-CM | POA: Diagnosis not present

## 2023-03-06 DIAGNOSIS — Z13220 Encounter for screening for lipoid disorders: Secondary | ICD-10-CM | POA: Diagnosis not present

## 2023-03-06 DIAGNOSIS — I2584 Coronary atherosclerosis due to calcified coronary lesion: Secondary | ICD-10-CM | POA: Diagnosis not present

## 2023-03-08 ENCOUNTER — Ambulatory Visit: Payer: Medicare Other | Admitting: Cardiology

## 2023-04-02 DIAGNOSIS — R072 Precordial pain: Secondary | ICD-10-CM | POA: Diagnosis not present

## 2023-04-03 ENCOUNTER — Ambulatory Visit: Payer: Medicare Other | Admitting: Cardiology

## 2023-04-03 DIAGNOSIS — H04123 Dry eye syndrome of bilateral lacrimal glands: Secondary | ICD-10-CM | POA: Diagnosis not present

## 2023-04-03 DIAGNOSIS — H3581 Retinal edema: Secondary | ICD-10-CM | POA: Diagnosis not present

## 2023-04-06 DIAGNOSIS — R072 Precordial pain: Secondary | ICD-10-CM | POA: Diagnosis not present

## 2023-04-08 ENCOUNTER — Other Ambulatory Visit: Payer: Self-pay | Admitting: Cardiology

## 2023-04-08 DIAGNOSIS — E7800 Pure hypercholesterolemia, unspecified: Secondary | ICD-10-CM

## 2023-04-08 DIAGNOSIS — R072 Precordial pain: Secondary | ICD-10-CM

## 2023-04-08 DIAGNOSIS — I2510 Atherosclerotic heart disease of native coronary artery without angina pectoris: Secondary | ICD-10-CM

## 2023-04-12 ENCOUNTER — Telehealth (HOSPITAL_COMMUNITY): Payer: Self-pay | Admitting: *Deleted

## 2023-04-12 NOTE — Telephone Encounter (Signed)
Pt given instructions for MPI study.

## 2023-04-19 ENCOUNTER — Ambulatory Visit (HOSPITAL_COMMUNITY): Payer: Medicare Other | Attending: Cardiology

## 2023-04-19 DIAGNOSIS — I2584 Coronary atherosclerosis due to calcified coronary lesion: Secondary | ICD-10-CM | POA: Diagnosis not present

## 2023-04-19 DIAGNOSIS — I2510 Atherosclerotic heart disease of native coronary artery without angina pectoris: Secondary | ICD-10-CM | POA: Diagnosis not present

## 2023-04-19 DIAGNOSIS — R072 Precordial pain: Secondary | ICD-10-CM | POA: Diagnosis not present

## 2023-04-19 LAB — MYOCARDIAL PERFUSION IMAGING
Angina Index: 0
Base ST Depression (mm): 0 mm
Duke Treadmill Score: 6
Estimated workload: 7
Exercise duration (min): 6 min
Exercise duration (sec): 1 s
LV dias vol: 53 mL (ref 46–106)
LV sys vol: 19 mL
MPHR: 151 {beats}/min
Nuc Stress EF: 65 %
Peak HR: 136 {beats}/min
Percent HR: 90 %
Rest HR: 63 {beats}/min
Rest Nuclear Isotope Dose: 9.8 mCi
SDS: 0
SRS: 0
SSS: 0
ST Depression (mm): 0 mm
Stress Nuclear Isotope Dose: 31.1 mCi
TID: 1.01

## 2023-04-19 MED ORDER — TECHNETIUM TC 99M TETROFOSMIN IV KIT
9.8000 | PACK | Freq: Once | INTRAVENOUS | Status: AC | PRN
  Administered 2023-04-19: 9.8 via INTRAVENOUS

## 2023-04-19 MED ORDER — TECHNETIUM TC 99M TETROFOSMIN IV KIT
31.1000 | PACK | Freq: Once | INTRAVENOUS | Status: AC | PRN
  Administered 2023-04-19: 31.1 via INTRAVENOUS

## 2023-04-26 ENCOUNTER — Emergency Department (HOSPITAL_COMMUNITY)
Admission: EM | Admit: 2023-04-26 | Discharge: 2023-04-26 | Disposition: A | Discharge status: Home / Self Care | Payer: Medicare Other | Attending: Emergency Medicine | Admitting: Emergency Medicine

## 2023-04-26 ENCOUNTER — Other Ambulatory Visit: Payer: Self-pay

## 2023-04-26 DIAGNOSIS — I499 Cardiac arrhythmia, unspecified: Secondary | ICD-10-CM | POA: Diagnosis not present

## 2023-04-26 DIAGNOSIS — R0789 Other chest pain: Secondary | ICD-10-CM | POA: Diagnosis not present

## 2023-04-26 DIAGNOSIS — Z79899 Other long term (current) drug therapy: Secondary | ICD-10-CM | POA: Insufficient documentation

## 2023-04-26 DIAGNOSIS — R079 Chest pain, unspecified: Secondary | ICD-10-CM | POA: Diagnosis not present

## 2023-04-26 DIAGNOSIS — R200 Anesthesia of skin: Secondary | ICD-10-CM | POA: Insufficient documentation

## 2023-04-26 DIAGNOSIS — E876 Hypokalemia: Secondary | ICD-10-CM | POA: Diagnosis not present

## 2023-04-26 DIAGNOSIS — R000 Tachycardia, unspecified: Secondary | ICD-10-CM | POA: Diagnosis not present

## 2023-04-26 DIAGNOSIS — Z7982 Long term (current) use of aspirin: Secondary | ICD-10-CM | POA: Insufficient documentation

## 2023-04-26 DIAGNOSIS — R6889 Other general symptoms and signs: Secondary | ICD-10-CM | POA: Diagnosis not present

## 2023-04-26 DIAGNOSIS — I471 Supraventricular tachycardia, unspecified: Secondary | ICD-10-CM | POA: Diagnosis not present

## 2023-04-26 DIAGNOSIS — Z743 Need for continuous supervision: Secondary | ICD-10-CM | POA: Diagnosis not present

## 2023-04-26 LAB — I-STAT CHEM 8, ED
BUN: 7 mg/dL — ABNORMAL LOW (ref 8–23)
Calcium, Ion: 1.17 mmol/L (ref 1.15–1.40)
Chloride: 110 mmol/L (ref 98–111)
Creatinine, Ser: 0.5 mg/dL (ref 0.44–1.00)
Glucose, Bld: 101 mg/dL — ABNORMAL HIGH (ref 70–99)
HCT: 32 % — ABNORMAL LOW (ref 36.0–46.0)
Hemoglobin: 10.9 g/dL — ABNORMAL LOW (ref 12.0–15.0)
Potassium: 3.1 mmol/L — ABNORMAL LOW (ref 3.5–5.1)
Sodium: 147 mmol/L — ABNORMAL HIGH (ref 135–145)
TCO2: 23 mmol/L (ref 22–32)

## 2023-04-26 MED ORDER — METOPROLOL TARTRATE 5 MG/5ML IV SOLN
5.0000 mg | Freq: Once | INTRAVENOUS | Status: AC
  Administered 2023-04-26: 5 mg via INTRAVENOUS
  Filled 2023-04-26: qty 5

## 2023-04-26 MED ORDER — POTASSIUM CHLORIDE 20 MEQ PO PACK
40.0000 meq | PACK | Freq: Every day | ORAL | Status: DC
  Administered 2023-04-26: 40 meq via ORAL
  Filled 2023-04-26: qty 2

## 2023-04-26 MED ORDER — POTASSIUM CHLORIDE CRYS ER 20 MEQ PO TBCR: 20.0000 meq | EXTENDED_RELEASE_TABLET | Freq: Two times a day (BID) | ORAL | 0 refills | Status: DC

## 2023-04-26 NOTE — Discharge Instructions (Signed)
Your heart rhythm was in a rapid rhythm on arrival.  You were noted to have some low potassium.  You are given potassium here.  Your heart has been converted to a normal sinus rhythm.  Please take potassium as prescribed. Please follow-up outpatient with your cardiologist Return if you are having any new or worsening symptoms at any time

## 2023-04-26 NOTE — ED Notes (Signed)
Pt verbalized understanding of discharge instructions. Pt ambulatory at time of discharge. IV removed.

## 2023-04-26 NOTE — ED Notes (Signed)
Got patient into gown on the monitor did EKG shown to er provider patient is resting with call bell in reach

## 2023-04-26 NOTE — ED Provider Notes (Signed)
[place] EMERGENCY DEPARTMENT AT [facility] Provider Note   CSN: [id number] Arrival date & time: 04/26/23  1144     History  Chief Complaint  Patient presents with   Chest Pain   Numbness    Emily Clarke is a 72 y.o. female.  HPI 72 year old female history of SVT presents today saying that she began feeling abnormal at 737 this morning.  Before that she was in her usual state of health.  She felt like she had a little bit of paresthesia to her left arm and some palpitations.  She denies any weakness, current numbness, chest pain, shortness of breath, or other new symptoms.  She denies history of DVT or PE.  She is not on any blood thinners.  She took her usual medications which included metoprolol.    Home Medications Prior to Admission medications   Medication Sig Start Date End Date Taking? Authorizing Provider  Ascorbic Acid (VITAMIN C) 500 MG CAPS Take 1 capsule by mouth daily at 6 (six) AM.    [provider]  aspirin EC 81 MG tablet Take 1 tablet (81 mg total) by mouth daily. 08/23/15   Margarita Grizzle, MD  atorvastatin (LIPITOR) 20 MG tablet Take 1 tablet (20 mg total) by mouth once a week. 01/16/23 04/16/23  Tolia, Sunit, DO  Blood Glucose Monitoring Suppl (ONETOUCH VERIO) w/Device KIT See admin instructions. 09/25/19   [provider]  calcium carbonate (OSCAL) 1500 (600 Ca) MG TABS tablet Take 1 tablet by mouth daily at 6 (six) AM. Patient not taking: Reported on 01/16/2023    [provider]  Elderberry 500 MG CAPS as directed Orally    [provider]  fluticasone (FLONASE) 50 MCG/ACT nasal spray Place 1 spray into both nostrils daily as needed.    [provider]  glucose blood (ONETOUCH VERIO) test strip Use to check your blood sugar 09/08/20   [provider]  Ibuprofen-diphenhydrAMINE Cit 200-38 MG TABS 1 tablet at bedtime as needed Orally Once a day    [provider]  Lancets Letta Pate DELICA  PLUS LANCET30G) MISC  05/20/21   [provider]  Lancets (ONETOUCH ULTRASOFT) lancets See admin instructions. 09/25/19   [provider]  metoprolol succinate (TOPROL-XL) 25 MG 24 hr tablet Take 1 tablet (25 mg total) by mouth daily. 08/23/15   Margarita Grizzle, MD  Omega-3 Fatty Acids (FISH OIL) 1000 MG CAPS Take 1 capsule by mouth daily.    [provider]  [facility] VERIO test strip 1 each daily. 06/07/21   [provider]  potassium chloride SA (KLOR-CON M) 20 MEQ tablet Take 1 tablet (20 mEq total) by mouth 2 (two) times daily. 04/26/23  Yes Margarita Grizzle, MD  PROAIR RESPICLICK 108 [phone] Base) MCG/ACT AEPB Inhale 1 puff into the lungs daily. 03/31/21   [provider]      Allergies    Repatha [evolocumab], Iodine, Amlodipine besylate, Atorvastatin, Cyclobenzaprine, and Hydrochlorothiazide    Review of Systems   Review of Systems  Physical Exam Updated Vital Signs BP (!) 153/108 (BP Location: Right Arm)   Pulse (!) 126   Temp 98.3 F (36.8 C) (Oral)   Resp 17   Ht 1.638 m (5' 4.5")   Wt 69.4 kg   SpO2 95%   BMI 25.86 kg/m  Physical Exam Vitals reviewed.  Constitutional:      Appearance: She is well-developed.  HENT:     Head: Normocephalic.  Eyes:  Pupils: Pupils are equal, round, and reactive to light.  Cardiovascular:     Rate and Rhythm: Regular rhythm. Tachycardia present.     Pulses:          Carotid pulses are 1+ on the right side and 1+ on the left side.    Heart sounds: Normal heart sounds.  Pulmonary:     Effort: Pulmonary effort is normal.     Breath sounds: Normal breath sounds.  Abdominal:     General: Bowel sounds are normal.     Palpations: Abdomen is soft.  Musculoskeletal:        General: Normal range of motion.     Cervical back: Normal range of motion.  Skin:    General: Skin is warm and dry.     Capillary Refill: Capillary refill takes less than 2 seconds.  Neurological:     General: No focal deficit  present.     Mental Status: She is alert.     ED Results / Procedures / Treatments   Labs (all labs ordered are listed, but only abnormal results are displayed) Labs Reviewed  I-STAT CHEM 8, ED - Abnormal; Notable for the following components:      Result Value   Sodium 147 (*)    Potassium 3.1 (*)    BUN 7 (*)    Glucose, Bld 101 (*)    Hemoglobin 10.9 (*)    HCT 32.0 (*)    All other components within normal limits    EKG EKG Interpretation Date/Time:  Thursday April 26 2023 11:52:31 EDT Ventricular Rate:  133 PR Interval:  218 QRS Duration:  88 QT Interval:  319 QTC Calculation: 475 R Axis:   61  Text Interpretation: Sinus or ectopic atrial tachycardia Prolonged PR interval ST depression, consider ischemia, diffuse lds Confirmed by Margarita Grizzle [phone]) on 04/26/2023 11:59:24 AM  Radiology No results found.  Procedures .Critical Care  Performed by: Margarita Grizzle, MD Authorized by: Margarita Grizzle, MD   Critical care provider statement:    Critical care time (minutes):  30   Critical care end time:  04/26/2023 12:45 PM   Critical care was time spent personally by me on the following activities:  Development of treatment plan with patient or surrogate, discussions with consultants, evaluation of patient's response to treatment, examination of patient, ordering and review of laboratory studies, ordering and review of radiographic studies, ordering and performing treatments and interventions, pulse oximetry, re-evaluation of patient's condition and review of old charts     Medications Ordered in ED Medications  potassium chloride (KLOR-CON) packet 40 mEq (has no administration in time range)  metoprolol tartrate (LOPRESSOR) injection 5 mg (5 mg Intravenous Given 04/26/23 1207)    ED Course/ Medical Decision Making/ A&P Clinical Course as of 04/26/23 1245  Thu Apr 26, 2023  1208 Patient with tachycardia and report of svt with rapid rhythm on monitor Carotid  sinus massage done with patient reverting to nsr [DR]  1236 I-STAT 8 reviewed and interpreted significant for hemoglobin decreased to 10.9 from baseline of 12 with mild hypokalemia at 3.1 [DR]    Clinical Course User Index [DR] Margarita Grizzle, MD                                 Medical Decision Making Risk Prescription drug management.  Patient presented today with onset of generalized not feeling normal at 737.  Here in the ED  she was evaluated with EKG and found to be tachycardic with a rate of 125.  Patient has a history of SVT.  She converted to normal sinus rhythm with carotid sinus massage.  She feels improved.  Labs were obtained and she is slightly hypokalemic with potassium 3.1 and hemoglobin has decreased to 11 from 12.  She reports no bleeding. This may have been precipitated by hypokalemia.  She did reports not normally taking potassium.  Plan oral repletion here.  Will put her on a weeks worth of potassium.  She is instructed have recheck by her physician this week. She has previously followed with cardiology.  We have discussed return precautions and need for follow-up and she voiced understanding.         Final Clinical Impression(s) / ED Diagnoses Final diagnoses:  SVT (supraventricular tachycardia) (HCC)  Hypokalemia    Rx / DC Orders ED Discharge Orders          Ordered    potassium chloride SA (KLOR-CON M) 20 MEQ tablet  2 times daily        04/26/23 1244              Margarita Grizzle, MD 04/26/23 1245

## 2023-04-26 NOTE — ED Triage Notes (Addendum)
Pt. [facility] coming from urgent care [facility]. Pt. Reported feeling weird around 0730 this morning at work w/numbness on left side of face, arms and lle. Pt.also reports chest tightness and tightness around forehead described as a rubber band around her head. Numbness has subside a little bit since ems arrival. Reports no HA/dizziness.   Hx of svt and allergy to iodine/contrast dye.  Pt. Had stress test done last week. Stress test results were good per patient.   HR 140 BP 130/90 98 RA  18 LAC Bs 128 Rr20/min

## 2023-04-30 DIAGNOSIS — R1013 Epigastric pain: Secondary | ICD-10-CM | POA: Diagnosis not present

## 2023-04-30 DIAGNOSIS — R051 Acute cough: Secondary | ICD-10-CM | POA: Diagnosis not present

## 2023-04-30 DIAGNOSIS — M79602 Pain in left arm: Secondary | ICD-10-CM | POA: Diagnosis not present

## 2023-04-30 DIAGNOSIS — R6884 Jaw pain: Secondary | ICD-10-CM | POA: Diagnosis not present

## 2023-04-30 DIAGNOSIS — E870 Hyperosmolality and hypernatremia: Secondary | ICD-10-CM | POA: Diagnosis not present

## 2023-04-30 DIAGNOSIS — R109 Unspecified abdominal pain: Secondary | ICD-10-CM | POA: Diagnosis not present

## 2023-04-30 DIAGNOSIS — R5383 Other fatigue: Secondary | ICD-10-CM | POA: Diagnosis not present

## 2023-05-01 DIAGNOSIS — M79602 Pain in left arm: Secondary | ICD-10-CM | POA: Diagnosis not present

## 2023-05-01 DIAGNOSIS — R6884 Jaw pain: Secondary | ICD-10-CM | POA: Diagnosis not present

## 2023-05-03 DIAGNOSIS — R7303 Prediabetes: Secondary | ICD-10-CM | POA: Diagnosis not present

## 2023-05-03 DIAGNOSIS — I2510 Atherosclerotic heart disease of native coronary artery without angina pectoris: Secondary | ICD-10-CM | POA: Diagnosis not present

## 2023-05-03 DIAGNOSIS — R000 Tachycardia, unspecified: Secondary | ICD-10-CM | POA: Diagnosis not present

## 2023-05-03 DIAGNOSIS — I10 Essential (primary) hypertension: Secondary | ICD-10-CM | POA: Diagnosis not present

## 2023-05-03 DIAGNOSIS — I471 Supraventricular tachycardia, unspecified: Secondary | ICD-10-CM | POA: Diagnosis not present

## 2023-05-15 DIAGNOSIS — G4700 Insomnia, unspecified: Secondary | ICD-10-CM | POA: Diagnosis not present

## 2023-05-15 DIAGNOSIS — Z13220 Encounter for screening for lipoid disorders: Secondary | ICD-10-CM | POA: Diagnosis not present

## 2023-05-15 DIAGNOSIS — M791 Myalgia, unspecified site: Secondary | ICD-10-CM | POA: Diagnosis not present

## 2023-05-15 DIAGNOSIS — I10 Essential (primary) hypertension: Secondary | ICD-10-CM | POA: Diagnosis not present

## 2023-06-21 ENCOUNTER — Telehealth: Payer: Self-pay | Admitting: Cardiology

## 2023-06-21 ENCOUNTER — Ambulatory Visit: Payer: Medicare Other | Attending: Cardiology | Admitting: Cardiology

## 2023-06-21 ENCOUNTER — Other Ambulatory Visit (HOSPITAL_COMMUNITY): Payer: Self-pay

## 2023-06-21 VITALS — BP 120/80 | HR 85 | Resp 16 | Ht 64.0 in | Wt 150.8 lb

## 2023-06-21 DIAGNOSIS — I471 Supraventricular tachycardia, unspecified: Secondary | ICD-10-CM

## 2023-06-21 DIAGNOSIS — I2584 Coronary atherosclerosis due to calcified coronary lesion: Secondary | ICD-10-CM | POA: Diagnosis not present

## 2023-06-21 DIAGNOSIS — I10 Essential (primary) hypertension: Secondary | ICD-10-CM

## 2023-06-21 DIAGNOSIS — I2510 Atherosclerotic heart disease of native coronary artery without angina pectoris: Secondary | ICD-10-CM | POA: Diagnosis not present

## 2023-06-21 DIAGNOSIS — Z789 Other specified health status: Secondary | ICD-10-CM | POA: Diagnosis not present

## 2023-06-21 DIAGNOSIS — E7800 Pure hypercholesterolemia, unspecified: Secondary | ICD-10-CM

## 2023-06-21 MED ORDER — ATORVASTATIN CALCIUM 20 MG PO TABS: 20.0000 mg | ORAL_TABLET | ORAL | 3 refills | Status: AC

## 2023-06-21 MED ORDER — NEXLIZET 180-10 MG PO TABS
1.0000 | ORAL_TABLET | Freq: Every day | ORAL | 0 refills | Status: AC
  Filled 2023-06-21 – 2023-07-05 (×2): qty 90, 90d supply, fill #0

## 2023-06-21 NOTE — Progress Notes (Signed)
Cardiology Office Note:  .   Date:  06/28/2023  ID:  Emily Clarke, DOB 17-Nov-1950, MRN [id number] PCP:  Scifres, Peter Minium (Inactive)  Former Cardiology Providers: None [facility] Providers Cardiologist:  [name], DO , [facility] (established care May 2024) Electrophysiologist:  None  Click to update primary MD,subspecialty MD or APP then REFRESH:1}    Chief Complaint  Patient presents with   Coronary atherosclerosis due to calcified coronary lesion   Follow-up    History of Present Illness: .   Emily Clarke is a 72 y.o. African-American female whose past medical history and cardiovascular risk factors includes: Moderate CAC, hyperlipidemia, statin intolerance, hypertension, GERD, asthma, prediabetic.   Patient was referred to the practice given her coronary calcification, prior episodes of chest pain, and history of statin intolerance.  In the past she had a workup for precordial pain which included coronary calcium score which was noted to be moderate in severity.  She was started on statin therapy and was noted to be intolerant to it.  She has tried atorvastatin, simvastatin, rosuvastatin without much success.  She was later transition to PCSK9 inhibitor.  But after starting Repatha she had a visit at [facility] in June 2024 please refer to the ER documentation in Care Everywhere.  She had experienced throat irritation and based on ER documentation not a true allergic reaction but according to the patient's perspective she had an anaphylactic reaction.  At last visit the shared decision was to restart Lipitor 20 mg p.o. nightly as this was the maximally tolerated dose in the past.  We also added Nexlizet.   Since last office visit she had an ED appointment in October 2024 due to palpitations, paresthesias of the left arm.  It was felt that her PSVT may have been attributed to hypokalemia.  She converted to normal sinus rhythm after carotid massage.  Her electrolytes  were replaced and later discharged home.  She presents today for 33-month follow-up visit.  From a cardiovascular standpoint she denies any anginal chest pain or heart failure symptoms.  For reasons unknown patient states that she is currently not taking Lipitor 20 mg p.o. daily as she is having difficulty with insurance approval.  She does not know much details with regards to this matter.  I have informed her that this is a generic medication and less likely to have coverage issues.  Review of Systems: .   Review of Systems  Cardiovascular:  Negative for chest pain, claudication, irregular heartbeat, leg swelling, near-syncope, orthopnea, palpitations, paroxysmal nocturnal dyspnea and syncope.  Respiratory:  Negative for shortness of breath.   Hematologic/Lymphatic: Negative for bleeding problem.    Studies Reviewed:   EKG: Nov 23, 2022: Normal sinus rhythm, 64 bpm, normal axis, without underlying ischemia or injury pattern. 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  Compared to prior tracing narrow complex tachycardia has replaced sinus rhythm  Echocardiogram: June 2024: LVEF 60 to 65%. Normal diastolic filling pattern. No significant valvular heart disease.  Stress Testing: Nuclear stress test October 2024: Low risk study.  See report for additional details  CT Cardiac Scoring: July 20, 2022 Left Main: 0 LAD: 86.7 LCx: 63.8 RCA: 0 Total Agatston Score: 151 MESA database percentile: 83  RADIOLOGY: N/A  Risk Assessment/Calculations:   N/A   Labs:    External Labs: Collected: Nov 02, 2022 provided by PCP. Hemoglobin A1c 6.0. BUN 12, creatinine 0.69. Sodium 142, potassium 3.9, chloride  105, bicarb 30 AST 18, ALT 14, alkaline phosphatase 118. Total cholesterol 207, triglycerides 206, HDL 41, LDL calculated 129, non-HDL 166 Hemoglobin A1c 6.0  Physical Exam:    Today's Vitals   06/21/23 1050   BP: 120/80  Pulse: 85  Resp: 16  SpO2: 94%  Weight: 150 lb 12.8 oz (68.4 kg)  Height: 5\' 4"  (1.626 m)   Body mass index is 25.88 kg/m. Wt Readings from Last 3 Encounters:  06/21/23 150 lb 12.8 oz (68.4 kg)  04/26/23 153 lb (69.4 kg)  01/16/23 145 lb (65.8 kg)    Physical Exam  Constitutional: No distress.  Age appropriate, hemodynamically stable.   Neck: No JVD present.  Cardiovascular: Normal rate, regular rhythm, S1 normal, S2 normal, intact distal pulses and normal pulses. Exam reveals no gallop, no S3 and no S4.  No murmur heard. Pulmonary/Chest: Effort normal and breath sounds normal. No stridor. She has no wheezes. She has no rales.  Abdominal: Soft. Bowel sounds are normal. She exhibits no distension. There is no abdominal tenderness.  Musculoskeletal:        General: No edema.     Cervical back: Neck supple.  Neurological: She is alert and oriented to person, place, and time. She has intact cranial nerves (2-12).  Skin: Skin is warm and moist.     Impression & Recommendation(s):  Impression:   ICD-10-CM   1. Coronary atherosclerosis due to calcified coronary lesion  I25.10 Bempedoic Acid-Ezetimibe (NEXLIZET) 180-10 MG TABS   I25.84 atorvastatin (LIPITOR) 20 MG tablet    Comprehensive metabolic panel    Lipid panel    LDL cholesterol, direct    LDL cholesterol, direct    Lipid panel    Comprehensive metabolic panel    2. Pure hypercholesterolemia  E78.00 Bempedoic Acid-Ezetimibe (NEXLIZET) 180-10 MG TABS    atorvastatin (LIPITOR) 20 MG tablet    Comprehensive metabolic panel    Lipid panel    LDL cholesterol, direct    LDL cholesterol, direct    Lipid panel    Comprehensive metabolic panel    3. Statin intolerance  Z78.9 Bempedoic Acid-Ezetimibe (NEXLIZET) 180-10 MG TABS    atorvastatin (LIPITOR) 20 MG tablet    Comprehensive metabolic panel    Lipid panel    LDL cholesterol, direct    LDL cholesterol, direct    Lipid panel    Comprehensive metabolic  panel    4. PSVT (paroxysmal supraventricular tachycardia) (HCC)  I47.10     5. Benign hypertension  I10        Recommendation(s):  Coronary atherosclerosis due to calcified coronary lesion Total CAC 151, 83rd percentile Denies anginal chest pain. Has undergone appropriate ischemic workup as outlined above. Currently on aspirin 81 mg p.o. daily. Lipid-lowering agents we discussed at today's visit as noted below. No additional testing warranted at this time  Pure hypercholesterolemia Statin intolerance Has been intolerant to atorvastatin, simvastatin, rosuvastatin. The maximally tolerated dose of atorvastatin that she was able to tolerate in the past was 20 mg p.o. daily.  Prescription resent to the pharmacy.  I also sent in a prescription for Nexlizet.  Medication profile discussed. She did not tolerate Repatha in the past secondary to throat irritation and does not want to try different pharmacological agent from PCSK9 family. 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 She converted to  normal rhythm after carotid massage. Electrolytes were replaced and she was monitored in the ED. No reoccurrence of symptoms. Continue Toprol-XL 25 mg p.o. daily  Benign hypertension Office blood pressures are very well-controlled. Reemphasized importance of low-salt diet.. Continue Toprol-XL 25 mg p.o. daily. Continue olmesartan 20 mg p.o. daily.   Orders Placed:  Orders Placed This Encounter  Procedures   Comprehensive metabolic panel    Standing Status:   Future    Number of Occurrences:   1    Expected Date:   08/02/2023    Expiration Date:   06/20/2024   Lipid panel    Standing Status:   Future    Number of Occurrences:   1    Expected Date:   08/02/2023    Expiration Date:   06/20/2024    LDL cholesterol, direct    Standing Status:   Future    Number of Occurrences:   1    Expected Date:   08/02/2023    Expiration Date:   06/20/2024    As part of medical decision making discussed management of at least 2 chronic comorbid conditions and reviewed the recent ER documentation from 04/26/2023, EKG from October 2024 independently reviewed, discussed management of lipids and statin intolerance, prescriptions provided, follow-up labs ordered.  Final Medication List:    Meds ordered this encounter  Medications   Bempedoic Acid-Ezetimibe (NEXLIZET) 180-10 MG TABS    Sig: Take 1 tablet by mouth daily at 12 noon.    Dispense:  90 tablet    Refill:  0   atorvastatin (LIPITOR) 20 MG tablet    Sig: Take 1 tablet (20 mg total) by mouth once a week.    Dispense:  90 tablet    Refill:  3    Medications Discontinued During This Encounter  Medication Reason   potassium chloride SA (KLOR-CON M) 20 MEQ tablet Completed Course   atorvastatin (LIPITOR) 20 MG tablet Reorder   Bempedoic Acid-Ezetimibe (NEXLIZET) 180-10 MG TABS Reorder     Current Outpatient Medications:    Ascorbic Acid (VITAMIN C) 500 MG CAPS, Take 1 capsule by mouth daily at 6 (six) AM., Disp: , Rfl:    aspirin EC 81 MG tablet, Take 1 tablet (81 mg total) by mouth daily., Disp: 30 tablet, Rfl: 2   Blood Glucose Monitoring Suppl (ONETOUCH VERIO) w/Device KIT, See admin instructions., Disp: , Rfl:    calcium carbonate (OSCAL) 1500 (600 Ca) MG TABS tablet, Take 1 tablet by mouth daily at 6 (six) AM., Disp: , Rfl:    Elderberry 500 MG CAPS, as directed Orally, Disp: , Rfl:    fluticasone (FLONASE) 50 MCG/ACT nasal spray, Place 1 spray into both nostrils daily as needed., Disp: , Rfl:    glucose blood (ONETOUCH VERIO) test strip, Use to check your blood sugar, Disp: , Rfl:    Ibuprofen-diphenhydrAMINE Cit 200-38 MG TABS, 1 tablet at bedtime as needed Orally Once a day, Disp: , Rfl:    Lancets (ONETOUCH DELICA PLUS  LANCET30G) MISC, , Disp: , Rfl:    Lancets (ONETOUCH ULTRASOFT) lancets, See admin instructions., Disp: , Rfl:    metoprolol succinate (TOPROL-XL) 25 MG 24 hr tablet, Take 1 tablet (25 mg total) by mouth daily., Disp: 90 tablet, Rfl: 0   olmesartan (BENICAR) 20 MG tablet, Take 20 mg by mouth daily., Disp: , Rfl:    Omega-3 Fatty Acids (FISH OIL) 1000 MG CAPS, Take 1 capsule by mouth daily., Disp: , Rfl:    ONETOUCH  VERIO test strip, 1 each daily., Disp: , Rfl:    PROAIR RESPICLICK 108 (90 Base) MCG/ACT AEPB, Inhale 1 puff into the lungs daily., Disp: , Rfl:    atorvastatin (LIPITOR) 20 MG tablet, Take 1 tablet (20 mg total) by mouth once a week., Disp: 90 tablet, Rfl: 3   Bempedoic Acid-Ezetimibe (NEXLIZET) 180-10 MG TABS, Take 1 tablet by mouth daily at 12 noon., Disp: 90 tablet, Rfl: 0  Current Facility-Administered Medications:    0.9 %  sodium chloride infusion, 500 mL, Intravenous, Continuous, Pyrtle, Carie Caddy, MD   0.9 %  sodium chloride infusion, , Intravenous, PRN, Janetta Hora, PA-C  Consent:   N/A  Disposition:   1 year sooner if needed  Her questions and concerns were addressed to her satisfaction. She voices understanding of the recommendations provided during this encounter.    Signed, [name], DO, [facility]  [facility]  [address] [place], [place] [address]

## 2023-06-21 NOTE — Patient Instructions (Addendum)
Medication Instructions:  Your physician has recommended you make the following change in your medication:   START Atorvastatin (Lipitor) 20 mg once daily   START Nexlizet 180-10 mg once daily    *If you need a refill on your cardiac medications before your next appointment, please call your pharmacy*  Lab Work: To be completed in 6 weeks: FASTING lipid panel, direct LDL, and CMP (approximately 08/02/23)  If you have labs (blood work) drawn today and your tests are completely normal, you will receive your results only by: MyChart Message (if you have MyChart) OR A paper copy in the mail If you have any lab test that is abnormal or we need to change your treatment, we will call you to review the results.  Testing/Procedures: None ordered today.  Follow-Up: At [facility], you and your health needs are our priority.  As part of our continuing mission to provide you with exceptional heart care, we have created designated Provider Care Teams.  These Care Teams include your primary Cardiologist (physician) and Advanced Practice Providers (APPs -  Physician Assistants and Nurse Practitioners) who all work together to provide you with the care you need, when you need it.  We recommend signing up for the patient portal called "MyChart".  Sign up information is provided on this After Visit Summary.  MyChart is used to connect with patients for Virtual Visits (Telemedicine).  Patients are able to view lab/test results, encounter notes, upcoming appointments, etc.  Non-urgent messages can be sent to your provider as well.   To learn more about what you can do with MyChart, go to [email or web address].    Your next appointment:   1 year(s)  The format for your next appointment:   In Person  Provider:   [name], DO {

## 2023-06-21 NOTE — Telephone Encounter (Signed)
Patient wants to know if she needs to be fasting for her appointment this morning (12/19).

## 2023-06-28 ENCOUNTER — Encounter: Payer: Self-pay | Admitting: Cardiology

## 2023-07-03 ENCOUNTER — Other Ambulatory Visit (HOSPITAL_COMMUNITY): Payer: Self-pay

## 2023-07-05 ENCOUNTER — Other Ambulatory Visit (HOSPITAL_COMMUNITY): Payer: Self-pay

## 2023-11-02 ENCOUNTER — Telehealth: Payer: Self-pay

## 2023-11-02 NOTE — Telephone Encounter (Signed)
 Attempted to call pt to discuss referral note sent to our office from [facility] urgent care. LMTCB.

## 2023-11-29 ENCOUNTER — Other Ambulatory Visit: Payer: Self-pay | Admitting: Physician Assistant

## 2023-11-29 DIAGNOSIS — Z1231 Encounter for screening mammogram for malignant neoplasm of breast: Secondary | ICD-10-CM

## 2024-01-01 ENCOUNTER — Ambulatory Visit
Admission: RE | Admit: 2024-01-01 | Discharge: 2024-01-01 | Disposition: A | Discharge status: Home / Self Care | Source: Ambulatory Visit | Attending: Physician Assistant | Admitting: Physician Assistant

## 2024-01-01 DIAGNOSIS — Z1231 Encounter for screening mammogram for malignant neoplasm of breast: Secondary | ICD-10-CM

## 2024-02-10 ENCOUNTER — Encounter (HOSPITAL_COMMUNITY): Payer: Self-pay

## 2024-02-10 ENCOUNTER — Other Ambulatory Visit: Payer: Self-pay

## 2024-02-10 ENCOUNTER — Emergency Department (HOSPITAL_COMMUNITY)
Admission: EM | Admit: 2024-02-10 | Discharge: 2024-02-10 | Disposition: A | Discharge status: Home / Self Care | Attending: Emergency Medicine | Admitting: Emergency Medicine

## 2024-02-10 DIAGNOSIS — J45909 Unspecified asthma, uncomplicated: Secondary | ICD-10-CM | POA: Diagnosis not present

## 2024-02-10 DIAGNOSIS — Z7982 Long term (current) use of aspirin: Secondary | ICD-10-CM | POA: Insufficient documentation

## 2024-02-10 DIAGNOSIS — E119 Type 2 diabetes mellitus without complications: Secondary | ICD-10-CM | POA: Diagnosis not present

## 2024-02-10 DIAGNOSIS — R002 Palpitations: Secondary | ICD-10-CM | POA: Diagnosis present

## 2024-02-10 DIAGNOSIS — Z79899 Other long term (current) drug therapy: Secondary | ICD-10-CM | POA: Diagnosis not present

## 2024-02-10 DIAGNOSIS — I159 Secondary hypertension, unspecified: Secondary | ICD-10-CM | POA: Insufficient documentation

## 2024-02-10 DIAGNOSIS — E876 Hypokalemia: Secondary | ICD-10-CM | POA: Diagnosis not present

## 2024-02-10 DIAGNOSIS — I10 Essential (primary) hypertension: Secondary | ICD-10-CM | POA: Diagnosis not present

## 2024-02-10 LAB — CBC WITH DIFFERENTIAL/PLATELET
Abs Immature Granulocytes: 0.02 K/uL (ref 0.00–0.07)
Basophils Absolute: 0 K/uL (ref 0.0–0.1)
Basophils Relative: 1 %
Eosinophils Absolute: 0.1 K/uL (ref 0.0–0.5)
Eosinophils Relative: 1 %
HCT: 37.4 % (ref 36.0–46.0)
Hemoglobin: 12.4 g/dL (ref 12.0–15.0)
Immature Granulocytes: 0 %
Lymphocytes Relative: 27 %
Lymphs Abs: 2.1 K/uL (ref 0.7–4.0)
MCH: 29.3 pg (ref 26.0–34.0)
MCHC: 33.2 g/dL (ref 30.0–36.0)
MCV: 88.4 fL (ref 80.0–100.0)
Monocytes Absolute: 0.6 K/uL (ref 0.1–1.0)
Monocytes Relative: 8 %
Neutro Abs: 4.9 K/uL (ref 1.7–7.7)
Neutrophils Relative %: 63 %
Platelets: 244 K/uL (ref 150–400)
RBC: 4.23 MIL/uL (ref 3.87–5.11)
RDW: 13 % (ref 11.5–15.5)
WBC: 7.8 K/uL (ref 4.0–10.5)
nRBC: 0 % (ref 0.0–0.2)

## 2024-02-10 LAB — TROPONIN I (HIGH SENSITIVITY)
Troponin I (High Sensitivity): 5 ng/L (ref <18)
Troponin I (High Sensitivity): 5 ng/L (ref <18)

## 2024-02-10 LAB — COMPREHENSIVE METABOLIC PANEL WITH GFR
ALT: 15 U/L (ref 0–44)
AST: 25 U/L (ref 15–41)
Albumin: 3.2 g/dL — ABNORMAL LOW (ref 3.5–5.0)
Alkaline Phosphatase: 127 U/L — ABNORMAL HIGH (ref 38–126)
Anion gap: 11 (ref 5–15)
BUN: 10 mg/dL (ref 8–23)
CO2: 25 mmol/L (ref 22–32)
Calcium: 9.5 mg/dL (ref 8.9–10.3)
Chloride: 103 mmol/L (ref 98–111)
Creatinine, Ser: 0.69 mg/dL (ref 0.44–1.00)
GFR, Estimated: >60 mL/min (ref >60)
Glucose, Bld: 116 mg/dL — ABNORMAL HIGH (ref 70–99)
Potassium: 3.4 mmol/L — ABNORMAL LOW (ref 3.5–5.1)
Sodium: 139 mmol/L (ref 135–145)
Total Bilirubin: 0.7 mg/dL (ref 0.0–1.2)
Total Protein: 6.8 g/dL (ref 6.5–8.1)

## 2024-02-10 LAB — MAGNESIUM: Magnesium: 1.6 mg/dL — ABNORMAL LOW (ref 1.7–2.4)

## 2024-02-10 MED ORDER — MAGNESIUM SULFATE 2 GM/50ML IV SOLN
2.0000 g | Freq: Once | INTRAVENOUS | Status: AC
  Administered 2024-02-10: 2 g via INTRAVENOUS
  Filled 2024-02-10: qty 50

## 2024-02-10 MED ORDER — POTASSIUM CHLORIDE CRYS ER 20 MEQ PO TBCR
40.0000 meq | EXTENDED_RELEASE_TABLET | Freq: Once | ORAL | Status: AC
  Administered 2024-02-10: 40 meq via ORAL
  Filled 2024-02-10: qty 2

## 2024-02-10 NOTE — ED Provider Notes (Signed)
 [place] EMERGENCY DEPARTMENT AT [facility] Provider Note   CSN: [id number] Arrival date & time: 02/10/24  9145     Patient presents with: Hypertension   Emily Clarke is a 73 y.o. female.  With a history of asthma, SVT, type 2 diabetes and hypertension who presents to the ED for hypertension.  Patient has reported labile blood pressures at home over the last week.  She has had sensation of palpitations over the last couple days at home as well.  Reports compliance with Toprol -XL which was increased from 25 mg daily to 50 mg daily last year.  No other recent medication changes.  No shortness of breath nausea vomiting fevers chills recent illness.    Hypertension       Prior to Admission medications   Medication Sig Start Date End Date Taking? Authorizing Provider  Ascorbic Acid (VITAMIN C) 500 MG CAPS Take 1 capsule by mouth daily at 6 (six) AM.    [provider]  aspirin  EC 81 MG tablet Take 1 tablet (81 mg total) by mouth daily. 08/23/15   Levander Houston, MD  atorvastatin  (LIPITOR) 20 MG tablet Take 1 tablet (20 mg total) by mouth once a week. 06/21/23   Tolia, Sunit, DO  Blood Glucose Monitoring Suppl (ONETOUCH VERIO) w/Device KIT See admin instructions. 09/25/19   [provider]  calcium  carbonate (OSCAL) 1500 (600 Ca) MG TABS tablet Take 1 tablet by mouth daily at 6 (six) AM.    [provider]  Elderberry 500 MG CAPS as directed Orally    [provider]  fluticasone  (FLONASE ) 50 MCG/ACT nasal spray Place 1 spray into both nostrils daily as needed.    [provider]  glucose blood (ONETOUCH VERIO) test strip Use to check your blood sugar 09/08/20   [provider]  Ibuprofen -diphenhydrAMINE  Cit 200-38 MG TABS 1 tablet at bedtime as needed Orally Once a day    [provider]  Lancets JANETT DELICA PLUS LANCET30G) MISC  05/20/21   [provider]  Lancets (ONETOUCH ULTRASOFT) lancets See admin  instructions. 09/25/19   [provider]  metoprolol  succinate (TOPROL -XL) 25 MG 24 hr tablet Take 1 tablet (25 mg total) by mouth daily. 08/23/15   Levander Houston, MD  olmesartan (BENICAR) 20 MG tablet Take 20 mg by mouth daily.    [provider]  Omega-3 Fatty Acids (FISH OIL) 1000 MG CAPS Take 1 capsule by mouth daily.    [provider]  [facility] VERIO test strip 1 each daily. 06/07/21   [provider]  PROAIR  RESPICLICK 108 (90 Base) MCG/ACT AEPB Inhale 1 puff into the lungs daily. 03/31/21   [provider]    Allergies: Repatha  [evolocumab ], Iodine, Amlodipine  besylate, Atorvastatin , Cyclobenzaprine , and Hydrochlorothiazide     Review of Systems  Updated Vital Signs BP (!) 145/82   Pulse 72   Temp 97.8 F (36.6 C) (Oral)   Resp 17   SpO2 99%   Physical Exam Vitals and nursing note reviewed.  HENT:     Head: Normocephalic and atraumatic.  Eyes:     Pupils: Pupils are equal, round, and reactive to light.  Cardiovascular:     Rate and Rhythm: Normal rate and regular rhythm.  Pulmonary:     Effort: Pulmonary effort is normal.     Breath sounds: Normal breath sounds.  Abdominal:     Palpations: Abdomen is soft.     Tenderness: There is no abdominal tenderness.  Skin:  General: Skin is warm and dry.  Neurological:     Mental Status: She is alert.  Psychiatric:        Mood and Affect: Mood normal.     (all labs ordered are listed, but only abnormal results are displayed) Labs Reviewed  COMPREHENSIVE METABOLIC PANEL WITH GFR - Abnormal; Notable for the following components:      Result Value   Potassium 3.4 (*)    Glucose, Bld 116 (*)    Albumin 3.2 (*)    Alkaline Phosphatase 127 (*)    All other components within normal limits  MAGNESIUM  - Abnormal; Notable for the following components:   Magnesium  1.6 (*)    All other components within normal limits  CBC WITH DIFFERENTIAL/PLATELET  TROPONIN I (HIGH SENSITIVITY)   TROPONIN I (HIGH SENSITIVITY)    EKG: EKG Interpretation Date/Time:  Sunday February 10 2024 09:10:29 EDT Ventricular Rate:  60 PR Interval:  213 QRS Duration:  84 QT Interval:  414 QTC Calculation: 414 R Axis:   50  Text Interpretation: Sinus rhythm Confirmed by Pamella Sharper [phone]) on 02/10/2024 11:08:06 AM  Radiology: No results found.   Procedures   Medications Ordered in the ED  magnesium  sulfate IVPB 2 g 50 mL (0 g Intravenous Stopped 02/10/24 1302)  potassium chloride  SA (KLOR-CON  M) CR tablet 40 mEq (40 mEq Oral Given 02/10/24 1128)    Clinical Course as of 02/10/24 1423  Sun Feb 10, 2024  1421 Initial laboratory workup notable for hypomagnesemia and hypokalemia both of which have been repleted here.  Remainder of her laboratory workup unremarkable.  High-sensitivity troponin of 5 with delta of 5.  Low sufficient for ACS.  No evidence of endorgan damage to do hypertension.  Patient will follow-up with her primary care doctor and continue taking current medications.  Return precautions were discussed in detail with her and her husband at bedside. [MP]    Clinical Course User Index [MP] Pamella Sharper LABOR, DO                                 Medical Decision Making 73 year old female with history as above presenting to the ED given concern for labile hypertension at home.  Has had some palpitations and a funny feeling in her chest no other symptoms.  No recent medication changes.  Blood pressure here on my assessment with systolic in the 140s and slightly elevated diastolic 91.  Well-appearing overall.  Will obtain laboratory workup and EKG to look for any evidence of endorgan damage.  Otherwise patient will follow-up with her PCP discussed long-term management of hypertension  Amount and/or Complexity of Data Reviewed Labs: ordered.  Risk Prescription drug management.        Final diagnoses:  Secondary hypertension    ED Discharge Orders     None           Pamella Sharper LABOR, DO 02/10/24 1423

## 2024-02-10 NOTE — ED Triage Notes (Signed)
 Pt c.o Bp changes over the past week, denies any dizziness or headaches but states she has felt off balance while walking in the past few days and c.o tightness in her left shoulder to her neck. Denies any chest pain  or sob.  Pt states she was recently started on a night time Bp medication and it makes her feel bad, pt unable to name the medication.

## 2024-02-10 NOTE — Discharge Instructions (Signed)
 You were seen in the emerged part given concern for high blood pressures at home Your blood pressure was a little high here but there is no evidence of organ damage Your potassium and magnesium  were low You should have your blood work rechecked by your primary care doctor Continue taking all previous prescribed medications and follow-up with your primary doctor discuss further management of your blood pressure Return to the emergency room for chest pain trouble breathing or any other concerns
